# Patient Record
Sex: Female | Born: 1945 | Race: Black or African American | Hispanic: No | Smoking: Never smoker
Health system: Southern US, Community
[De-identification: ages and names within clinical notes are randomized; demographics above are authoritative.]

## PROBLEM LIST (undated history)

## (undated) DIAGNOSIS — K219 Gastro-esophageal reflux disease without esophagitis: Secondary | ICD-10-CM

## (undated) DIAGNOSIS — N189 Chronic kidney disease, unspecified: Secondary | ICD-10-CM

## (undated) DIAGNOSIS — I2510 Atherosclerotic heart disease of native coronary artery without angina pectoris: Secondary | ICD-10-CM

## (undated) DIAGNOSIS — E785 Hyperlipidemia, unspecified: Secondary | ICD-10-CM

## (undated) DIAGNOSIS — I10 Essential (primary) hypertension: Secondary | ICD-10-CM

## (undated) HISTORY — DX: Chronic kidney disease, unspecified: N18.9

## (undated) HISTORY — DX: Hyperlipidemia, unspecified: E78.5

## (undated) HISTORY — DX: Essential (primary) hypertension: I10

## (undated) HISTORY — PX: CARDIAC SURGERY: SHX584

## (undated) HISTORY — DX: Atherosclerotic heart disease of native coronary artery without angina pectoris: I25.10

---

## 2008-05-21 ENCOUNTER — Ambulatory Visit (HOSPITAL_COMMUNITY): Admission: RE | Admit: 2008-05-21 | Discharge: 2008-05-21 | Payer: Self-pay | Admitting: Nurse Practitioner

## 2008-05-21 ENCOUNTER — Ambulatory Visit: Payer: Self-pay | Admitting: Nurse Practitioner

## 2008-05-21 DIAGNOSIS — L299 Pruritus, unspecified: Secondary | ICD-10-CM | POA: Insufficient documentation

## 2008-05-21 DIAGNOSIS — L851 Acquired keratosis [keratoderma] palmaris et plantaris: Secondary | ICD-10-CM | POA: Insufficient documentation

## 2008-05-21 DIAGNOSIS — M5137 Other intervertebral disc degeneration, lumbosacral region: Secondary | ICD-10-CM | POA: Insufficient documentation

## 2008-05-21 LAB — CONVERTED CEMR LAB
Bilirubin Urine: NEGATIVE
Blood Glucose, Fingerstick: 547
Blood in Urine, dipstick: NEGATIVE
Glucose, Urine, Semiquant: >=1000
Ketones, urine, test strip: NEGATIVE
pH: 6

## 2008-05-22 ENCOUNTER — Ambulatory Visit: Payer: Self-pay | Admitting: *Deleted

## 2008-05-22 LAB — CONVERTED CEMR LAB
ALT: 14 units/L (ref 0–35)
AST: 13 units/L (ref 0–37)
Alkaline Phosphatase: 81 units/L (ref 39–117)
BUN: 15 mg/dL (ref 6–23)
CO2: 23 meq/L (ref 19–32)
Calcium: 9.6 mg/dL (ref 8.4–10.5)
Chloride: 87 meq/L — ABNORMAL LOW (ref 96–112)
Eosinophils Absolute: 0.3 10*3/uL (ref 0.0–0.7)
Glucose, Bld: 646 mg/dL (ref 70–99)
Lymphocytes Relative: 36 % (ref 12–46)
MCV: 76.9 fL — ABNORMAL LOW (ref 78.0–100.0)
Potassium: 4.6 meq/L (ref 3.5–5.3)
RBC: 5.33 M/uL — ABNORMAL HIGH (ref 3.87–5.11)
Sodium: 132 meq/L — ABNORMAL LOW (ref 135–145)
WBC: 5.5 10*3/uL (ref 4.0–10.5)

## 2008-06-08 ENCOUNTER — Ambulatory Visit: Payer: Self-pay | Admitting: Nurse Practitioner

## 2008-06-08 DIAGNOSIS — K5900 Constipation, unspecified: Secondary | ICD-10-CM | POA: Insufficient documentation

## 2008-06-08 LAB — CONVERTED CEMR LAB
Bilirubin Urine: NEGATIVE
Blood in Urine, dipstick: NEGATIVE
Ketones, urine, test strip: NEGATIVE
Nitrite: NEGATIVE
Protein, U semiquant: NEGATIVE
Specific Gravity, Urine: 1.02
Urobilinogen, UA: 0.2
WBC Urine, dipstick: NEGATIVE
pH: 5

## 2008-06-12 ENCOUNTER — Ambulatory Visit: Payer: Self-pay | Admitting: Nurse Practitioner

## 2008-06-15 ENCOUNTER — Encounter (INDEPENDENT_AMBULATORY_CARE_PROVIDER_SITE_OTHER): Payer: Self-pay | Admitting: Nurse Practitioner

## 2008-06-17 ENCOUNTER — Encounter (INDEPENDENT_AMBULATORY_CARE_PROVIDER_SITE_OTHER): Payer: Self-pay | Admitting: Nurse Practitioner

## 2008-07-09 ENCOUNTER — Ambulatory Visit: Payer: Self-pay | Admitting: Nurse Practitioner

## 2008-07-09 LAB — CONVERTED CEMR LAB: Blood Glucose, Fingerstick: 113

## 2008-07-10 ENCOUNTER — Encounter (INDEPENDENT_AMBULATORY_CARE_PROVIDER_SITE_OTHER): Payer: Self-pay | Admitting: Nurse Practitioner

## 2008-07-10 LAB — CONVERTED CEMR LAB
Cholesterol: 335 mg/dL — ABNORMAL HIGH (ref 0–200)
Triglycerides: 186 mg/dL — ABNORMAL HIGH (ref <150)

## 2008-07-24 ENCOUNTER — Encounter (INDEPENDENT_AMBULATORY_CARE_PROVIDER_SITE_OTHER): Payer: Self-pay | Admitting: Nurse Practitioner

## 2008-08-20 ENCOUNTER — Telehealth (INDEPENDENT_AMBULATORY_CARE_PROVIDER_SITE_OTHER): Payer: Self-pay | Admitting: Nurse Practitioner

## 2008-09-14 ENCOUNTER — Ambulatory Visit: Payer: Self-pay | Admitting: Nurse Practitioner

## 2008-09-14 DIAGNOSIS — I10 Essential (primary) hypertension: Secondary | ICD-10-CM | POA: Insufficient documentation

## 2008-09-14 DIAGNOSIS — E7800 Pure hypercholesterolemia, unspecified: Secondary | ICD-10-CM

## 2008-09-14 LAB — CONVERTED CEMR LAB
ALT: 15 units/L (ref 0–35)
AST: 16 units/L (ref 0–37)
Albumin: 4.5 g/dL (ref 3.5–5.2)
Alkaline Phosphatase: 64 units/L (ref 39–117)
Bilirubin, Direct: <0.1 mg/dL (ref 0.0–0.3)
Blood Glucose, Fingerstick: 91
Cholesterol, target level: 200 mg/dL
Cholesterol: 244 mg/dL — ABNORMAL HIGH (ref 0–200)
HDL goal, serum: 40 mg/dL
HDL: 63 mg/dL (ref >39)
Hgb A1c MFr Bld: 6.7 %
LDL Cholesterol: 152 mg/dL — ABNORMAL HIGH (ref 0–99)
LDL Goal: 100 mg/dL
Total Bilirubin: 0.6 mg/dL (ref 0.3–1.2)
Total CHOL/HDL Ratio: 3.9
Total Protein: 8.4 g/dL — ABNORMAL HIGH (ref 6.0–8.3)
Triglycerides: 143 mg/dL (ref <150)
VLDL: 29 mg/dL (ref 0–40)

## 2008-09-15 ENCOUNTER — Encounter (INDEPENDENT_AMBULATORY_CARE_PROVIDER_SITE_OTHER): Payer: Self-pay | Admitting: Nurse Practitioner

## 2008-09-22 ENCOUNTER — Encounter (INDEPENDENT_AMBULATORY_CARE_PROVIDER_SITE_OTHER): Payer: Self-pay | Admitting: Nurse Practitioner

## 2008-10-05 ENCOUNTER — Ambulatory Visit: Payer: Self-pay | Admitting: Nurse Practitioner

## 2008-11-26 ENCOUNTER — Ambulatory Visit: Payer: Self-pay | Admitting: Physician Assistant

## 2008-11-26 DIAGNOSIS — R011 Cardiac murmur, unspecified: Secondary | ICD-10-CM | POA: Insufficient documentation

## 2008-11-26 DIAGNOSIS — R3911 Hesitancy of micturition: Secondary | ICD-10-CM | POA: Insufficient documentation

## 2008-11-26 LAB — CONVERTED CEMR LAB
Glucose, Urine, Semiquant: NEGATIVE
Ketones, urine, test strip: NEGATIVE
Nitrite: NEGATIVE
Specific Gravity, Urine: <1.005
Urobilinogen, UA: 0.2
pH: 7

## 2008-12-30 ENCOUNTER — Ambulatory Visit: Payer: Self-pay | Admitting: Nurse Practitioner

## 2008-12-30 ENCOUNTER — Telehealth (INDEPENDENT_AMBULATORY_CARE_PROVIDER_SITE_OTHER): Payer: Self-pay | Admitting: Nurse Practitioner

## 2008-12-30 DIAGNOSIS — K219 Gastro-esophageal reflux disease without esophagitis: Secondary | ICD-10-CM | POA: Insufficient documentation

## 2008-12-30 LAB — CONVERTED CEMR LAB: Blood Glucose, AC Bkfst: 107 mg/dL

## 2009-01-07 ENCOUNTER — Ambulatory Visit (HOSPITAL_COMMUNITY): Admission: RE | Admit: 2009-01-07 | Discharge: 2009-01-07 | Payer: Self-pay | Admitting: Internal Medicine

## 2009-01-07 ENCOUNTER — Encounter (INDEPENDENT_AMBULATORY_CARE_PROVIDER_SITE_OTHER): Payer: Self-pay | Admitting: Internal Medicine

## 2009-01-15 ENCOUNTER — Encounter (INDEPENDENT_AMBULATORY_CARE_PROVIDER_SITE_OTHER): Payer: Self-pay | Admitting: *Deleted

## 2009-01-22 ENCOUNTER — Ambulatory Visit: Payer: Self-pay | Admitting: Nurse Practitioner

## 2009-01-22 DIAGNOSIS — E1169 Type 2 diabetes mellitus with other specified complication: Secondary | ICD-10-CM | POA: Insufficient documentation

## 2009-01-22 LAB — CONVERTED CEMR LAB
Blood Glucose, AC Bkfst: 74 mg/dL
Hgb A1c MFr Bld: 6.3 %

## 2009-01-25 DIAGNOSIS — A048 Other specified bacterial intestinal infections: Secondary | ICD-10-CM | POA: Insufficient documentation

## 2009-01-25 LAB — CONVERTED CEMR LAB
ALT: 16 units/L (ref 0–35)
Calcium: 9.8 mg/dL (ref 8.4–10.5)
Chloride: 101 meq/L (ref 96–112)
Creatinine, Ser: 0.87 mg/dL (ref 0.40–1.20)
Glucose, Bld: 77 mg/dL (ref 70–99)
VLDL: 22 mg/dL (ref 0–40)

## 2009-01-29 ENCOUNTER — Ambulatory Visit: Payer: Self-pay | Admitting: Nurse Practitioner

## 2009-04-23 ENCOUNTER — Ambulatory Visit: Payer: Self-pay | Admitting: Nurse Practitioner

## 2009-04-23 LAB — CONVERTED CEMR LAB: Hgb A1c MFr Bld: 5.8 %

## 2009-07-22 ENCOUNTER — Ambulatory Visit: Payer: Self-pay | Admitting: Nurse Practitioner

## 2009-07-22 LAB — CONVERTED CEMR LAB
Basophils Absolute: 0 10*3/uL (ref 0.0–0.1)
Basophils Relative: 1 % (ref 0–1)
Cholesterol: 208 mg/dL — ABNORMAL HIGH (ref 0–200)
Eosinophils Absolute: 0.7 10*3/uL (ref 0.0–0.7)
HCT: 36.6 % (ref 36.0–46.0)
Lymphocytes Relative: 52 % — ABNORMAL HIGH (ref 12–46)
MCV: 81 fL (ref 78.0–100.0)
Monocytes Absolute: 0.3 10*3/uL (ref 0.1–1.0)
Neutro Abs: 1.8 10*3/uL (ref 1.7–7.7)
RBC: 4.52 M/uL (ref 3.87–5.11)
RDW: 13.6 % (ref 11.5–15.5)
Triglycerides: 168 mg/dL — ABNORMAL HIGH (ref <150)
VLDL: 34 mg/dL (ref 0–40)

## 2009-07-23 ENCOUNTER — Encounter (INDEPENDENT_AMBULATORY_CARE_PROVIDER_SITE_OTHER): Payer: Self-pay | Admitting: Nurse Practitioner

## 2009-08-06 ENCOUNTER — Ambulatory Visit (HOSPITAL_COMMUNITY): Admission: RE | Admit: 2009-08-06 | Discharge: 2009-08-06 | Payer: Self-pay | Admitting: Internal Medicine

## 2009-10-22 ENCOUNTER — Ambulatory Visit: Payer: Self-pay | Admitting: Nurse Practitioner

## 2009-10-22 LAB — CONVERTED CEMR LAB: Hgb A1c MFr Bld: 7 %

## 2010-01-24 ENCOUNTER — Ambulatory Visit: Payer: Self-pay | Admitting: Nurse Practitioner

## 2010-01-24 LAB — CONVERTED CEMR LAB
Albumin: 4.6 g/dL (ref 3.5–5.2)
Basophils Relative: 0 % (ref 0–1)
Calcium: 9.2 mg/dL (ref 8.4–10.5)
Creatinine, Ser: 1.1 mg/dL (ref 0.40–1.20)
Eosinophils Relative: 18 % — ABNORMAL HIGH (ref 0–5)
Glucose, Bld: 91 mg/dL (ref 70–99)
HCT: 36.5 % (ref 36.0–46.0)
Hemoglobin: 11.7 g/dL — ABNORMAL LOW (ref 12.0–15.0)
Hgb A1c MFr Bld: 7 % — ABNORMAL HIGH (ref <5.7)
LDL Cholesterol: 126 mg/dL — ABNORMAL HIGH (ref 0–99)
Lymphocytes Relative: 55 % — ABNORMAL HIGH (ref 12–46)
Lymphs Abs: 2.9 10*3/uL (ref 0.7–4.0)
MCHC: 32.1 g/dL (ref 30.0–36.0)
MCV: 78.8 fL (ref 78.0–100.0)
Monocytes Relative: 2 % — ABNORMAL LOW (ref 3–12)
Platelets: 195 10*3/uL (ref 150–400)
RDW: 13.9 % (ref 11.5–15.5)
Total CHOL/HDL Ratio: 4.7
Total Protein: 7.6 g/dL (ref 6.0–8.3)
Triglycerides: 143 mg/dL (ref <150)
WBC: 5.3 10*3/uL (ref 4.0–10.5)

## 2010-01-25 ENCOUNTER — Encounter (INDEPENDENT_AMBULATORY_CARE_PROVIDER_SITE_OTHER): Payer: Self-pay | Admitting: Nurse Practitioner

## 2010-04-26 ENCOUNTER — Encounter (INDEPENDENT_AMBULATORY_CARE_PROVIDER_SITE_OTHER): Payer: Self-pay | Admitting: Nurse Practitioner

## 2010-04-26 ENCOUNTER — Ambulatory Visit: Payer: Self-pay | Admitting: Internal Medicine

## 2010-04-26 LAB — CONVERTED CEMR LAB
Bilirubin Urine: NEGATIVE
Blood in Urine, dipstick: NEGATIVE
Ketones, urine, test strip: NEGATIVE
Nitrite: NEGATIVE
Urobilinogen, UA: 0.2
pH: 6

## 2010-06-14 NOTE — Letter (Signed)
Summary: Lipid Letter  [facility]  [address] [place], [place] [address]   Phone: [phone]  Fax: [phone]    07/23/2009  Amanda [name] [address] [place], [place]  [address]  Dear Amanda [name]:  We have carefully reviewed your last lipid profile from 07/22/2009 and the results are noted below with a summary of recommendations for lipid management.    Cholesterol:       208     Goal: less than 200   HDL "good" Cholesterol:   50     Goal: greater than 40   LDL "bad" Cholesterol:   124     Goal: less than 70   Triglycerides:       168     Goal: less than 150     Your cholesterol labs are slightly elevated.  Continue to take your cholesterol medications.  Avoid fried fatty foods.  Drink low fat milk and choose other low fat options. Your Hgba1c =  _______.  This is slightly higher than when previously checked.  Keep taking your blood sugar medications as ordered.    Current Medications: 1)    Lac-hydrin 12 % Lotn (Ammonium lactate) .... Apply to affected skin topically two times a day 2)    Glucometer Elite Classic  Kit (Blood glucose monitoring suppl) .... Dispense 1 glucometer to check blood sugar twice daily 3)    Metformin Hcl 500 Mg Tabs (Metformin hcl) .... One tablet by mouth two times a day 4)    Glucotrol Xl 5 Mg Xr24h-tab (Glipizide) .... One tablet by mouth daily for blood sugar 5)    Gnp Test  Strp (Glucose blood) .... Check blood sugar twice daily 6)    Ibuprofen 600 Mg Tabs (Ibuprofen) .Marland Kitchen.. 1 tablet by mouth two times a day as needed for back pain 7)    Bayer Aspirin Ec Low Dose 81 Mg Tbec (Aspirin) .Marland Kitchen.. 1 tablet by mouth daily for circulation 8)    Lipitor 40 Mg Tabs (Atorvastatin calcium) .Marland Kitchen.. 1 tablet by mouth nightly for cholesterol 9)    Proventil Hfa 108 (90 Base) Mcg/act Aers (Albuterol sulfate) .... Two puffs every 6 hours as needed for shortness of breath 10)    Nexium 40 Mg Cpdr (Esomeprazole magnesium) .... One tablet by mouth daily  before breakfast 11)    Benazepril Hcl 10 Mg Tabs (Benazepril hcl) .... One tablet by mouth daily for blood pressure **pharmacy - d/c lisinopril**  If you have any questions, please call. We appreciate being able to work with you.   Sincerely,    [facility] Aurora Mask FNP

## 2010-06-14 NOTE — Letter (Signed)
Summary: TEST ORDER FORM//MAMMOGRAM//APPT DATE & TIME  TEST ORDER FORM//MAMMOGRAM//APPT DATE & TIME   Imported By: Roland Earl 09/21/2009 14:27:39  _____________________________________________________________________  External Attachment:    Type:   Image     Comment:   External Document

## 2010-06-14 NOTE — Assessment & Plan Note (Signed)
Summary: Diabetes   Vital Signs:  Patient profile:   65 year old female Menstrual status:  postmenopausal Weight:      119 pounds Temp:     97.3 degrees F oral BP sitting:   140 / 88  (left arm) Cuff size:   regular  Vitals Entered By: Mordecai Maes CMA (January 24, 2010 8:40 AM) CC: pt here 3 mo f/u(fasting) medications reviewed///////, Hypertension Management, Lipid Management, Abdominal Pain Is Patient Diabetic? Yes CBG Result 115 CBG Device ID [id number]  Does patient need assistance? Functional Status Self care Ambulation Normal     Menstrual Status postmenopausal Last PAP Result refused  Diabetic Foot Exam Foot Inspection Is there a foot ulcer now?              No Can the patient see the bottom of their feet?          Yes Are the shoes appropriate in style and fit?          Yes Is there swelling or an abnormal foot shape?          No Are the toenails long?                No Are the toenails thick?                No Are the toenails ingrown?              No Is there heavy callous build-up?              No Is there pain in the calf muscle (Intermittent claudication) when walking?    NoIs there a claw toe deformity?              No Is there elevated skin temperature?            No Is there limited ankle dorsiflexion?            No Is there foot or ankle muscle weakness?            No  Diabetic Foot Care Education Patient educated on appropriate care of diabetic feet.  Pulse Check          Right Foot          Left Foot Dorsalis Pedis:        normal            normal    CC:  pt here 3 mo f/u(fasting) medications reviewed///////, Hypertension Management, Lipid Management, and Abdominal Pain.  History of Present Illness:   Pt into the office today for f/u on diabetes.  Son present today with pt who serves as interpreter Pt presents today with all her medications  Diabetes Management History:      The patient is a 65 years old female who comes in for evaluation of  Type 2 Diabetes Mellitus.  She has not been enrolled in the "Diabetic Education Program".  She states understanding of dietary principles and is following her diet appropriately.  No sensory loss is reported.  Self foot exams are being performed.  She is checking home blood sugars.  She says that she is not exercising regularly.        Hypoglycemic symptoms are not occurring.  No hyperglycemic symptoms are reported.        There are no symptoms to suggest diabetic complications.  No changes have been made to her treatment plan since last visit.    Dyspepsia History:  She has no alarm features of dyspepsia including no history of melena, hematochezia, dysphagia, persistent vomiting, or involuntary weight loss > 5%.  There is a prior history of GERD.  The patient does not have a prior history of documented ulcer disease.  The dominant symptom is heartburn or acid reflux.  She has no history of a positive H. Pylori serology.  No previous upper endoscopy has been done.    Hypertension History:      She denies headache, chest pain, and palpitations.  She notes no problems with any antihypertensive medication side effects.  Pt was started on Benicar 5mg  during the last visit.  She has already taken already today. Son reports that BP has been slightly elevated since starting the benicar.        Positive major cardiovascular risk factors include female age 34 years old or older, diabetes, hyperlipidemia, and hypertension.  Negative major cardiovascular risk factors include non-tobacco-user status.        Further assessment for target organ damage reveals no history of ASHD, cardiac end-organ damage (CHF/LVH), stroke/TIA, peripheral vascular disease, renal insufficiency, or hypertensive retinopathy.    Lipid Management History:      Positive NCEP/ATP III risk factors include female age 36 years old or older, diabetes, and hypertension.  Negative NCEP/ATP III risk factors include no history of early menopause  without estrogen hormone replacement, non-tobacco-user status, no ASHD (atherosclerotic heart disease), no prior stroke/TIA, no peripheral vascular disease, and no history of aortic aneurysm.        The patient states that she knows about the "Therapeutic Lifestyle Change" diet.  Her compliance with the TLC diet is fair.  The patient does not know about adjunctive measures for cholesterol lowering.  Adjunctive measures started by the patient include folic acid.  She expresses no side effects from her lipid-lowering medication.  The patient denies any symptoms to suggest myopathy or liver disease.     Habits & Providers  Alcohol-Tobacco-Diet     Alcohol drinks/day: 0     Tobacco Status: never  Exercise-Depression-Behavior     Does Patient Exercise: no     Depression Counseling: not indicated; screening negative for depression     Drug Use: no     Seat Belt Use: 100     Sun Exposure: occasionally  Current Medications (verified): 1)  Lac-Hydrin 12 % Lotn (Ammonium Lactate) .... Apply To Affected Skin Topically Two Times A Day 2)  Glucometer Elite Classic  Kit (Blood Glucose Monitoring Suppl) .... Dispense 1 Glucometer To Check Blood Sugar Twice Daily 3)  Metformin Hcl 500 Mg Tabs (Metformin Hcl) .... One Tablet By Mouth Three Times A Day For Diabetes 4)  Glucotrol Xl 5 Mg Xr24h-Tab (Glipizide) .... One Tablet By Mouth Daily For Blood Sugar 5)  Gnp Test  Strp (Glucose Blood) .... Check Blood Sugar Twice Daily 6)  Ibuprofen 600 Mg Tabs (Ibuprofen) .Marland Kitchen.. 1 Tablet By Mouth Two Times A Day As Needed For Back Pain 7)  Bayer Aspirin Ec Low Dose 81 Mg Tbec (Aspirin) .Marland Kitchen.. 1 Tablet By Mouth Daily For Circulation 8)  Lipitor 40 Mg Tabs (Atorvastatin Calcium) .Marland Kitchen.. 1 Tablet By Mouth Nightly For Cholesterol 9)  Proventil Hfa 108 (90 Base) Mcg/act Aers (Albuterol Sulfate) .... Two Puffs Every 6 Hours As Needed For Shortness of Breath 10)  Nexium 40 Mg Cpdr (Esomeprazole Magnesium) .... One Tablet By Mouth  Daily Before Breakfast 11)  Benicar 20 Mg Tabs (Olmesartan Medoxomil) .... One Tablet By Mouth Daily  For Blood Pressure  Allergies: No Known Drug Allergies  Review of Systems CV:  Denies chest pain or discomfort. Resp:  Denies cough; ACE stopped during last visit and cough has improved. GI:  Complains of constipation; denies abdominal pain, nausea, and vomiting; pt took metamucil previously with good results. admits that she sporatically eats high fiber foods.  Physical Exam  General:  alert.   Head:  normocephalic.   Lungs:  normal breath sounds.   Heart:  normal rate, regular rhythm, and grade  3/6 HSM.   Abdomen:  non-tender.   Msk:  up to the exam table Neurologic:  alert & oriented X3.   Psych:  Oriented X3.    Diabetes Management Exam:       Nails:          Left foot: normal          Right foot: normal   Impression & Recommendations:  Problem # 1:  DIABETES MELLITUS (ICD-250.00) will check Hbga1c today (send out) Her updated medication list for this problem includes:    Metformin Hcl 500 Mg Tabs (Metformin hcl) ..... One tablet by mouth three times a day for diabetes    Glucotrol Xl 5 Mg Xr24h-tab (Glipizide) ..... One tablet by mouth daily for blood sugar    Bayer Aspirin Ec Low Dose 81 Mg Tbec (Aspirin) .Marland Kitchen... 1 tablet by mouth daily for circulation    Benicar 20 Mg Tabs (Olmesartan medoxomil) ..... One tablet by mouth daily for blood pressure  Orders: Capillary Blood Glucose/CBG (82948) T- Hemoglobin A1C [id number])  Problem # 2:  HYPERTENSION, BENIGN ESSENTIAL (ICD-401.1) BP is still slightly elevated  will increase the dose to 20mg  by mouth daily Her updated medication list for this problem includes:    Benicar 20 Mg Tabs (Olmesartan medoxomil) ..... One tablet by mouth daily for blood pressure  Orders: T-Comprehensive Metabolic Panel (A999333) T-CBC w/Diff [id number])  Problem # 3:  HYPERCHOLESTEROLEMIA (ICD-272.0) will check labs today Her  updated medication list for this problem includes:    Lipitor 40 Mg Tabs (Atorvastatin calcium) .Marland Kitchen... 1 tablet by mouth nightly for cholesterol  Orders: T-Lipid Profile [id number])  Problem # 4:  COUGH (ICD-786.2) has improved with d/c of ACE  Problem # 5:  CONSTIPATION (ICD-564.00) advised high fiber diet water and exercise  Complete Medication List: 1)  Lac-hydrin 12 % Lotn (Ammonium lactate) .... Apply to affected skin topically two times a day 2)  Glucometer Elite Classic Kit (Blood glucose monitoring suppl) .... Dispense 1 glucometer to check blood sugar twice daily 3)  Metformin Hcl 500 Mg Tabs (Metformin hcl) .... One tablet by mouth three times a day for diabetes 4)  Glucotrol Xl 5 Mg Xr24h-tab (Glipizide) .... One tablet by mouth daily for blood sugar 5)  Gnp Test Strp (Glucose blood) .... Check blood sugar twice daily 6)  Ibuprofen 600 Mg Tabs (Ibuprofen) .Marland Kitchen.. 1 tablet by mouth two times a day as needed for back pain 7)  Bayer Aspirin Ec Low Dose 81 Mg Tbec (Aspirin) .Marland Kitchen.. 1 tablet by mouth daily for circulation 8)  Lipitor 40 Mg Tabs (Atorvastatin calcium) .Marland Kitchen.. 1 tablet by mouth nightly for cholesterol 9)  Proventil Hfa 108 (90 Base) Mcg/act Aers (Albuterol sulfate) .... Two puffs every 6 hours as needed for shortness of breath 10)  Nexium 40 Mg Cpdr (Esomeprazole magnesium) .... One tablet by mouth daily before breakfast 11)  Benicar 20 Mg Tabs (Olmesartan medoxomil) .... One tablet by mouth daily for blood pressure  Diabetes Management Assessment/Plan:      The following lipid goals have been established for the patient: Total cholesterol goal of 200; LDL cholesterol goal of 100; HDL cholesterol goal of 40; Triglyceride goal of 150.  Her blood pressure goal is < 130/80.    Dyspepsia Assessment/Plan:  Step Therapy: GERD Treatment Protocols:    Step-1: started  Hypertension Assessment/Plan:      The patient's hypertensive risk group is category C: Target organ damage  and/or diabetes.  Her calculated 10 year risk of coronary heart disease is 24 %.  Today's blood pressure is 140/88.  Her blood pressure goal is < 130/80.  Lipid Assessment/Plan:      Based on NCEP/ATP III, the patient's risk factor category is "history of diabetes".  The patient's lipid goals are as follows: Total cholesterol goal is 200; LDL cholesterol goal is 100; HDL cholesterol goal is 40; Triglyceride goal is 150.  Her LDL cholesterol goal has not been met.  Secondary causes for hyperlipidemia have been ruled out.  She has been counseled on adjunctive measures for lowering her cholesterol.    Diabetic Foot Exam Foot Inspection Is there a foot ulcer now?              No Can the patient see the bottom of their feet?          Yes Are the shoes appropriate in style and fit?          Yes Is there swelling or an abnormal foot shape?          No Are the toenails long?                No Are the toenails thick?                No Are the toenails ingrown?              No Is there heavy callous build-up?              No Is there pain in the calf muscle (Intermittent claudication) when walking?    NoIs there a claw toe deformity?              No Is there elevated skin temperature?            No Is there limited ankle dorsiflexion?            No Is there foot or ankle muscle weakness?            No  Diabetic Foot Care Education Patient educated on appropriate care of diabetic feet.  Pulse Check          Right Foot          Left Foot Dorsalis Pedis:        normal            normal     Patient Instructions: 1)  [place] a nurse visit in 6 weeks for a flu vaccine. 2)  Schedule a follow up visit in 3 months with n.martin,fnp for diabetes.   3)  Your labs will be checked today and you will be notified of the results 4)  constipation - you may need to start a stool softner daily.  However try to add more high fiber foods to your diet such as raisins, prunes, oatmeal, apples,  bran. Prescriptions: NEXIUM 40 MG CPDR (ESOMEPRAZOLE MAGNESIUM) One tablet by mouth daily before breakfast  #30 x 5   Entered  and Authorized by:   Aurora Mask FNP   Signed by:   Aurora Mask FNP on 01/24/2010   Method used:   Faxed to ...       [facility] (retail)       [place], [place]  [address]       Ph: [phone] [place]       Fax: [phone]   RxID:   [id number] GNP TEST  STRP (GLUCOSE BLOOD) check blood sugar twice daily  #100 x 5   Entered and Authorized by:   Aurora Mask FNP   Signed by:   Aurora Mask FNP on 01/24/2010   Method used:   Faxed to ...       [facility] (retail)       [place], [place]  [address]       Ph: [phone] [place]       Fax: [phone]   RxID:   [id number] BENICAR 20 MG TABS (OLMESARTAN MEDOXOMIL) One tablet by mouth daily for blood pressure  #30 x 5   Entered and Authorized by:   Aurora Mask FNP   Signed by:   Aurora Mask FNP on 01/24/2010   Method used:   Faxed to ...       [facility] (retail)       [place], [place]  [address]       Ph: [phone] [place]       Fax: [phone]   RxID:   [id number]

## 2010-06-14 NOTE — Assessment & Plan Note (Signed)
Summary: Diabetes   Vital Signs:  Patient profile:   65 year old female Height:      56 inches Weight:      117 pounds BMI:     26.33 Temp:     98.2 degrees F oral Pulse rate:   73 / minute Pulse rhythm:   regular Resp:     18 per minute BP sitting:   135 / 84  (left arm) Cuff size:   regular  Vitals Entered By: [place] Shannon (October 22, 2009 8:19 AM)  Nutrition Counseling: Patient's BMI is greater than 25 and therefore counseled on weight management options. CC: three month f/u..., Hypertension Management, Lipid Management, Abdominal Pain Is Patient Diabetic? Yes Pain Assessment Patient in pain? no      CBG Result 115  Does patient need assistance? Functional Status Self care Ambulation Normal     Last PAP Result refused   CC:  three month f/u..., Hypertension Management, Lipid Management, and Abdominal Pain.  History of Present Illness:  Pt into the office for 3 month f/u - diabetes  All medications into the office today with pt and reviewed with son.  Son present with pt to interpret    Diabetes Management History:      The patient is a 66 years old female who comes in for evaluation of Type 2 Diabetes Mellitus.  She has not been enrolled in the "Diabetic Education Program".  She states lack of understanding of dietary principles and is not following her diet appropriately.  No sensory loss is reported.  Self foot exams are not being performed.  She is checking home blood sugars.  She says that she is not exercising regularly.        Hypoglycemic symptoms are not occurring.  No hyperglycemic symptoms are reported.  Other comments include: son is checking her blood sugar daily at home and realizes that she has some elevated blood sugars at home from time to time.  Marland Kitchen        No changes have been made to her treatment plan since last visit.    Dyspepsia History:      There is a prior history of GERD.  The patient does not have a prior history of documented ulcer  disease.  She has no history of a positive H. Pylori serology.  No previous upper endoscopy has been done.    Hypertension History:      She denies headache, chest pain, and palpitations.        Positive major cardiovascular risk factors include female age 21 years old or older, diabetes, hyperlipidemia, and hypertension.  Negative major cardiovascular risk factors include non-tobacco-user status.        Further assessment for target organ damage reveals no history of ASHD, cardiac end-organ damage (CHF/LVH), stroke/TIA, peripheral vascular disease, renal insufficiency, or hypertensive retinopathy.    Lipid Management History:      Positive NCEP/ATP III risk factors include female age 53 years old or older, diabetes, and hypertension.  Negative NCEP/ATP III risk factors include non-tobacco-user status, no ASHD (atherosclerotic heart disease), no prior stroke/TIA, no peripheral vascular disease, and no history of aortic aneurysm.        The patient states that she knows about the "Therapeutic Lifestyle Change" diet.  Her compliance with the TLC diet is fair.  She expresses no side effects from her lipid-lowering medication.  Comments include: pt is taking lipitor as ordered.  The patient denies any symptoms to suggest myopathy or  liver disease.       Habits & Providers  Alcohol-Tobacco-Diet     Alcohol drinks/day: 0     Tobacco Status: never  Exercise-Depression-Behavior     Does Patient Exercise: no     Have you felt down or hopeless? no     Have you felt little pleasure in things? no     Depression Counseling: not indicated; screening negative for depression     Drug Use: no     Seat Belt Use: 100     Sun Exposure: occasionally  Current Medications (verified): 1)  Lac-Hydrin 12 % Lotn (Ammonium Lactate) .... Apply To Affected Skin Topically Two Times A Day 2)  Glucometer Elite Classic  Kit (Blood Glucose Monitoring Suppl) .... Dispense 1 Glucometer To Check Blood Sugar Twice Daily 3)   Metformin Hcl 500 Mg Tabs (Metformin Hcl) .... One Tablet By Mouth Three Times A Day For Diabetes 4)  Glucotrol Xl 5 Mg Xr24h-Tab (Glipizide) .... One Tablet By Mouth Daily For Blood Sugar 5)  Gnp Test  Strp (Glucose Blood) .... Check Blood Sugar Twice Daily 6)  Ibuprofen 600 Mg Tabs (Ibuprofen) .Marland Kitchen.. 1 Tablet By Mouth Two Times A Day As Needed For Back Pain 7)  Bayer Aspirin Ec Low Dose 81 Mg Tbec (Aspirin) .Marland Kitchen.. 1 Tablet By Mouth Daily For Circulation 8)  Lipitor 40 Mg Tabs (Atorvastatin Calcium) .Marland Kitchen.. 1 Tablet By Mouth Nightly For Cholesterol 9)  Proventil Hfa 108 (90 Base) Mcg/act Aers (Albuterol Sulfate) .... Two Puffs Every 6 Hours As Needed For Shortness of Breath 10)  Nexium 40 Mg Cpdr (Esomeprazole Magnesium) .... One Tablet By Mouth Daily Before Breakfast 11)  Benicar 5 Mg Tabs (Olmesartan Medoxomil) .... One Tablet By Mouth Daily For Kidneys/blood Pressure **d/c Ace Inhibitors - Cough**  Allergies (verified): No Known Drug Allergies  Review of Systems CV:  Denies chest pain or discomfort. Resp:  Complains of cough; Pt still has a cough despite changing her from lisinopril to benazepril. Non-productive pt has tried meds for allergies and GERD without resolution of cough. GI:  Denies abdominal pain, nausea, and vomiting. MS:  Complains of low back pain.  Physical Exam  General:  alert.   Head:  normocephalic.   Ears:  ear piercing(s) noted.   Lungs:  normal breath sounds.   Heart:  normal rate and regular rhythm.   Abdomen:  normal bowel sounds.   Neurologic:  alert & oriented X3.    Diabetes Management Exam:       Nails:          Left foot: thickened          Right foot: thickened   Impression & Recommendations:  Problem # 1:  DIABETES MELLITUS (ICD-250.00) Hgba1c = 7.0 Son has noticed some increase in blood sugars at home so will increase metformin to three times a day  Pt also continues to have cough - will change from ACE as pt has tried and failed on two. Will  transition to benicar (rx sent to [facility]) The following medications were removed from the medication list:    Benazepril Hcl 10 Mg Tabs (Benazepril hcl) ..... One tablet by mouth daily for blood pressure **pharmacy - d/c lisinopril** Her updated medication list for this problem includes:    Metformin Hcl 500 Mg Tabs (Metformin hcl) ..... One tablet by mouth three times a day for diabetes    Glucotrol Xl 5 Mg Xr24h-tab (Glipizide) ..... One tablet by mouth daily for blood sugar  Bayer Aspirin Ec Low Dose 81 Mg Tbec (Aspirin) .Marland Kitchen... 1 tablet by mouth daily for circulation    Benicar 5 Mg Tabs (Olmesartan medoxomil) ..... One tablet by mouth daily for kidneys/blood pressure **d/c ace inhibitors - cough**  Orders: Capillary Blood Glucose/CBG (82948) Hemoglobin A1C (83036)  Problem # 2:  HYPERTENSION, BENIGN ESSENTIAL (ICD-401.1) BP is still slightly elevated but ? cough due ACE will change to benicar The following medications were removed from the medication list:    Benazepril Hcl 10 Mg Tabs (Benazepril hcl) ..... One tablet by mouth daily for blood pressure **pharmacy - d/c lisinopril** Her updated medication list for this problem includes:    Benicar 5 Mg Tabs (Olmesartan medoxomil) ..... One tablet by mouth daily for kidneys/blood pressure **d/c ace inhibitors - cough**  Problem # 3:  HYPERCHOLESTEROLEMIA (ICD-272.0) will check on next visit Her updated medication list for this problem includes:    Lipitor 40 Mg Tabs (Atorvastatin calcium) .Marland Kitchen... 1 tablet by mouth nightly for cholesterol  Problem # 4:  GERD (ICD-530.81) stable at this time Her updated medication list for this problem includes:    Nexium 40 Mg Cpdr (Esomeprazole magnesium) ..... One tablet by mouth daily before breakfast  Complete Medication List: 1)  Lac-hydrin 12 % Lotn (Ammonium lactate) .... Apply to affected skin topically two times a day 2)  Glucometer Elite Classic Kit (Blood glucose monitoring suppl)  .... Dispense 1 glucometer to check blood sugar twice daily 3)  Metformin Hcl 500 Mg Tabs (Metformin hcl) .... One tablet by mouth three times a day for diabetes 4)  Glucotrol Xl 5 Mg Xr24h-tab (Glipizide) .... One tablet by mouth daily for blood sugar 5)  Gnp Test Strp (Glucose blood) .... Check blood sugar twice daily 6)  Ibuprofen 600 Mg Tabs (Ibuprofen) .Marland Kitchen.. 1 tablet by mouth two times a day as needed for back pain 7)  Bayer Aspirin Ec Low Dose 81 Mg Tbec (Aspirin) .Marland Kitchen.. 1 tablet by mouth daily for circulation 8)  Lipitor 40 Mg Tabs (Atorvastatin calcium) .Marland Kitchen.. 1 tablet by mouth nightly for cholesterol 9)  Proventil Hfa 108 (90 Base) Mcg/act Aers (Albuterol sulfate) .... Two puffs every 6 hours as needed for shortness of breath 10)  Nexium 40 Mg Cpdr (Esomeprazole magnesium) .... One tablet by mouth daily before breakfast 11)  Benicar 5 Mg Tabs (Olmesartan medoxomil) .... One tablet by mouth daily for kidneys/blood pressure **d/c ace inhibitors - cough**  Diabetes Management Assessment/Plan:      The following lipid goals have been established for the patient: Total cholesterol goal of 200; LDL cholesterol goal of 100; HDL cholesterol goal of 40; Triglyceride goal of 150.  Her blood pressure goal is < 130/80.    Dyspepsia Assessment/Plan:  Step Therapy: GERD Treatment Protocols:    Step-1: started  Hypertension Assessment/Plan:      The patient's hypertensive risk group is category C: Target organ damage and/or diabetes.  Her calculated 10 year risk of coronary heart disease is 17 %.  Today's blood pressure is 135/84.  Her blood pressure goal is < 130/80.  Lipid Assessment/Plan:      Based on NCEP/ATP III, the patient's risk factor category is "history of diabetes".  The patient's lipid goals are as follows: Total cholesterol goal is 200; LDL cholesterol goal is 100; HDL cholesterol goal is 40; Triglyceride goal is 150.  Her LDL cholesterol goal has not been met.     Patient  Instructions: 1)  cough - may be due to  the class of blood pressure medications. 2)  Stop the benazepril once new medication is available. 3)  Start benicar 5mg  by mouth daily (new prescription sent to the pharmacy by this provider) 4)  Diabetes - increase metformin to 500mg  by mouth three times a day 5)  Check blood sugar at least once per day with the increased dose  6)  Follow up in 3 months for diabetes or sooner if necessary 7)  Come fasting after midnight for labs. 8)  will need cbc, cmp, lipids. Prescriptions: LAC-HYDRIN 12 % LOTN (AMMONIUM LACTATE) apply to affected skin topically two times a day  #28ml x 0   Entered and Authorized by:   Aurora Mask FNP   Signed by:   Aurora Mask FNP on 10/22/2009   Method used:   Faxed to ...       [facility] (retail)       [place], [place]  [address]       Ph: [phone] [place]       Fax: [phone]   RxID:   [id number] LIPITOR 40 MG TABS (ATORVASTATIN CALCIUM) 1 tablet by mouth nightly for cholesterol  #30 x 5   Entered and Authorized by:   Aurora Mask FNP   Signed by:   Aurora Mask FNP on 10/22/2009   Method used:   Faxed to ...       [facility] (retail)       [place], [place]  [address]       Ph: [phone] [place]       Fax: [phone]   RxID:   [id number] METFORMIN HCL 500 MG TABS (METFORMIN HCL) One tablet by mouth three times a day for diabetes  #90 x 5   Entered and Authorized by:   Aurora Mask FNP   Signed by:   Aurora Mask FNP on 10/22/2009   Method used:   Faxed to ...       [facility] (retail)       [place], [place]  [address]       Ph: [phone]       Fax: [phone]   RxID:   [id number] BENICAR 5 MG TABS (OLMESARTAN MEDOXOMIL) One tablet by mouth daily for kidneys/blood pressure **D/C ACE inhibitors -  cough**  #30 x 5   Entered and Authorized by:   Aurora Mask FNP   Signed by:   Aurora Mask FNP on 10/22/2009   Method used:   Faxed to ...       [facility] (retail)       [place], [place]  [address]       Ph: [phone]       Fax: [phone]   RxID:   [id number]   Diabetic Foot Exam Foot Inspection Is there a history of a foot ulcer?              Yes Is there a foot ulcer now?              No Can the patient see the bottom of their feet?          Yes Are the shoes appropriate in style and fit?  Yes Is there swelling or an abnormal foot shape?          No Are the toenails long?                No Are the toenails thick?                Yes Are the toenails ingrown?              No Is there heavy callous build-up?              No Is there pain in the calf muscle (Intermittent claudication) when walking?    NoIs there a claw toe deformity?              No Is there elevated skin temperature?            No Is there limited ankle dorsiflexion?            No Is there foot or ankle muscle weakness?            No  Diabetic Foot Care Education Patient educated on appropriate care of diabetic feet.    Laboratory Results   Blood Tests   Date/Time Received: October 22, 2009 8:32 AM   HGBA1C: 7.0%   (Normal Range: Non-Diabetic - 3-6%   Control Diabetic - 6-8%) CBG Random:: 115mg /dL     Prevention & Chronic Care Immunizations   Influenza vaccine: Fluvax 3+  (04/23/2009)    Tetanus booster: 11/06/2007: historical    Pneumococcal vaccine: Pneumovax  (06/08/2008)    H. zoster vaccine: Not documented  Colorectal Screening   Hemoccult: Not documented    Colonoscopy: Not documented  Other Screening   Pap smear: refused  (10/22/2009)    Mammogram: ASSESSMENT: Negative - BI-RADS 1^MM DIGITAL SCREENING  (08/06/2009)    DXA bone density scan: Not documented   Smoking status: never   (10/22/2009)  Diabetes Mellitus   HgbA1C: 7.0  (10/22/2009)    Eye exam: normal  (09/22/2008)    Foot exam: yes  (07/22/2009)   High risk foot: Yes  (01/22/2009)   Foot care education: Done  (10/22/2009)   Foot exam due: 07/25/2010    Urine microalbumin/creatinine ratio: Not documented  Lipids   Total Cholesterol: 208  (07/22/2009)   LDL: 124  (07/22/2009)   LDL Direct: Not documented   HDL: 50  (07/22/2009)   Triglycerides: 168  (07/22/2009)    SGOT (AST): 18  (01/22/2009)   SGPT (ALT): 16  (01/22/2009)   Alkaline phosphatase: 54  (01/22/2009)   Total bilirubin: 0.7  (01/22/2009)  Hypertension   Last Blood Pressure: 135 / 84  (10/22/2009)   Serum creatinine: 0.87  (01/22/2009)   Serum potassium 4.6  (01/22/2009)  Self-Management Support :    Diabetes self-management support: Not documented    Hypertension self-management support: Not documented    Lipid self-management support: Not documented

## 2010-06-14 NOTE — Letter (Signed)
Summary: Lipid Letter  [facility]  [address] [place], [place] [address]   Phone: [phone]  Fax: [phone]    01/25/2010  Amanda [name] [address] [place],   [address]  Dear Ms. Zellmer:  We have carefully reviewed your last lipid profile from 01/24/2010 and the results are noted below with a summary of recommendations for lipid management.    Cholesterol:       197     Goal: less than 200   HDL "good" Cholesterol:   42     Goal: less than 40   LDL "bad" Cholesterol:   126     Goal: less than 70   Triglycerides:       143     Goal: less than 150  Labs done during recent office visit shows that your BAD cholesterol is still slightly elevated.  No change in medications at this time, just be monitor the amount of fried fatty foods that you eat.  Your Hgba1c = 7 which means your diabetes is doing ok.  Continue all your current medications.     Current Medications: 1)    Lac-hydrin 12 % Lotn (Ammonium lactate) .... Apply to affected skin topically two times a day 2)    Glucometer Elite Classic  Kit (Blood glucose monitoring suppl) .... Dispense 1 glucometer to check blood sugar twice daily 3)    Metformin Hcl 500 Mg Tabs (Metformin hcl) .... One tablet by mouth three times a day for diabetes 4)    Glucotrol Xl 5 Mg Xr24h-tab (Glipizide) .... One tablet by mouth daily for blood sugar 5)    Gnp Test  Strp (Glucose blood) .... Check blood sugar twice daily 6)    Ibuprofen 600 Mg Tabs (Ibuprofen) .Marland Kitchen.. 1 tablet by mouth two times a day as needed for back pain 7)    Bayer Aspirin Ec Low Dose 81 Mg Tbec (Aspirin) .Marland Kitchen.. 1 tablet by mouth daily for circulation 8)    Lipitor 40 Mg Tabs (Atorvastatin calcium) .Marland Kitchen.. 1 tablet by mouth nightly for cholesterol 9)    Proventil Hfa 108 (90 Base) Mcg/act Aers (Albuterol sulfate) .... Two puffs every 6 hours as needed for shortness of breath 10)    Nexium 40 Mg Cpdr (Esomeprazole magnesium) .... One  tablet by mouth daily before breakfast 11)    Benicar 20 Mg Tabs (Olmesartan medoxomil) .... One tablet by mouth daily for blood pressure  If you have any questions, please call. We appreciate being able to work with you.   Sincerely,    [facility] Aurora Mask FNP

## 2010-06-14 NOTE — Assessment & Plan Note (Signed)
Summary: Diabetes/HTN   Vital Signs:  Patient profile:   65 year old female Weight:      114 pounds Temp:     98.0 degrees F Pulse rhythm:   regular Resp:     20 per minute BP sitting:   138 / 83  (left arm) Cuff size:   regular  Vitals Entered By: Shellia Carwin CMA (July 22, 2009 8:33 AM) CC: 3 month f/u on her dm and lab work, pt is fasting, also wants test results from last year., Hypertension Management, Abdominal Pain Is Patient Diabetic? Yes Pain Assessment Patient in pain? yes     Location: waist Intensity: 6 CBG Result 74  Does patient need assistance? Ambulation Normal   CC:  3 month f/u on her dm and lab work, pt is fasting, also wants test results from last year., Hypertension Management, and Abdominal Pain.  History of Present Illness:  Pt into the office for diabetes follow up. Pt has all her medications present today in office except her cholesterol medications because she takes it at night.  Diabetes Management History:      The patient is a 65 years old female who comes in for evaluation of Type 2 Diabetes Mellitus.  She has not been enrolled in the "Diabetic Education Program".  She states understanding of dietary principles and is following her diet appropriately.  No sensory loss is reported.  Self foot exams are not being performed.  She is checking home blood sugars.  She says that she is not exercising regularly.        Hypoglycemic symptoms are not occurring.  No hyperglycemic symptoms are reported.        No changes have been made to her treatment plan since last visit.    Dyspepsia History:      There is a prior history of GERD.  The patient does not have a prior history of documented ulcer disease.  The dominant symptom is not heartburn or acid reflux.  An H-2 blocker medication is not currently being taken.  She has no history of a positive H. Pylori serology.  No previous upper endoscopy has been done.    Hypertension History:      She denies  headache, chest pain, and palpitations.  She notes the following problems with antihypertensive medication side effects: cough - present intermittently for the past 3-6 months.  Advised son/pt that is cough persists during this visit then would change BP meds.        Positive major cardiovascular risk factors include female age 19 years old or older, diabetes, hyperlipidemia, and hypertension.  Negative major cardiovascular risk factors include non-tobacco-user status.        Further assessment for target organ damage reveals no history of ASHD, cardiac end-organ damage (CHF/LVH), stroke/TIA, peripheral vascular disease, renal insufficiency, or hypertensive retinopathy.       Habits & Providers  Alcohol-Tobacco-Diet     Alcohol drinks/day: 0     Tobacco Status: never  Exercise-Depression-Behavior     Does Patient Exercise: no     Have you felt down or hopeless? no     Have you felt little pleasure in things? no     Depression Counseling: not indicated; screening negative for depression     Drug Use: no     Seat Belt Use: 100     Sun Exposure: occasionally  Current Medications (verified): 1)  Lac-Hydrin 12 % Lotn (Ammonium Lactate) .... Apply To Affected Skin Topically Two Times  A Day 2)  Glucometer Elite Classic  Kit (Blood Glucose Monitoring Suppl) .... Dispense 1 Glucometer To Check Blood Sugar Twice Daily 3)  Metformin Hcl 500 Mg Tabs (Metformin Hcl) .... One Tablet By Mouth Two Times A Day 4)  Glucotrol Xl 5 Mg Xr24h-Tab (Glipizide) .... One Tablet By Mouth Daily For Blood Sugar 5)  Gnp Test  Strp (Glucose Blood) .... Check Blood Sugar Twice Daily 6)  Ibuprofen 600 Mg Tabs (Ibuprofen) .Marland Kitchen.. 1 Tablet By Mouth Two Times A Day As Needed For Back Pain 7)  Bayer Aspirin Ec Low Dose 81 Mg Tbec (Aspirin) .Marland Kitchen.. 1 Tablet By Mouth Daily For Circulation 8)  Lipitor 40 Mg Tabs (Atorvastatin Calcium) .Marland Kitchen.. 1 Tablet By Mouth Nightly For Cholesterol 9)  Lisinopril 10 Mg Tabs (Lisinopril) .Marland Kitchen.. 1  Tablet By Mouth For Blood Pressure 10)  Proventil Hfa 108 (90 Base) Mcg/act Aers (Albuterol Sulfate) .... Two Puffs Every 6 Hours As Needed For Shortness of Breath 11)  Nexium 40 Mg Cpdr (Esomeprazole Magnesium) .... One Tablet By Mouth Daily Before Breakfast  Allergies (verified): No Known Drug Allergies  Review of Systems CV:  Denies chest pain or discomfort. Resp:  Complains of cough. GI:  Complains of constipation; constipation still persists - is not taking the miralax daily.  Son has given her some over the counter laxatives to induce BM.  Once weekly BM.  Physical Exam  General:  alert.   Head:  normocephalic.   Lungs:  normal breath sounds.   Heart:  normal rate and regular rhythm.   Msk:  up to the exam table Neurologic:  alert & oriented X3.    Diabetes Management Exam:    Foot Exam (with socks and/or shoes not present):       Sensory-Monofilament:          Left foot: normal          Right foot: normal       Nails:          Left foot: thickened          Right foot: thickened   Impression & Recommendations:  Problem # 1:  CONSTIPATION (ICD-564.00) advised pt to take miralax daily Orders: T-TSH [id number])  Problem # 2:  HYPERTENSION, BENIGN ESSENTIAL (ICD-401.1)  The following medications were removed from the medication list:    Lisinopril 10 Mg Tabs (Lisinopril) .Marland Kitchen... 1 tablet by mouth for blood pressure Her updated medication list for this problem includes:    Benazepril Hcl 10 Mg Tabs (Benazepril hcl) ..... One tablet by mouth daily for blood pressure **pharmacy - d/c lisinopril**  Problem # 3:  DIABETES MELLITUS (ICD-250.00)  The following medications were removed from the medication list:    Lisinopril 10 Mg Tabs (Lisinopril) .Marland Kitchen... 1 tablet by mouth for blood pressure Her updated medication list for this problem includes:    Metformin Hcl 500 Mg Tabs (Metformin hcl) ..... One tablet by mouth two times a day    Glucotrol Xl 5 Mg Xr24h-tab (Glipizide)  ..... One tablet by mouth daily for blood sugar    Bayer Aspirin Ec Low Dose 81 Mg Tbec (Aspirin) .Marland Kitchen... 1 tablet by mouth daily for circulation    Benazepril Hcl 10 Mg Tabs (Benazepril hcl) ..... One tablet by mouth daily for blood pressure **pharmacy - d/c lisinopril**  Orders: Capillary Blood Glucose/CBG [id number])  Problem # 4:  COUGH (ICD-786.2) will change BP meds to see if cough improves  Problem # 5:  UNSPECIFIED BREAST SCREENING (  ICD-V76.10) will schedule mammogram Orders: Mammogram (Screening) (Mammo)  Complete Medication List: 1)  Lac-hydrin 12 % Lotn (Ammonium lactate) .... Apply to affected skin topically two times a day 2)  Glucometer Elite Classic Kit (Blood glucose monitoring suppl) .... Dispense 1 glucometer to check blood sugar twice daily 3)  Metformin Hcl 500 Mg Tabs (Metformin hcl) .... One tablet by mouth two times a day 4)  Glucotrol Xl 5 Mg Xr24h-tab (Glipizide) .... One tablet by mouth daily for blood sugar 5)  Gnp Test Strp (Glucose blood) .... Check blood sugar twice daily 6)  Ibuprofen 600 Mg Tabs (Ibuprofen) .Marland Kitchen.. 1 tablet by mouth two times a day as needed for back pain 7)  Bayer Aspirin Ec Low Dose 81 Mg Tbec (Aspirin) .Marland Kitchen.. 1 tablet by mouth daily for circulation 8)  Lipitor 40 Mg Tabs (Atorvastatin calcium) .Marland Kitchen.. 1 tablet by mouth nightly for cholesterol 9)  Proventil Hfa 108 (90 Base) Mcg/act Aers (Albuterol sulfate) .... Two puffs every 6 hours as needed for shortness of breath 10)  Nexium 40 Mg Cpdr (Esomeprazole magnesium) .... One tablet by mouth daily before breakfast 11)  Benazepril Hcl 10 Mg Tabs (Benazepril hcl) .... One tablet by mouth daily for blood pressure **pharmacy - d/c lisinopril**  Other Orders: T- Hemoglobin A1C [id number]) T-Lipid Profile [id number]) T-CBC w/Diff [id number]) T-Urine Microalbumin w/creat. ratio [id number]) UA Dipstick w/o Micro (manual) [id number])  Diabetes Management Assessment/Plan:      The following lipid  goals have been established for the patient: Total cholesterol goal of 200; LDL cholesterol goal of 100; HDL cholesterol goal of 40; Triglyceride goal of 150.  Her blood pressure goal is < 130/80.    Dyspepsia Assessment/Plan:  Step Therapy: GERD Treatment Protocols:    Step-1: started  Hypertension Assessment/Plan:      The patient's hypertensive risk group is category C: Target organ damage and/or diabetes.  Her calculated 10 year risk of coronary heart disease is 24 %.  Today's blood pressure is 138/83.  Her blood pressure goal is < 130/80.  Patient Instructions: 1)  You will be informed of any abnormal lab results. 2)  Your blood pressure medications have been changed to see if this will help with the cough 3)  Follow up in 3 months - June 2011 for diabetes. Prescriptions: BENAZEPRIL HCL 10 MG TABS (BENAZEPRIL HCL) One tablet by mouth daily for blood pressure **Pharmacy - D/C lisinopril**  #30 x 5   Entered and Authorized by:   Aurora Mask FNP   Signed by:   Aurora Mask FNP on 07/22/2009   Method used:   Faxed to ...       [facility] (retail)       [place], [place]  [address]       Ph: [phone] [place]       Fax: [phone]   RxID:   [id number]   Last LDL:                                                 113 (01/22/2009 10:42:00 PM)        Diabetic Foot Exam  Set Next Diabetic Foot Exam here: 07/25/2010   10-g (5.07) Semmes-Weinstein Monofilament Test Performed by: Shellia Carwin          Right Foot  Left Foot Visual Inspection               Test Control      normal         normal Site 1         normal         normal Site 2         normal         normal Site 3         normal         normal Site 4         normal         normal Site 5         normal         normal Site 6         normal         normal Site 7         normal         normal Site 8         normal         normal Site 9          normal         abnormal Site 10         normal         normal  Impression      normal         normal

## 2010-06-16 NOTE — Assessment & Plan Note (Signed)
Summary: Diabetes/HTN   Vital Signs:  Patient profile:   65 year old female Menstrual status:  postmenopausal Weight:      120.5 pounds BMI:     27.11 Temp:     97.0 degrees F oral Pulse rate:   72 / minute Pulse rhythm:   regular Resp:     16 per minute BP sitting:   110 / 76  (left arm) Cuff size:   regular  Vitals Entered By: Isla Pence (April 26, 2010 9:04 AM)  Nutrition Counseling: Patient's BMI is greater than 25 and therefore counseled on weight management options. CC: follow-up visit DM, Hypertension Management, Lipid Management, Abdominal Pain Is Patient Diabetic? Yes CBG Result 91 CBG Device ID B  Does patient need assistance? Functional Status Self care Ambulation Normal   CC:  follow-up visit DM, Hypertension Management, Lipid Management, and Abdominal Pain.  History of Present Illness:  Pt into the office for f/u on htn and diabetes  Pt lives with son.  He is contemplating her going back to visit [place] for about 6 months and she will leave during February.  Pt presents today with all her medications  No acute problems today. Presents today with her son who is interpreting for her.  Diabetes Management History:      The patient is a 65 years old female who comes in for evaluation of Type 2 Diabetes Mellitus.  She has not been enrolled in the "Diabetic Education Program".  She states understanding of dietary principles and is following her diet appropriately.  Self foot exams are not being performed.  She is checking home blood sugars.  She says that she is not exercising regularly.        Hypoglycemic symptoms are not occurring.  No hyperglycemic symptoms are reported.        No changes have been made to her treatment plan since last visit.    Dyspepsia History:      She has no alarm features of dyspepsia including no history of melena, hematochezia, dysphagia, persistent vomiting, or involuntary weight loss > 5%.  There is a prior history of GERD.   The patient does not have a prior history of documented ulcer disease.  The dominant symptom is not heartburn or acid reflux.  An H-2 blocker medication is not currently being taken.  No previous upper endoscopy has been done.    Hypertension History:      She denies headache, chest pain, and palpitations.  pt is taking benicar as ordered.  Further comments include: no cough present with this medication.        Positive major cardiovascular risk factors include female age 57 years old or older, diabetes, hyperlipidemia, and hypertension.  Negative major cardiovascular risk factors include non-tobacco-user status.        Further assessment for target organ damage reveals no history of ASHD, cardiac end-organ damage (CHF/LVH), stroke/TIA, peripheral vascular disease, renal insufficiency, or hypertensive retinopathy.    Lipid Management History:      Positive NCEP/ATP III risk factors include female age 65 years old or older, diabetes, and hypertension.  Negative NCEP/ATP III risk factors include no history of early menopause without estrogen hormone replacement, non-tobacco-user status, no ASHD (atherosclerotic heart disease), no prior stroke/TIA, no peripheral vascular disease, and no history of aortic aneurysm.        The patient states that she knows about the "Therapeutic Lifestyle Change" diet.  Her compliance with the TLC diet is fair.  The patient  does not know about adjunctive measures for cholesterol lowering.  She expresses no side effects from her lipid-lowering medication.  Comments include: pt is taking lipitor as ordered.  She does not eat a lot of fried foods.  The patient denies any symptoms to suggest myopathy or liver disease.       Current Medications (verified): 1)  Lac-Hydrin 12 % Lotn (Ammonium Lactate) .... Apply To Affected Skin Topically Two Times A Day 2)  Glucometer Elite Classic  Kit (Blood Glucose Monitoring Suppl) .... Dispense 1 Glucometer To Check Blood Sugar Twice  Daily 3)  Metformin Hcl 500 Mg Tabs (Metformin Hcl) .... One Tablet By Mouth Three Times A Day For Diabetes 4)  Glucotrol Xl 5 Mg Xr24h-Tab (Glipizide) .... One Tablet By Mouth Daily For Blood Sugar 5)  Gnp Test  Strp (Glucose Blood) .... Check Blood Sugar Twice Daily 6)  Ibuprofen 600 Mg Tabs (Ibuprofen) .Marland Kitchen.. 1 Tablet By Mouth Two Times A Day As Needed For Back Pain 7)  Bayer Aspirin Ec Low Dose 81 Mg Tbec (Aspirin) .Marland Kitchen.. 1 Tablet By Mouth Daily For Circulation 8)  Lipitor 40 Mg Tabs (Atorvastatin Calcium) .Marland Kitchen.. 1 Tablet By Mouth Nightly For Cholesterol 9)  Proventil Hfa 108 (90 Base) Mcg/act Aers (Albuterol Sulfate) .... Two Puffs Every 6 Hours As Needed For Shortness of Breath 10)  Nexium 40 Mg Cpdr (Esomeprazole Magnesium) .... One Tablet By Mouth Daily Before Breakfast 11)  Benicar 20 Mg Tabs (Olmesartan Medoxomil) .... One Tablet By Mouth Daily For Blood Pressure  Allergies: No Known Drug Allergies  Review of Systems General:  Denies fever. CV:  Denies chest pain or discomfort. Resp:  Denies cough. GI:  Denies abdominal pain, nausea, and vomiting.  Physical Exam  General:  alert.   Head:  normocephalic.   Ears:  ear piercing(s) noted.   Lungs:  normal breath sounds.   Heart:  normal rate and regular rhythm.    Diabetes Management Exam:    Foot Exam (with socks and/or shoes not present):       Sensory-Monofilament:          Left foot: normal          Right foot: normal    Impression & Recommendations:  Problem # 1:  DIABETES MELLITUS (ICD-250.00) will check hgba1c today continue current meds Her updated medication list for this problem includes:    Metformin Hcl 500 Mg Tabs (Metformin hcl) ..... One tablet by mouth three times a day for diabetes    Glucotrol Xl 5 Mg Xr24h-tab (Glipizide) ..... One tablet by mouth daily for blood sugar    Bayer Aspirin Ec Low Dose 81 Mg Tbec (Aspirin) .Marland Kitchen... 1 tablet by mouth daily for circulation    Benicar 20 Mg Tabs (Olmesartan  medoxomil) ..... One tablet by mouth daily for blood pressure  Orders: Capillary Blood Glucose/CBG (82948) T- Hemoglobin A1C [id number]) T-Urine Microalbumin w/creat. ratio [phone]) UA Dipstick w/o Micro (manual) (81002)  Problem # 2:  HYPERTENSION, BENIGN ESSENTIAL (ICD-401.1) BP stable DASH diet reviewed Her updated medication list for this problem includes:    Benicar 20 Mg Tabs (Olmesartan medoxomil) ..... One tablet by mouth daily for blood pressure  Problem # 3:  HYPERCHOLESTEROLEMIA (ICD-272.0) continue current meds Her updated medication list for this problem includes:    Lipitor 40 Mg Tabs (Atorvastatin calcium) .Marland Kitchen... 1 tablet by mouth nightly for cholesterol  Problem # 4:  NEED PROPHYLACTIC VACCINATION&INOCULATION FLU (ICD-V04.81) given today in office  Problem # 5:  DEGENERATIVE DISC DISEASE, LUMBAR SPINE (ICD-722.52) will refill ibuprofen  Complete Medication List: 1)  Lac-hydrin 12 % Lotn (Ammonium lactate) .... Apply to affected skin topically two times a day 2)  Glucometer Elite Classic Kit (Blood glucose monitoring suppl) .... Dispense 1 glucometer to check blood sugar twice daily 3)  Metformin Hcl 500 Mg Tabs (Metformin hcl) .... One tablet by mouth three times a day for diabetes 4)  Glucotrol Xl 5 Mg Xr24h-tab (Glipizide) .... One tablet by mouth daily for blood sugar 5)  Gnp Test Strp (Glucose blood) .... Check blood sugar twice daily 6)  Ibuprofen 600 Mg Tabs (Ibuprofen) .Marland Kitchen.. 1 tablet by mouth two times a day as needed for back pain 7)  Bayer Aspirin Ec Low Dose 81 Mg Tbec (Aspirin) .Marland Kitchen.. 1 tablet by mouth daily for circulation 8)  Lipitor 40 Mg Tabs (Atorvastatin calcium) .Marland Kitchen.. 1 tablet by mouth nightly for cholesterol 9)  Proventil Hfa 108 (90 Base) Mcg/act Aers (Albuterol sulfate) .... Two puffs every 6 hours as needed for shortness of breath 10)  Nexium 40 Mg Cpdr (Esomeprazole magnesium) .... One tablet by mouth daily before breakfast 11)   Benicar 20 Mg Tabs (Olmesartan medoxomil) .... One tablet by mouth daily for blood pressure  Other Orders: Flu Vaccine 4yrs + [id number]) Admin 1st Vaccine [id number])  Diabetes Management Assessment/Plan:      The following lipid goals have been established for the patient: Total cholesterol goal of 200; LDL cholesterol goal of 100; HDL cholesterol goal of 40; Triglyceride goal of 150.  Her blood pressure goal is < 130/80.    Dyspepsia Assessment/Plan:  Step Therapy: GERD Treatment Protocols:    Step-1: started  Hypertension Assessment/Plan:      The patient's hypertensive risk group is category C: Target organ damage and/or diabetes.  Her calculated 10 year risk of coronary heart disease is 15 %.  Today's blood pressure is 110/76.  Her blood pressure goal is < 130/80.  Lipid Assessment/Plan:      Based on NCEP/ATP III, the patient's risk factor category is "history of diabetes".  The patient's lipid goals are as follows: Total cholesterol goal is 200; LDL cholesterol goal is 100; HDL cholesterol goal is 40; Triglyceride goal is 150.  Her LDL cholesterol goal has not been met.  Secondary causes for hyperlipidemia have been ruled out.  She has been counseled on adjunctive measures for lowering her cholesterol.    Patient Instructions: 1)  You have been given the flu vaccine today. 2)  Blood pressure - doing much better.  Keep taking benicar 40mg  by mouth daily 3)  Diabetes - your hgba1c will be checked today to make sure that you still have good control. You wil be notified of the results. 4)  Follow up mid-February for diabetes and high blood pressure before you leave for the trip. we can discuss medication options for travel at that time Prescriptions: IBUPROFEN 600 MG TABS (IBUPROFEN) 1 tablet by mouth two times a day as needed for back pain  #50 x 1   Entered and Authorized by:   Aurora Mask FNP   Signed by:   Aurora Mask FNP on 04/26/2010   Method used:   Faxed to ...        [facility] (retail)       [place], [place]  [address]       Ph: [phone]  Fax: [phone]   RxIDQH:[id number]   Diabetic Foot Exam Foot Inspection Is there a history of a foot ulcer?              Yes Is there a foot ulcer now?              No Can the patient see the bottom of their feet?          Yes Are the shoes appropriate in style and fit?          Yes Is there swelling or an abnormal foot shape?          No Are the toenails long?                No Are the toenails thick?                Yes Are the toenails ingrown?              No Is there heavy callous build-up?              No Is there pain in the calf muscle (Intermittent claudication) when walking?    NoIs there a claw toe deformity?              No Is there elevated skin temperature?            No Is there limited ankle dorsiflexion?            No Is there foot or ankle muscle weakness?            No  Diabetic Foot Care Education Patient educated on appropriate care of diabetic feet.     10-g (5.07) Semmes-Weinstein Monofilament Test Performed by: Isla Pence          Right Foot          Left Foot Visual Inspection                 Orders Added: 1)  Capillary Blood Glucose/CBG [82948] 2)  Flu Vaccine 25yrs + [id number] 3)  Admin 1st Vaccine [90471] 4)  Est. Patient Level IV [id number] 5)  T- Hemoglobin A1C [83036-23375] 6)  T-Urine Microalbumin w/creat. ratio [[id number]] 7)  UA Dipstick w/o Micro (manual) [81002]   Immunizations Administered:  Influenza Vaccine # 1:    Vaccine Type: Fluvax 3+    Site: left deltoid    Mfr: GlaxoSmithKline    Dose: 0.5 ml    Route: IM    Given by: Isla Pence    Exp. Date: 11/12/2010    Lot #: [id number]    VIS given: 12/07/09 version given April 26, 2010.  Flu Vaccine Consent Questions:    Do you have a history of severe allergic reactions to this vaccine? no    Any prior history  of allergic reactions to egg and/or gelatin? no    Do you have a sensitivity to the preservative Thimersol? no    Do you have a past history of Guillan-Barre Syndrome? no    Do you currently have an acute febrile illness? no    Have you ever had a severe reaction to latex? no    Vaccine information given and explained to patient? yes    Are you currently pregnant? no    ndc  [id number]  Immunizations Administered:  Influenza Vaccine # 1:    Vaccine Type: Fluvax 3+    Site: left deltoid    Mfr: GlaxoSmithKline  Dose: 0.5 ml    Route: IM    Given by: Isla Pence    Exp. Date: 11/12/2010    Lot #: [id number]    VIS given: 12/07/09 version given April 26, 2010.    Last LDL:                                                 126 (01/24/2010 11:16:00 PM)          Diabetic Foot Exam Diabetic Foot Care Education :Patient educated on appropriate care of diabetic feet.     10-g (5.07) Semmes-Weinstein Monofilament Test Performed by: Isla Pence          Right Foot          Left Foot Visual Inspection               Test Control      normal         normal Site 1         normal         normal Site 2         normal         normal Site 3         normal         normal Site 4         normal         normal Site 5         normal         normal Site 6         normal         normal Site 7         normal         normal Site 8         normal         normal Site 9         normal         normal Site 10         normal         normal  Impression      normal         normal   Laboratory Results   Urine Tests  Date/Time Received: April 26, 2010 10:17 AM   Routine Urinalysis   Color: lt. yellow Glucose: negative   (Normal Range: Negative) Bilirubin: negative   (Normal Range: Negative) Ketone: negative   (Normal Range: Negative) Spec. Gravity: <1.005   (Normal Range: 1.003-1.035) Blood: negative   (Normal Range: Negative) pH: 6.0   (Normal Range: 5.0-8.0) Protein:  negative   (Normal Range: Negative) Urobilinogen: 0.2   (Normal Range: 0-1) Nitrite: negative   (Normal Range: Negative) Leukocyte Esterace: negative   (Normal Range: Negative)     Blood Tests     CBG Random:: 91mg /dL

## 2011-09-25 ENCOUNTER — Encounter: Payer: Self-pay | Admitting: Cardiovascular Disease

## 2011-09-25 ENCOUNTER — Ambulatory Visit (INDEPENDENT_AMBULATORY_CARE_PROVIDER_SITE_OTHER): Payer: Self-pay | Admitting: Cardiovascular Disease

## 2011-09-25 DIAGNOSIS — R079 Chest pain, unspecified: Secondary | ICD-10-CM | POA: Insufficient documentation

## 2011-09-25 DIAGNOSIS — R011 Cardiac murmur, unspecified: Secondary | ICD-10-CM

## 2011-09-25 NOTE — Patient Instructions (Signed)
Your physician has requested that you have a lexiscan myoview.  Please follow instruction sheet, as given.  Your physician has requested that you have an echocardiogram. Echocardiography is a painless test that uses sound waves to create images of your heart. It provides your doctor with information about the size and shape of your heart and how well your heart's chambers and valves are working. This procedure takes approximately one hour. There are no restrictions for this procedure.  Your physician recommends that you schedule a follow-up appointment in: 3 MONTHS

## 2011-09-25 NOTE — Assessment & Plan Note (Signed)
The patient presents with episodes of chest pain. She seems to have 2 different kinds of chest pain. She clearly has symptoms with exertion. These are described as a chest pressure. This pressure radiates through to her back. It is occurs when she is walking or climbing up steps. He gets better when she rests.  She is also having significant pain related she. These sound more like gastroesophageal reflux. She also belches frequently and has a sour taste in her mouth.  She's been on Nexium for quite some time and also occasionally takes Mylanta. These episodes of reflux-like pain do not seem to be improved with Nexium and Mylanta.  She's had an EKG which revealed nonspecific ST and T wave changes.  They will be important for [place] to rule out coronary artery disease.  I would like to get a [employer] study for further evaluation.

## 2011-09-25 NOTE — Assessment & Plan Note (Addendum)
She has a systolic heart murmur. There is also question of a diastolic heart murmur. We will get an echocardiogram.  I'll see her back in the office in 3 months.

## 2011-09-25 NOTE — Progress Notes (Signed)
    Amanda [name] Date of Birth  02/21/1946       [facility] Office [address], [address], [place], [place]  [address]   [place], [place]  [address] [phone]     [phone]   Fax  [phone]    Fax [phone]  Problem List: 1. Chest pain- mildly abnormal EKG 2. Diabetes mellitus type 2 3. Hypertension 4. Hyperlipidemia  History of Present Illness:  Amanda [name] is a 8 she will female from [place]. She was seen with her husband today. He acted as Astronomer.  Pt with hx of of chest pain - typically with exercise - walking or climbing a ladder.  The episodes are associated with dyspnea.  She also had a "burning sensation" during these episodes - not just chest burning but total body burning.  There is radiation of the chest pain to her mid scapular region.  She complains of belching and burping up sour tasting acid.  This bitter taste has not improved even after taking the acid blocking medications.   Current Outpatient Prescriptions on File Prior to Visit  Medication Sig Dispense Refill  . esomeprazole (NEXIUM) 40 MG capsule Take 40 mg by mouth daily before breakfast.      . glipiZIDE (GLUCOTROL) 5 MG tablet Take 5 mg by mouth daily.      . metFORMIN (GLUCOPHAGE) 500 MG tablet Take 500 mg by mouth 2 (two) times daily with a meal.      . olmesartan (BENICAR) 40 MG tablet Take 40 mg by mouth daily.      . pravastatin (PRAVACHOL) 80 MG tablet Take 80 mg by mouth daily.        No Known Allergies  Past Medical History  Diagnosis Date  . Hypertension   . Hyperlipidemia   . Diabetes mellitus   . Abdominal pain   . Chest pain     Atypical    History reviewed. No pertinent past surgical history.  History  Smoking status  . Never Smoker   Smokeless tobacco  . Not on file    History  Alcohol Use     History reviewed. No pertinent family history.  Reviw of Systems:  Reviewed in the HPI.  All other  systems are negative.  Physical Exam: Blood pressure 110/60, pulse 70, height 4\' 8"  (1.422 m), weight 110 lb 6.4 oz (50.077 kg). General: Well developed, well nourished, in no acute distress.  Head: Normocephalic, atraumatic, sclera non-icteric, mucus membranes are moist,   Neck: Supple. Carotids are 2 + without bruits. No JVD  Lungs: Clear bilaterally to auscultation.  Heart: regular rate.  normal  S1 S2. No murmurs, gallops or rubs.  Abdomen: Soft, non-tender, non-distended with normal bowel sounds. No hepatomegaly. No rebound/guarding. No masses.  Msk:  Strength and tone are normal  Extremities: No clubbing or cyanosis. No edema.  Distal pedal pulses are 2+ and equal bilaterally.  Neuro: Alert and oriented X 3. Moves all extremities spontaneously.  Psych:  Responds to questions appropriately with a normal affect.  ECG: Her EKG from several weeks ago reveals normal sinus rhythm at 71 beats a minute. She has nonspecific ST and T wave abnormalities.  Assessment / Plan:

## 2011-09-26 ENCOUNTER — Institutional Professional Consult (permissible substitution): Payer: Self-pay | Admitting: Cardiovascular Disease

## 2011-09-29 ENCOUNTER — Institutional Professional Consult (permissible substitution): Payer: Self-pay | Admitting: Cardiovascular Disease

## 2011-10-04 ENCOUNTER — Ambulatory Visit (HOSPITAL_BASED_OUTPATIENT_CLINIC_OR_DEPARTMENT_OTHER): Payer: Medicaid Other | Admitting: Radiology

## 2011-10-04 ENCOUNTER — Inpatient Hospital Stay (HOSPITAL_COMMUNITY): Payer: Medicaid Other

## 2011-10-04 ENCOUNTER — Encounter (HOSPITAL_COMMUNITY)
Admission: EM | Disposition: A | Discharge status: Home / Self Care | Payer: Self-pay | Source: Home / Self Care | Attending: Cardiothoracic Surgery

## 2011-10-04 ENCOUNTER — Ambulatory Visit (HOSPITAL_BASED_OUTPATIENT_CLINIC_OR_DEPARTMENT_OTHER): Payer: Medicaid Other

## 2011-10-04 ENCOUNTER — Inpatient Hospital Stay (HOSPITAL_COMMUNITY)
Admission: EM | Admit: 2011-10-04 | Discharge: 2011-10-14 | DRG: 234 | Disposition: A | Discharge status: Home / Self Care | Payer: Medicaid Other | Attending: Cardiothoracic Surgery | Admitting: Cardiothoracic Surgery

## 2011-10-04 ENCOUNTER — Other Ambulatory Visit: Payer: Self-pay

## 2011-10-04 ENCOUNTER — Ambulatory Visit (HOSPITAL_COMMUNITY): Admit: 2011-10-04 | Payer: Self-pay | Admitting: Cardiovascular Disease

## 2011-10-04 ENCOUNTER — Encounter (HOSPITAL_COMMUNITY): Payer: Self-pay | Admitting: Emergency Medicine

## 2011-10-04 DIAGNOSIS — R9431 Abnormal electrocardiogram [ECG] [EKG]: Secondary | ICD-10-CM

## 2011-10-04 DIAGNOSIS — E7800 Pure hypercholesterolemia, unspecified: Secondary | ICD-10-CM | POA: Diagnosis present

## 2011-10-04 DIAGNOSIS — E8779 Other fluid overload: Secondary | ICD-10-CM | POA: Diagnosis not present

## 2011-10-04 DIAGNOSIS — L299 Pruritus, unspecified: Secondary | ICD-10-CM | POA: Diagnosis not present

## 2011-10-04 DIAGNOSIS — R011 Cardiac murmur, unspecified: Secondary | ICD-10-CM

## 2011-10-04 DIAGNOSIS — I2510 Atherosclerotic heart disease of native coronary artery without angina pectoris: Secondary | ICD-10-CM

## 2011-10-04 DIAGNOSIS — R079 Chest pain, unspecified: Secondary | ICD-10-CM | POA: Insufficient documentation

## 2011-10-04 DIAGNOSIS — Z7982 Long term (current) use of aspirin: Secondary | ICD-10-CM

## 2011-10-04 DIAGNOSIS — E1169 Type 2 diabetes mellitus with other specified complication: Secondary | ICD-10-CM | POA: Diagnosis present

## 2011-10-04 DIAGNOSIS — I10 Essential (primary) hypertension: Secondary | ICD-10-CM | POA: Diagnosis present

## 2011-10-04 DIAGNOSIS — L97509 Non-pressure chronic ulcer of other part of unspecified foot with unspecified severity: Secondary | ICD-10-CM | POA: Diagnosis present

## 2011-10-04 DIAGNOSIS — R3911 Hesitancy of micturition: Secondary | ICD-10-CM | POA: Diagnosis not present

## 2011-10-04 DIAGNOSIS — I739 Peripheral vascular disease, unspecified: Secondary | ICD-10-CM

## 2011-10-04 DIAGNOSIS — R072 Precordial pain: Secondary | ICD-10-CM

## 2011-10-04 DIAGNOSIS — K219 Gastro-esophageal reflux disease without esophagitis: Secondary | ICD-10-CM | POA: Diagnosis present

## 2011-10-04 DIAGNOSIS — Z951 Presence of aortocoronary bypass graft: Secondary | ICD-10-CM

## 2011-10-04 DIAGNOSIS — E119 Type 2 diabetes mellitus without complications: Secondary | ICD-10-CM

## 2011-10-04 DIAGNOSIS — Z01812 Encounter for preprocedural laboratory examination: Secondary | ICD-10-CM

## 2011-10-04 DIAGNOSIS — IMO0002 Reserved for concepts with insufficient information to code with codable children: Secondary | ICD-10-CM | POA: Diagnosis not present

## 2011-10-04 DIAGNOSIS — I200 Unstable angina: Secondary | ICD-10-CM | POA: Diagnosis present

## 2011-10-04 DIAGNOSIS — D62 Acute posthemorrhagic anemia: Secondary | ICD-10-CM | POA: Diagnosis not present

## 2011-10-04 DIAGNOSIS — M47817 Spondylosis without myelopathy or radiculopathy, lumbosacral region: Secondary | ICD-10-CM | POA: Diagnosis present

## 2011-10-04 DIAGNOSIS — Y9210 Unspecified residential institution as the place of occurrence of the external cause: Secondary | ICD-10-CM | POA: Diagnosis present

## 2011-10-04 DIAGNOSIS — A048 Other specified bacterial intestinal infections: Secondary | ICD-10-CM | POA: Diagnosis present

## 2011-10-04 DIAGNOSIS — K5900 Constipation, unspecified: Secondary | ICD-10-CM | POA: Diagnosis not present

## 2011-10-04 DIAGNOSIS — Y832 Surgical operation with anastomosis, bypass or graft as the cause of abnormal reaction of the patient, or of later complication, without mention of misadventure at the time of the procedure: Secondary | ICD-10-CM | POA: Diagnosis not present

## 2011-10-04 HISTORY — PX: LEFT HEART CATHETERIZATION WITH CORONARY ANGIOGRAM: SHX5451

## 2011-10-04 HISTORY — DX: Gastro-esophageal reflux disease without esophagitis: K21.9

## 2011-10-04 HISTORY — PX: CORONARY ARTERY BYPASS GRAFT: SHX141

## 2011-10-04 LAB — POCT I-STAT TROPONIN I: Troponin i, poc: 0 ng/mL (ref 0.00–0.08)

## 2011-10-04 LAB — BASIC METABOLIC PANEL WITH GFR
BUN: 32 mg/dL — ABNORMAL HIGH (ref 6–23)
CO2: 24 meq/L (ref 19–32)
Calcium: 10.2 mg/dL (ref 8.4–10.5)
Chloride: 100 meq/L (ref 96–112)
Creatinine, Ser: 1.16 mg/dL — ABNORMAL HIGH (ref 0.50–1.10)
GFR calc Af Amer: 56 mL/min — ABNORMAL LOW (ref >90)
GFR calc non Af Amer: 48 mL/min — ABNORMAL LOW (ref >90)
Glucose, Bld: 88 mg/dL (ref 70–99)
Potassium: 3.9 meq/L (ref 3.5–5.1)
Sodium: 139 meq/L (ref 135–145)

## 2011-10-04 LAB — CBC
HCT: 33 % — ABNORMAL LOW (ref 36.0–46.0)
Hemoglobin: 11 g/dL — ABNORMAL LOW (ref 12.0–15.0)
RBC: 4.24 MIL/uL (ref 3.87–5.11)
WBC: 5.8 10*3/uL (ref 4.0–10.5)

## 2011-10-04 LAB — CARDIAC PANEL(CRET KIN+CKTOT+MB+TROPI)
CK, MB: 2.1 ng/mL (ref 0.3–4.0)
Total CK: 90 U/L (ref 7–177)

## 2011-10-04 LAB — PREPARE RBC (CROSSMATCH)

## 2011-10-04 LAB — POCT ACTIVATED CLOTTING TIME: Activated Clotting Time: 133 seconds

## 2011-10-04 LAB — GLUCOSE, CAPILLARY: Glucose-Capillary: 143 mg/dL — ABNORMAL HIGH (ref 70–99)

## 2011-10-04 SURGERY — LEFT HEART CATHETERIZATION WITH CORONARY ANGIOGRAM
Anesthesia: LOCAL

## 2011-10-04 MED ORDER — HEPARIN (PORCINE) IN NACL 2-0.9 UNIT/ML-% IJ SOLN
INTRAMUSCULAR | Status: AC
  Filled 2011-10-04: qty 2000

## 2011-10-04 MED ORDER — AMINOPHYLLINE 25 MG/ML IV SOLN
75.0000 mg | Freq: Once | INTRAVENOUS | Status: AC
  Administered 2011-10-04: 75 mg via INTRAVENOUS

## 2011-10-04 MED ORDER — NITROGLYCERIN 0.2 MG/ML ON CALL CATH LAB
INTRAVENOUS | Status: AC
  Filled 2011-10-04: qty 1

## 2011-10-04 MED ORDER — ENOXAPARIN SODIUM 40 MG/0.4ML ~~LOC~~ SOLN
40.0000 mg | Freq: Every day | SUBCUTANEOUS | Status: DC
  Filled 2011-10-04: qty 0.4

## 2011-10-04 MED ORDER — NITROGLYCERIN IN D5W 200-5 MCG/ML-% IV SOLN
2.0000 ug/min | INTRAVENOUS | Status: AC
  Administered 2011-10-05: 5 ug/kg/min via INTRAVENOUS
  Filled 2011-10-04: qty 250

## 2011-10-04 MED ORDER — PANTOPRAZOLE SODIUM 40 MG PO TBEC
80.0000 mg | DELAYED_RELEASE_TABLET | Freq: Every day | ORAL | Status: DC
  Administered 2011-10-04 – 2011-10-05 (×2): 80 mg via ORAL
  Filled 2011-10-04 (×2): qty 2

## 2011-10-04 MED ORDER — DEXTROSE 5 % IV SOLN
30.0000 ug/min | INTRAVENOUS | Status: AC
  Administered 2011-10-05: 20 ug/min via INTRAVENOUS
  Filled 2011-10-04: qty 2

## 2011-10-04 MED ORDER — POTASSIUM CHLORIDE 2 MEQ/ML IV SOLN
80.0000 meq | INTRAVENOUS | Status: DC
  Filled 2011-10-04: qty 40

## 2011-10-04 MED ORDER — VANCOMYCIN HCL 1000 MG IV SOLR
1000.0000 mg | INTRAVENOUS | Status: AC
  Administered 2011-10-05: 1000 mg via INTRAVENOUS
  Filled 2011-10-04: qty 1000

## 2011-10-04 MED ORDER — INSULIN ASPART 100 UNIT/ML ~~LOC~~ SOLN: 0.0000 [IU] | Freq: Three times a day (TID) | SUBCUTANEOUS | Status: DC

## 2011-10-04 MED ORDER — SIMVASTATIN 40 MG PO TABS
40.0000 mg | ORAL_TABLET | Freq: Every day | ORAL | Status: DC
  Administered 2011-10-06 – 2011-10-09 (×4): 40 mg via ORAL
  Filled 2011-10-04 (×6): qty 1

## 2011-10-04 MED ORDER — MIDAZOLAM HCL 2 MG/2ML IJ SOLN
INTRAMUSCULAR | Status: AC
  Filled 2011-10-04: qty 2

## 2011-10-04 MED ORDER — MAGNESIUM SULFATE 50 % IJ SOLN
40.0000 meq | INTRAMUSCULAR | Status: DC
  Filled 2011-10-04: qty 10

## 2011-10-04 MED ORDER — DEXTROSE 5 % IV SOLN
750.0000 mg | INTRAVENOUS | Status: DC
  Filled 2011-10-04: qty 750

## 2011-10-04 MED ORDER — EPINEPHRINE HCL 1 MG/ML IJ SOLN
0.5000 ug/min | INTRAVENOUS | Status: DC
  Filled 2011-10-04: qty 4

## 2011-10-04 MED ORDER — SODIUM CHLORIDE 0.9 % IJ SOLN
3.0000 mL | Freq: Two times a day (BID) | INTRAMUSCULAR | Status: DC
  Administered 2011-10-04 – 2011-10-05 (×2): 3 mL via INTRAVENOUS

## 2011-10-04 MED ORDER — SODIUM CHLORIDE 0.9 % IV SOLN
0.1000 ug/kg/h | INTRAVENOUS | Status: AC
  Administered 2011-10-05: .3 ug/kg/h via INTRAVENOUS
  Filled 2011-10-04: qty 4

## 2011-10-04 MED ORDER — LABETALOL HCL 5 MG/ML IV SOLN
INTRAVENOUS | Status: AC
  Filled 2011-10-04: qty 4

## 2011-10-04 MED ORDER — LABETALOL HCL 5 MG/ML IV SOLN
20.0000 mg | Freq: Once | INTRAVENOUS | Status: AC
  Administered 2011-10-04: 20 mg via INTRAVENOUS

## 2011-10-04 MED ORDER — LIDOCAINE HCL (PF) 1 % IJ SOLN
INTRAMUSCULAR | Status: AC
  Filled 2011-10-04: qty 30

## 2011-10-04 MED ORDER — SODIUM BICARBONATE 8.4 % IV SOLN
INTRAVENOUS | Status: AC
  Administered 2011-10-05: 18:00:00
  Filled 2011-10-04: qty 2.5

## 2011-10-04 MED ORDER — ACETAMINOPHEN 325 MG PO TABS: 650.0000 mg | ORAL_TABLET | ORAL | Status: DC | PRN

## 2011-10-04 MED ORDER — DOPAMINE-DEXTROSE 3.2-5 MG/ML-% IV SOLN
2.0000 ug/kg/min | INTRAVENOUS | Status: DC
  Filled 2011-10-04: qty 250

## 2011-10-04 MED ORDER — GLIPIZIDE 5 MG PO TABS
5.0000 mg | ORAL_TABLET | Freq: Every day | ORAL | Status: DC
  Administered 2011-10-04: 5 mg via ORAL
  Filled 2011-10-04 (×2): qty 1

## 2011-10-04 MED ORDER — TEMAZEPAM 15 MG PO CAPS: 15.0000 mg | ORAL_CAPSULE | Freq: Once | ORAL | Status: AC | PRN

## 2011-10-04 MED ORDER — NITROGLYCERIN 0.4 MG SL SUBL: 0.4000 mg | SUBLINGUAL_TABLET | SUBLINGUAL | Status: DC | PRN

## 2011-10-04 MED ORDER — HEPARIN (PORCINE) IN NACL 100-0.45 UNIT/ML-% IJ SOLN
700.0000 [IU]/h | INTRAMUSCULAR | Status: DC
  Administered 2011-10-05: 700 [IU]/h via INTRAVENOUS
  Filled 2011-10-04: qty 250

## 2011-10-04 MED ORDER — CHLORHEXIDINE GLUCONATE 4 % EX LIQD
60.0000 mL | Freq: Once | CUTANEOUS | Status: AC
  Administered 2011-10-05: 4 via TOPICAL
  Filled 2011-10-04: qty 60

## 2011-10-04 MED ORDER — ONDANSETRON HCL 4 MG/2ML IJ SOLN: 4.0000 mg | Freq: Four times a day (QID) | INTRAMUSCULAR | Status: DC | PRN

## 2011-10-04 MED ORDER — REGADENOSON 0.4 MG/5ML IV SOLN
0.4000 mg | Freq: Once | INTRAVENOUS | Status: AC
  Administered 2011-10-04: 0.4 mg via INTRAVENOUS

## 2011-10-04 MED ORDER — ZOLPIDEM TARTRATE 5 MG PO TABS: 5.0000 mg | ORAL_TABLET | Freq: Every evening | ORAL | Status: DC | PRN

## 2011-10-04 MED ORDER — SODIUM CHLORIDE 0.9 % IV SOLN: 250.0000 mL | INTRAVENOUS | Status: DC | PRN

## 2011-10-04 MED ORDER — BISACODYL 5 MG PO TBEC: 5.0000 mg | DELAYED_RELEASE_TABLET | Freq: Once | ORAL | Status: DC

## 2011-10-04 MED ORDER — DEXTROSE 5 % IV SOLN
1.5000 g | INTRAVENOUS | Status: AC
  Administered 2011-10-05: .75 g via INTRAVENOUS
  Administered 2011-10-05: 1.5 g via INTRAVENOUS
  Filled 2011-10-04: qty 1.5

## 2011-10-04 MED ORDER — TRANEXAMIC ACID 100 MG/ML IV SOLN
1.5000 mg/kg/h | INTRAVENOUS | Status: AC
  Administered 2011-10-05: 1.5 mg/kg/h via INTRAVENOUS
  Filled 2011-10-04: qty 25

## 2011-10-04 MED ORDER — TRANEXAMIC ACID (OHS) BOLUS VIA INFUSION
15.0000 mg/kg | INTRAVENOUS | Status: AC
  Administered 2011-10-05: 739.5 mg via INTRAVENOUS
  Filled 2011-10-04: qty 740

## 2011-10-04 MED ORDER — ASPIRIN EC 325 MG PO TBEC
325.0000 mg | DELAYED_RELEASE_TABLET | Freq: Every day | ORAL | Status: DC
  Administered 2011-10-04 – 2011-10-05 (×2): 325 mg via ORAL
  Filled 2011-10-04 (×2): qty 1

## 2011-10-04 MED ORDER — SODIUM CHLORIDE 0.9 % IJ SOLN: 3.0000 mL | INTRAMUSCULAR | Status: DC | PRN

## 2011-10-04 MED ORDER — IRBESARTAN 300 MG PO TABS
300.0000 mg | ORAL_TABLET | Freq: Every day | ORAL | Status: DC
  Administered 2011-10-04 – 2011-10-05 (×2): 300 mg via ORAL
  Filled 2011-10-04 (×2): qty 1

## 2011-10-04 MED ORDER — SODIUM CHLORIDE 0.9 % IV SOLN
INTRAVENOUS | Status: AC
  Administered 2011-10-05: 2.8 [IU]/h via INTRAVENOUS
  Filled 2011-10-04: qty 1

## 2011-10-04 MED ORDER — TECHNETIUM TC 99M TETROFOSMIN IV KIT
33.0000 | PACK | Freq: Once | INTRAVENOUS | Status: AC | PRN
  Administered 2011-10-04: 33 via INTRAVENOUS

## 2011-10-04 MED ORDER — SODIUM CHLORIDE 0.9 % IV SOLN: INTRAVENOUS | Status: DC

## 2011-10-04 MED ORDER — METOPROLOL TARTRATE 12.5 MG HALF TABLET
12.5000 mg | ORAL_TABLET | Freq: Two times a day (BID) | ORAL | Status: DC
  Administered 2011-10-04 – 2011-10-05 (×2): 12.5 mg via ORAL
  Filled 2011-10-04 (×3): qty 1

## 2011-10-04 MED ORDER — TECHNETIUM TC 99M TETROFOSMIN IV KIT
11.0000 | PACK | Freq: Once | INTRAVENOUS | Status: AC | PRN
  Administered 2011-10-04: 11 via INTRAVENOUS

## 2011-10-04 MED ORDER — ALPRAZOLAM 0.25 MG PO TABS: 0.2500 mg | ORAL_TABLET | Freq: Two times a day (BID) | ORAL | Status: DC | PRN

## 2011-10-04 MED ORDER — ASPIRIN 300 MG RE SUPP
300.0000 mg | RECTAL | Status: DC
  Filled 2011-10-04: qty 1

## 2011-10-04 MED ORDER — ASPIRIN 81 MG PO CHEW: 324.0000 mg | CHEWABLE_TABLET | ORAL | Status: DC

## 2011-10-04 MED ORDER — ASPIRIN 81 MG PO TABS: 81.0000 mg | ORAL_TABLET | Freq: Every day | ORAL | Status: DC

## 2011-10-04 MED ORDER — ASPIRIN 81 MG PO CHEW: 81.0000 mg | CHEWABLE_TABLET | Freq: Every day | ORAL | Status: DC

## 2011-10-04 MED ORDER — TRANEXAMIC ACID (OHS) PUMP PRIME SOLUTION
2.0000 mg/kg | INTRAVENOUS | Status: DC
  Filled 2011-10-04: qty 0.99

## 2011-10-04 MED ORDER — FENTANYL CITRATE 0.05 MG/ML IJ SOLN
INTRAMUSCULAR | Status: AC
  Filled 2011-10-04: qty 2

## 2011-10-04 NOTE — ED Notes (Signed)
Patient resting quietly, calm with unlabored respirations.  

## 2011-10-04 NOTE — ED Notes (Signed)
Patient resting quietly, calm with unlabored respirations.   Auguello, PA-C from [facility] is at bedside with patient.

## 2011-10-04 NOTE — ED Notes (Signed)
Per charge nurse, cath lab now ready for pt - pt to be transported to cath lab upon arrival from xray

## 2011-10-04 NOTE — H&P (Signed)
History and Physical   Patient ID: Amanda [name] MRN: [id number], DOB/AGE: 1946/05/14   Admit date: 10/04/2011 Date of Consult: 10/04/2011   Primary Physician: Aurora Mask, NP, NP Primary Cardiologist: Mertie Moores, MD  Pt. Profile: Amanda [name] is 66yo from [place], [place] who knows no English with no prior cardiac history and PMHx significant for type 2 DM, HTN and HL who presents to [facility] ED from the office after an abnormal Lexiscan Myoview.   Problem List: Past Medical History  Diagnosis Date  . Hypertension   . Hyperlipidemia   . Diabetes mellitus   . Abdominal pain   . Chest pain     Atypical    No past surgical history on file.   Allergies: No Known Allergies  HPI:   Her chest pain has been ongoing for the past year. This usually occurs when she walks (< 1 mile). It has progressively worsened, and occasionally happens at night at rest. It is described has substernal with radiation to her back with occasional radiation to her arm. She reports associated shortness of breath and hot flushing. She does endorse some associated palpitations and diaphoresis. No orthopnea, PND, LE edema. No fevers, n/v/d, cough or chills. No active bleeding. No issues with her kidneys. She takes her medications as prescribed. She does complain of belching and water brash after eating. She endorses occasional chest "burning." This is different from the chest pain described above.   She was scheduled for a [employer] today. She experienced chest pain and hypotension with this. Upon reviewing the Myoview images, an inferolateral scar with some peri-infarct ischemia was visualized. Given her worsening symptoms concerning for unstable angina and evidence of ischemia, she was emergently transferred to [facility] ED for cardiac catheterization.   The patient arrived in the ED. EKG reveals no evidence of ischemia. VSS. Labs were ordered and are pending.   Home Medications: Prior to Admission  medications   Medication Sig Start Date End Date Taking? Authorizing Provider  aspirin 81 MG tablet Take 81 mg by mouth daily.   Yes Historical Provider, MD  esomeprazole (NEXIUM) 40 MG capsule Take 40 mg by mouth daily before breakfast.   Yes Historical Provider, MD  glipiZIDE (GLUCOTROL) 5 MG tablet Take 5 mg by mouth daily.   Yes Historical Provider, MD  ibuprofen (ADVIL,MOTRIN) 600 MG tablet Take 600 mg by mouth every 6 (six) hours as needed. For pain   Yes Historical Provider, MD  metFORMIN (GLUCOPHAGE) 500 MG tablet Take 500 mg by mouth 2 (two) times daily with a meal.   Yes Historical Provider, MD  olmesartan (BENICAR) 40 MG tablet Take 40 mg by mouth daily.   Yes Historical Provider, MD  pravastatin (PRAVACHOL) 80 MG tablet Take 80 mg by mouth daily.   Yes Historical Provider, MD    Inpatient Medications:      (Not in a hospital admission)  No family history on file.   History   Social History  . Marital Status: Widowed    Spouse Name: N/A    Number of Children: 6  . Years of Education: N/A   Occupational History  . Not on file.   Social History Main Topics  . Smoking status: Never Smoker   . Smokeless tobacco: Not on file  . Alcohol Use: No  . Drug Use: No  . Sexually Active: Not Currently   Other Topics Concern  . Not on file   Social History Narrative   Lives in [place], [place] with  son.      Review of Systems: General: negative for chills, fever, night sweats or weight changes.  Cardiovascular: positive for for chest pain, dyspnea on exertion, palpitations, shortness of breath, negative for edema, orthopnea paroxysmal nocturnal dyspnea Dermatological: negative for rash Respiratory: negative for cough or wheezing Urologic: negative for hematuria Abdominal: positive for belching and burning chest discomfort after eating, negative for nausea, vomiting, diarrhea, bright red blood per rectum, melena, or hematemesis Neurologic:  negative for visual changes,  syncope, or dizziness All other systems reviewed and are otherwise negative except as noted above.  Physical Exam: Blood pressure 130/75, pulse 64, temperature 98 F (36.7 C), temperature source Oral, resp. rate 18, SpO2 96.00%.    General: Elderly, well developed, well nourished, in no acute distress. Head: Normocephalic, atraumatic, sclera non-icteric, no xanthomas, nares are without discharge.  Neck: Negative for carotid bruits. JVD not elevated. Lungs: Clear bilaterally to auscultation without wheezes, rales, or rhonchi. Breathing is unlabored. Heart: RRR with S1 S2. III/VI systolic murmur at apex radiating to axilla. No rubs, or gallops appreciated. Abdomen: Soft, non-tender, non-distended with normoactive bowel sounds. No hepatomegaly. No rebound/guarding. No obvious abdominal masses. Msk:  Strength and tone appears normal for age. Extremities: No clubbing, cyanosis or edema.  Distal pedal pulses are 2+ and equal bilaterally. Neuro: Alert and oriented X 3. Moves all extremities spontaneously. Psych:  Responds to questions appropriately with a normal affect.  Labs: Recent Labs  Basename 10/04/11 1432   WBC 5.8   HGB 11.0*   HCT 33.0*   MCV 77.8*   PLT 167   Radiology/Studies: No results found.  EKG: NSR, no ST-T wave changes, possible LAE  ASSESSMENT AND PLAN:   Amanda [name] is 66yo from [place] who knows no English with no prior cardiac history and PMHx significant for type 2 DM, HTN and HL who presents to [facility] ED from the office after an abnormal Lexiscan Myoview.   1. Unstable angina- worsening chest pain over the past year associated with minimal exertion, now at rest. Patient has cardiac risk factors noted above. Abnormal Lexiscan Myoview today in the office. She experienced chest pain and hypotension. This resolved with Aminophylline and NS bolus, respectively. Myoview imaging was reviewed by Dr. Ron Parker revealing inferolateral scar with peri-infarct ischemia.  Procedural EKG reveals lateral ST depressions V5, V6. Labs pending.    - CBC, BMET, troponin-I ordered  - Pre-cath orders in, will proceed if labs return normal  - Will admit to telemetry  - Cycle cardiac enzymes  - NPO now, heart healthy diet after cath  - Continue ASA, ARB and statin   - Add low-dose BB  - Further recommendations by cathing MD  - Will obtain CXR  2. Type 2 DM  - Hold metformin in anticipation for cath  - Continue Glipizide, add SSI orderset  3. Hypertension  - Continue ARB  - Monitor on low-dose BB  4. Hyperlipidemia  - Continue statin   5. GERD  - Continue PPI    Signed, R. Valeria Batman, PA-C 10/04/2011, 3:12 PM   Patient seen and examined and history reviewed. Agree with above findings and plan. 66 yo [place] female sent from the office today for abnormal stress test and ongoing chest pain. Son acts as Astronomer. She does not speak [place]. Reports still low level of chest pain. Myoview today associated with hypotension and lateral ST changes with lexiscan. There a moderately large inferolateral defect with fixed and reversible components. She Dr. Elmarie Shiley recent  note for complete H&P details. Baseline lab work pending. If labs [facility] plan on proceeding with cardiac cath +/- PCI today. Procedure explained in detail to son and he relayed to his mother. She does have an apical murmur. May need an Echo depending on cath data.  Collier Salina [facility] 10/04/2011 3:26 PM

## 2011-10-04 NOTE — ED Notes (Signed)
Patient resting quietly, calm with unlabored respirations.  Cardiology at bedside with patient.

## 2011-10-04 NOTE — ED Notes (Signed)
Patient states that she has been having SOB, chest pain and dizziness x 1 year.  Patient advises that the same symptoms arose during her stress test at [facility] today.  Patient claims this is an intermittent issue that is frequent for patient.

## 2011-10-04 NOTE — Progress Notes (Signed)
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   Medical History: Past Medical History  Diagnosis Date  . Hypertension   . Hyperlipidemia   . Diabetes mellitus   . Abdominal pain   . Chest pain     Atypical    Medications:  Prescriptions prior to admission  Medication Sig Dispense Refill  . aspirin 81 MG tablet Take 81 mg by mouth daily.      Marland Kitchen esomeprazole (NEXIUM) 40 MG capsule Take 40 mg by mouth daily before breakfast.      . glipiZIDE (GLUCOTROL) 5 MG tablet Take 5 mg by mouth daily.      Marland Kitchen ibuprofen (ADVIL,MOTRIN) 600 MG tablet Take 600 mg by mouth every 6 (six) hours as needed. For pain      . metFORMIN (GLUCOPHAGE) 500 MG tablet Take 500 mg by mouth 2 (two) times daily with a meal.      . olmesartan (BENICAR) 40 MG tablet Take 40 mg by mouth daily.      . pravastatin (PRAVACHOL) 80 MG tablet Take 80 mg by mouth daily.        Assessment: 66 y.o. female presents from office after abnormal myoview. Pt is [place] and doesn't know any [place] - son is translating. S/p cath today which showed critical distal left main disease with multivessel CAD. Needs urgent CABG per Dr. Burt Knack but son says pt will not consent. CVTS to consult. To begin heparin 8 hours post sheath removal. Sheath removed ~1700  Goal of Therapy:  Heparin level 0.3-0.7 units/ml Monitor  platelets by anticoagulation protocol: Yes   Plan:  1. Heparin gtt at 700 units/hr to start at 0100 5/23 (8 hr post sheath removal). No bolus 2. Heparin level at 0700 (6 hours post start) 3. Daily heparin level and CBC beginning 5/24 4. F/u plans for possible CABG 5. Will d/c lovenox  Sherlon Handing, PharmD, BCPS Clinical pharmacist, pager [phone] 10/04/2011,7:06 PM

## 2011-10-04 NOTE — ED Notes (Signed)
Patient does not speak any [place].  Patient's son at bedside and does speak [place] well.  Per patient, with son translating, patient was having chest pain, SOB and dizziness x 1 year.  Patient claims that she does not feel differently than she has for the last year.  Her pain is midsternal that radiates directly to her back.  Pain is described as 8/10 when she has the pain.  Because pressures dropped substantially during testing, patient was given aminophylline 75 mg at [facility].  Per echo, EF 67%.

## 2011-10-04 NOTE — ED Notes (Signed)
Patient next scheduled for cardiac cath per MD and cardiac RN.

## 2011-10-04 NOTE — CV Procedure (Signed)
   Cardiac Catheterization Procedure Note  Name: Amanda [name] MRN: [id number] DOB: July 30, 1945  Procedure: Left Heart Cath, Selective Coronary Angiography, LV angiography, abdominal aortic angiography  Indication: Unstable angina, high-risk Myoview   Procedural details: The right groin was prepped, draped, and anesthetized with 1% lidocaine. Using modified Seldinger technique, a 5 French sheath was introduced into the right femoral artery. Standard Judkins catheters were used for coronary angiography and left ventriculography. Abdominal aortography was performed. Catheter exchanges were performed over a guidewire. There were no immediate procedural complications. The patient was transferred to the post catheterization recovery area for further monitoring.  Procedural Findings: Hemodynamics:  AO 124/66 LV 127/4   Coronary angiography: Coronary dominance: right  Left mainstem: Critical stenosis of the distal left main of 90% is present. This involves the trifurcation of the distal LM.  Left anterior descending (LAD): Severe proximal stenosis of 80%, including ostial disease. Vessel reaches the LV apex.  Left circumflex (LCx): Dominant vessel, large caliber. Intermediate branch with moderate diffuse disease. AV circ supplies a large OM 1 with nonobstructive disease and a left PDA with diffuse disease.  Right coronary artery (RCA): Small, nondominant vessel. Total occlusion of the proximal vessel.   Left ventriculography: Left ventricular systolic function is normal, LVEF is estimated at 55-65%, there is catheter-induced mitral regurgitation.  Abdominal aortogram: patent renals. Patent aortoiliacs, marked aortic tortuosity.  Final Conclusions:   1. 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 2. Preserved LV function  Recommendations: Pt clearly requires urgent/emergent CABG. However, her son indicates that she will not agree to this treatment. I have called [facility] to  discuss with her and I will discuss further with the patient and son. High-risk PCI could be performed, but with distal LM trifurcation disease, this is clearly an inferior treatment option.  Sherren Mocha 10/04/2011, 4:47 PM

## 2011-10-04 NOTE — ED Notes (Signed)
Son at bedside to translate for patient.

## 2011-10-04 NOTE — ED Notes (Signed)
Patient currently awaiting cardiology to see her.  Patient is undressed, gowned, lined, and ready to go to Cath upon arrival of cardiology at their request.

## 2011-10-04 NOTE — ED Notes (Signed)
Patient taken to restroom with EMT.  Patient tolerated well.

## 2011-10-04 NOTE — ED Notes (Signed)
Patient transported to X-ray 

## 2011-10-04 NOTE — ED Notes (Signed)
Care transferred and report given to Dorian Pod, [place].

## 2011-10-04 NOTE — ED Provider Notes (Signed)
Pt seen in screening Sent to ED from cardiology for likely admission She is awake/alert, no distress   Date: 10/04/2011  Rate: 70  Rhythm: normal sinus rhythm  QRS Axis: normal  Intervals: normal  ST/T Wave abnormalities: nonspecific ST changes  Conduction Disutrbances:none     Sharyon Cable, MD 10/04/11 1336

## 2011-10-04 NOTE — ED Notes (Signed)
Anderson Malta, cardiac RN at bedside to speak with patient.

## 2011-10-04 NOTE — Progress Notes (Signed)
Amanda [name] [id number] [phone]  Cardiology Nuclear Med Study  Amanda [name] is a 65 y.o. female     MRN : [id number]     DOB: 1945-11-11  Procedure Date: 10/04/2011  Nuclear Med Background Indication for Stress Test:  Evaluation for Ischemia and Abnormal EKG History:  10/04/2011 ECHO Cardiac Risk Factors: Hypertension, Lipids and NIDDM  Symptoms:  Chest Pain and Chest Pressure.    Nuclear Pre-Procedure Caffeine/Decaff Intake:  None NPO After: 6:00pm   Lungs:  clear O2 Sat: 98% on room air. IV 0.9% NS with Angio Cath:  20g  IV Site: R Antecubital x 1, tolerated well IV Started by:  Irven Baltimore, RN  Chest Size (in):  36 Cup Size: C  Height: 4\' 8"  (1.422 m)  Weight:  107 lb (48.535 kg)  BMI:  Body mass index is 23.99 kg/(m^2). Tech Comments:  No diabetic medication today. Patient speaks [place] language, son interpreted. 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 During the test her BP dropped, and was given a 250 cc NS bolus. She remained with chest pain so she was given Aminophylline 75 mg IV and was relieved of all symptoms. Pictures checked showing changes indicating ischemia. DOD J.Katz consulted and stated that she was to be seen and sent out.  The patient then started having chest pain radiating to her back and mouth. This information was given to [facility] and he then advised to have EMS come and get her and take her to [facility] ED and he would call the team at [facility]. EMS came and transported her to the ED. Last BP prior to leaving the office was 138/80 Pulse Ox 100% on RA.     Nuclear Med Study 1 or 2 day study: 1 day  Stress Test Type:  Carlton Adam  Reading MD: Mertie Moores, MD  Order Authorizing Provider:  Mertie Moores, MD  Resting Radionuclide: Technetium 67m Tetrofosmin  Resting Radionuclide Dose: 11.0 mCi   Stress Radionuclide:  Technetium 1m Tetrofosmin  Stress Radionuclide Dose: 32.5 mCi           Stress Protocol Rest  HR: 63 Stress HR: 103  Rest BP: 127/65 Stress BP: 127/64  Exercise Time (min): n/a METS: n/a   Predicted Max HR: 154 bpm % Max HR: 66.88 bpm Rate Pressure Product: 13081   Dose of Adenosine (mg):  n/a Dose of Lexiscan: 0.4 mg  Dose of Atropine (mg): n/a Dose of Dobutamine: n/a mcg/kg/min (at max HR)  Stress Test Technologist: Perrin Maltese, EMT-P  Nuclear Technologist:  Charlton Amor, CNMT     Rest Procedure:  Myocardial perfusion imaging was performed at rest 45 minutes following the intravenous administration of Technetium 53m Tetrofosmin. Rest ECG: NSR with non-specific ST-T wave changes  Stress Procedure:  The patient received IV Lexiscan 0.4 mg over 15-seconds.  Technetium 1m Tetrofosmin injected at 30-seconds.  There were no significant changes, + sob, Dizziness, chest pain, and felt bad all over with Lexiscan.  Quantitative spect images were obtained after a 45 minute delay. Stress ECG: No significant change from baseline ECG  QPS Raw Data Images:  Normal; no motion artifact; normal heart/lung ratio. Stress Images:  There is a large severe defect in the apex and inferior lateral myocardium. The uptake in the other regions is normal. Rest Images:  There is a large severe defect in the apex and inferior lateral myocardium. The uptake in the other regions is normal Subtraction (SDS):  The  inferior and apical defect has a fix and a reversible component.   Transient Ischemic Dilatation (Normal <1.22):  1.09 Lung/Heart Ratio (Normal <0.45):  0.18  Quantitative Gated Spect Images QGS EDV:  51 ml QGS ESV:  17 ml  Impression Exercise Capacity:  Lexiscan with no exercise. BP Response:  Hypotensive blood pressure response. Clinical Symptoms:  The patient felt poorly during the whole stress test. ECG Impression:  There was slight TWI in the anterior leads with the adenosine infusion. Comparison with Prior Nuclear Study: No images to compare  Overall Impression:  Abnormal stress  nuclear study.  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    LV Ejection Fraction: 67%.  LV Wall Motion:  There is hypokinesis of the inferior lateral wall.  She was admitted to the hospital following the study due to severe dyspnea and feeling poorly.    Thayer Headings, Brooke Bonito., MD, [facility] 10/04/2011, 4:04 PM Office - [phone] Pager [phone]

## 2011-10-04 NOTE — Interval H&P Note (Signed)
History and Physical Interval Note:  10/04/2011 4:45 PM  Amanda [name]  has presented today for surgery, with the diagnosis of chest pain  The various methods of treatment have been discussed with the patient and family. After consideration of risks, benefits and other options for treatment, the patient has consented to  Procedure(s) (LRB): LEFT HEART CATHETERIZATION WITH CORONARY ANGIOGRAM (N/A) as a surgical intervention .  The patients' history has been reviewed, patient examined, no change in status, stable for surgery.  I have reviewed the patients' chart and labs.  Questions were answered to the patient's satisfaction.     Sherren Mocha  10/04/2011 4:46 PM

## 2011-10-04 NOTE — Consult Note (Signed)
[address]            [place] [address]          [phone]       Laconda Gatlin Nelson Medical Record [id number] Date of Birth: 1945-11-12  Referring: No ref. provider found Primary Care: Aurora Mask, NP, NP  Chief Complaint:    Chief Complaint  Patient presents with  . Chest Pain    History of Present Illness:     Progressive chest pain relieved by rest. Early positive Myoview stress test with hypotension and EKG changes 66 year old diabetic female nonsmoker from [place] sent for urgent cardiac catheterization by Dr. Burt Knack demonstrating 90% distal left main stenosis extending into the ostium of the LAD, ramus, and circumflex. LV function 55% EF. 2-D echo shows mild mitral rotation  Current Activity/ Functional Status: Lives with her son fairly sedentary activity level   Past Medical History  Diagnosis Date  . Hypertension   . Hyperlipidemia   . Diabetes mellitus   . Abdominal pain   . Chest pain     Atypical    No past surgical history on file.  History  Smoking status  . Never Smoker   Smokeless tobacco  . Not on file    History  Alcohol Use No    History   Social History  . Marital Status: Widowed    Spouse Name: N/A    Number of Children: 6  . Years of Education: N/A   Occupational History  . Not on file.   Social History Main Topics  . Smoking status: Never Smoker   . Smokeless tobacco: Not on file  . Alcohol Use: No  . Drug Use: No  . Sexually Active: Not Currently   Other Topics Concern  . Not on file   Social History Narrative   Lives in [place], [place] with son.     No Known Allergies  Current Facility-Administered Medications  Medication Dose Route Frequency Provider Last Rate Last Dose  . 0.9 %  sodium chloride infusion  250 mL Intravenous PRN Roger A Arguello, PA-C      . 0.9 %  sodium chloride infusion   Intravenous Continuous Sherren Mocha, MD 75 mL/hr at 10/04/11 1710     . acetaminophen (TYLENOL) tablet 650 mg  650 mg Oral Q4H PRN Roger A Arguello, PA-C      . acetaminophen (TYLENOL) tablet 650 mg  650 mg Oral Q4H PRN Sherren Mocha, MD      . aspirin chewable tablet 324 mg  324 mg Oral NOW Roger A Arguello, PA-C       Or  . aspirin suppository 300 mg  300 mg Rectal NOW Roger A Arguello, PA-C      . aspirin chewable tablet 81 mg  81 mg Oral Daily Sherren Mocha, MD      . aspirin EC tablet 325 mg  325 mg Oral Daily Sherren Mocha, MD      . bisacodyl (DULCOLAX) EC tablet 5 mg  5 mg Oral Once Donielle M Zimmerman, PA      . cefUROXime (ZINACEF) 1.5 g in dextrose 5 % 50 mL IVPB  1.5 g Intravenous To OR Sherren Mocha, MD      . cefUROXime (ZINACEF) 750 mg in dextrose 5 % 50 mL IVPB  750 mg Intravenous To OR Sherren Mocha, MD      .  chlorhexidine (HIBICLENS) 4 % liquid 4 application  60 mL Topical Once Donielle M Zimmerman, PA      . dexmedetomidine (PRECEDEX) 400 mcg in sodium chloride 0.9 % 100 mL infusion  0.1-0.7 mcg/kg/hr Intravenous To OR Sherren Mocha, MD      . DOPamine (INTROPIN) 800 mg in dextrose 5 % 250 mL infusion  2-20 mcg/kg/min Intravenous To OR Sherren Mocha, MD      . EPINEPHrine (ADRENALIN) 4,000 mcg in dextrose 5 % 250 mL infusion  0.5-20 mcg/min Intravenous To OR Sherren Mocha, MD      . fentaNYL (SUBLIMAZE) 0.05 MG/ML injection           . glipiZIDE (GLUCOTROL) tablet 5 mg  5 mg Oral Daily Roger A Arguello, PA-C      . heparin 2-0.9 UNIT/ML-% infusion           . heparin ADULT infusion 100 units/mL (25000 units/250 mL)  700 Units/hr Intravenous Continuous Juanda Chance Amend, PHARMD      . insulin aspart (novoLOG) injection 0-9 Units  0-9 Units Subcutaneous TID WC Roger A Arguello, PA-C      . insulin regular (NOVOLIN R,HUMULIN R) 1 Units/mL in sodium chloride 0.9 % 100 mL infusion   Intravenous To OR Sherren Mocha, MD      . irbesartan (AVAPRO) tablet 300 mg  300 mg Oral Daily Roger A Arguello, PA-C      . labetalol  (NORMODYNE,TRANDATE) injection 20 mg  20 mg Intravenous Once Sherren Mocha, MD   20 mg at 10/04/11 1706  . lidocaine (XYLOCAINE) 1 % injection           . magnesium sulfate (IV Push/IM) injection 40 mEq  40 mEq Other To OR Sherren Mocha, MD      . metoprolol tartrate (LOPRESSOR) tablet 12.5 mg  12.5 mg Oral BID Roger A Arguello, PA-C      . midazolam (VERSED) 2 MG/2ML injection           . nitroGLYCERIN (NITROSTAT) SL tablet 0.4 mg  0.4 mg Sublingual Q5 Min x 3 PRN Roger A Arguello, PA-C      . nitroGLYCERIN (NTG ON-CALL) 0.2 mg/mL injection           . nitroGLYCERIN 0.2 mg/mL in dextrose 5 % infusion  2-200 mcg/min Intravenous To OR Sherren Mocha, MD      . nitroglycerin-nicardipine-HEPARIN-sodium bicarbonate irrigation for artery spasm   Irrigation To OR Sherren Mocha, MD      . ondansetron [facility]) injection 4 mg  4 mg Intravenous Q6H PRN Roger A Arguello, PA-C      . ondansetron (ZOFRAN) injection 4 mg  4 mg Intravenous Q6H PRN Sherren Mocha, MD      . pantoprazole (PROTONIX) EC tablet 80 mg  80 mg Oral Q1200 Roger A Arguello, PA-C      . phenylephrine (NEO-SYNEPHRINE) 20,000 mcg in dextrose 5 % 250 mL infusion  30-200 mcg/min Intravenous To OR Sherren Mocha, MD      . potassium chloride injection 80 mEq  80 mEq Other To OR Sherren Mocha, MD      . simvastatin (ZOCOR) tablet 40 mg  40 mg Oral q1800 Roger A Arguello, PA-C      . sodium chloride 0.9 % injection 3 mL  3 mL Intravenous Q12H Roger A Arguello, PA-C      . sodium chloride 0.9 % injection 3 mL  3 mL Intravenous PRN Roger A Arguello, PA-C      .  temazepam (RESTORIL) capsule 15 mg  15 mg Oral Once PRN Nani Skillern, PA      . tranexamic acid (CYKLOKAPRON) bolus via infusion - over 30 minutes 739.5 mg  15 mg/kg Intravenous To OR Sherren Mocha, MD      . tranexamic acid (CYKLOKAPRON) pump prime solution 99 mg  2 mg/kg Intracatheter To OR Sherren Mocha, MD      . tranexamic acid (CYKLOLAPRON) 2,500 mg in sodium chloride  0.9 % 250 mL infusion  1.5 mg/kg/hr Intravenous To OR Sherren Mocha, MD      . vancomycin (VANCOCIN) 1,000 mg in sodium chloride 0.9 % 250 mL IVPB  1,000 mg Intravenous To OR Sherren Mocha, MD      . DISCONTD: ALPRAZolam Duanne Moron) tablet 0.25 mg  0.25 mg Oral BID PRN Meriel Pica, PA-C      . DISCONTD: aspirin tablet 81 mg  81 mg Oral Daily Roger A Arguello, PA-C      . DISCONTD: enoxaparin (LOVENOX) injection 40 mg  40 mg Subcutaneous QHS Roger A Arguello, PA-C      . DISCONTD: zolpidem (AMBIEN) tablet 5 mg  5 mg Oral QHS PRN Meriel Pica, PA-C       Facility-Administered Medications Ordered in Other Encounters  Medication Dose Route Frequency Provider Last Rate Last Dose  . aminophylline injection 75 mg  75 mg Intravenous Once Larey Dresser, MD   75 mg at 10/04/11 0954  . regadenoson (LEXISCAN) injection SOLN 0.4 mg  0.4 mg Intravenous Once Thayer Headings, MD   0.4 mg at 10/04/11 0930  . technetium tetrofosmin (TC-MYOVIEW) injection 11 milli Curie  11 milli Curie Intravenous Once PRN Thayer Headings, MD   [place] at 10/04/11 [phone]  . technetium tetrofosmin (TC-MYOVIEW) injection 33 milli Curie  33 milli Curie Intravenous Once PRN Thayer Headings, MD   [place] at 10/04/11 0930    Prescriptions prior to admission  Medication Sig Dispense Refill  . aspirin 81 MG tablet Take 81 mg by mouth daily.      Marland Kitchen esomeprazole (NEXIUM) 40 MG capsule Take 40 mg by mouth daily before breakfast.      . glipiZIDE (GLUCOTROL) 5 MG tablet Take 5 mg by mouth daily.      Marland Kitchen ibuprofen (ADVIL,MOTRIN) 600 MG tablet Take 600 mg by mouth every 6 (six) hours as needed. For pain      . metFORMIN (GLUCOPHAGE) 500 MG tablet Take 500 mg by mouth 2 (two) times daily with a meal.      . olmesartan (BENICAR) 40 MG tablet Take 40 mg by mouth daily.      . pravastatin (PRAVACHOL) 80 MG tablet Take 80 mg by mouth daily.        No family history on file.   Review of Systems:     Cardiac Review of  Systems: Y or N  Chest PainY [  Y  ]  Resting SOB [   ] Exertional SOB  [Y  ]  Orthopnea [  ]   Pedal Edema [   ]    Palpitations [  ] Syncope  [  ]   Presyncope [ Y  ]  General Review of Systems: [Y] = yes [  ]=no Constitional: recent weight change [  ]; anorexia [  ]; fatigue [  ]; nausea [  ]; night sweats [  ]; fever [  ]; or chills [  ];  Dental: poor dentition[  ]; Last Dentist visit: 1 yr  Eye : blurred vision [  ]; diplopia [   ]; vision changes [  ];  Amaurosis fugax[n  ]; Resp: cough [  ];  wheezing[  ];  hemoptysis[  ]; shortness of breath[  ]; paroxysmal nocturnal dyspnea[  ]; dyspnea on exertion[  ]; or orthopnea[  ];  GI:  gallstones[ n ], vomiting[  ];  dysphagia[  ]; melena[  ];  hematochezia [  ]; heartburn[  ];   Hx of  Colonoscopy[  ]; GU: kidney stones [  ]; hematuria[  ];   dysuria [  ];  nocturia[  ];  history of     obstruction [  ];             Skin: rash, swelling[  ];, hair loss[  ];  peripheral edema[  ];  or itching[  ]; Musculosketetal: myalgias[  ];  joint swelling[  ];  joint erythema[  ];  joint pain[  ];  back pain[  ];  Heme/Lymph: bruising[  ];  bleeding[n  ];  anemia[  ];  Neuro: TIA[  ];  headaches[  ];  stroke[n  ];  vertigo[  ];  seizures[  ];   paresthesias[  ];  difficulty walking[  ];  Psych:depression[  ]; anxiety[  ];  Endocrine: diabetes[y  ];  thyroid dysfunction[  ];  Immunizations: Flu [  ]; Pneumococcal[  ];  Other:  Physical Exam: BP 140/74  Pulse 74  Temp(Src) 98.4 F (36.9 C) (Oral)  Resp 16  Ht 5\' 1"  (1.549 m)  Wt 108 lb 11 oz (49.3 kg)  BMI 20.54 kg/m2  SpO2 100%  Exam Elderly African American female in cardiac cath Foley area no distress HEENT normocephalic equal pupils Neck no JVD mass or bruit Thorax no deformities breath sounds clear Cardiac regular rhythm 2/6 systolic murmur at the  apex Abdomen scaphoid nontender without pulsatile mass Extremities atrophic skin changes no edema tenderness Vascular nonpalpable pedal pulses no evidence of venous insufficiency Neurologic response to her son in [place] dialect no focal motor deficit    Diagnostic Studies & Laboratory data:     Recent Radiology Findings:   Dg Chest 2 View  10/04/2011  *RADIOLOGY REPORT*  Clinical Data: Chest pain.  CHEST - 2 VIEW  Comparison: None.  Findings: Trachea is midline.  Heart is at the upper limits of normal in size.  Thoracic aorta is calcified.  Lungs are clear though mildly hyperinflated.  No pleural fluid. There may be an old left second rib fracture.  IMPRESSION: No acute findings.  Original Report Authenticated By: Luretha Rued, M.D.      Recent Lab Findings: Lab Results  Component Value Date   WBC 5.8 10/04/2011   HGB 11.0* 10/04/2011   HCT 33.0* 10/04/2011   PLT 167 10/04/2011   GLUCOSE 88 10/04/2011   CHOL 197 01/24/2010   TRIG 143 01/24/2010   HDL 42 01/24/2010   LDLCALC 126* 01/24/2010   ALT 15 01/24/2010   AST 15 01/24/2010   NA 139 10/04/2011   K 3.9 10/04/2011   CL 100 10/04/2011   CREATININE 1.16* 10/04/2011   BUN 32* 10/04/2011   CO2 24 10/04/2011   TSH 1.114 07/22/2009   HGBA1C 7.2* 04/26/2010      Assessment / Plan:     66 year old [place] female with unstable angina.. A cardiac catheterization was performed after the patient became hypotensive during a Myoview  stress test today. She has severe distal left main stenosis. Her LV function is fairly well preserved.  I discussed the situation in great detail with the patient's son who translated the information to his mother the patient. They understand that she has severe critical left main stenosis unstable angina is at high risk for myocardial infarction and death. They understand that the best treatment is surgical coronary pressurization with bypass grafts the LAD ramus and OM. I discussed the details of surgery  including the location of the incisions, these general anesthesia and cardio point bypass, and expected recovery as well as the potential risks of bleeding, stroke, infection, MI, and death. The patient and son will discuss this and we will review his situation in the morning to prepare for surgery as it appears they will agree to proceed.       @me1 @ 10/04/2011 9:24 PM

## 2011-10-05 ENCOUNTER — Other Ambulatory Visit: Payer: Self-pay

## 2011-10-05 ENCOUNTER — Inpatient Hospital Stay (HOSPITAL_COMMUNITY): Payer: Medicaid Other

## 2011-10-05 ENCOUNTER — Encounter (HOSPITAL_COMMUNITY)
Admission: EM | Disposition: A | Discharge status: Home / Self Care | Payer: Self-pay | Source: Home / Self Care | Attending: Cardiothoracic Surgery

## 2011-10-05 ENCOUNTER — Inpatient Hospital Stay (HOSPITAL_COMMUNITY): Payer: Medicaid Other | Admitting: Anesthesiology

## 2011-10-05 ENCOUNTER — Encounter (HOSPITAL_COMMUNITY): Payer: Self-pay | Admitting: Anesthesiology

## 2011-10-05 DIAGNOSIS — Z01810 Encounter for preprocedural cardiovascular examination: Secondary | ICD-10-CM

## 2011-10-05 DIAGNOSIS — I200 Unstable angina: Secondary | ICD-10-CM

## 2011-10-05 DIAGNOSIS — I2510 Atherosclerotic heart disease of native coronary artery without angina pectoris: Secondary | ICD-10-CM

## 2011-10-05 HISTORY — PX: CORONARY ARTERY BYPASS GRAFT: SHX141

## 2011-10-05 LAB — URINALYSIS, ROUTINE W REFLEX MICROSCOPIC
Bilirubin Urine: NEGATIVE
Glucose, UA: 500 mg/dL — AB
Hgb urine dipstick: NEGATIVE
Ketones, ur: NEGATIVE mg/dL
Nitrite: NEGATIVE
Protein, ur: NEGATIVE mg/dL
Specific Gravity, Urine: 1.022 (ref 1.005–1.030)
Urobilinogen, UA: 0.2 mg/dL (ref 0.0–1.0)
pH: 7 (ref 5.0–8.0)

## 2011-10-05 LAB — POCT I-STAT 3, ART BLOOD GAS (G3+)
Acid-base deficit: 1 mmol/L (ref 0.0–2.0)
Bicarbonate: 22.6 mEq/L (ref 20.0–24.0)
O2 Saturation: 96 %
Patient temperature: 98.6
Patient temperature: 35
TCO2: 25 mmol/L (ref 0–100)
TCO2: 28 mmol/L (ref 0–100)
pCO2 arterial: 34.7 mmHg — ABNORMAL LOW (ref 35.0–45.0)
pCO2 arterial: 47.9 mmHg — ABNORMAL HIGH (ref 35.0–45.0)
pCO2 arterial: 32.3 mmHg — ABNORMAL LOW (ref 35.0–45.0)
pH, Arterial: 7.444 — ABNORMAL HIGH (ref 7.350–7.400)
pH, Arterial: 7.355 (ref 7.350–7.400)
pH, Arterial: 7.414 — ABNORMAL HIGH (ref 7.350–7.400)
pH, Arterial: 7.482 — ABNORMAL HIGH (ref 7.350–7.400)
pO2, Arterial: 403 mmHg — ABNORMAL HIGH (ref 80.0–100.0)

## 2011-10-05 LAB — POCT I-STAT 4, (NA,K, GLUC, HGB,HCT)
Glucose, Bld: 152 mg/dL — ABNORMAL HIGH (ref 70–99)
Glucose, Bld: 104 mg/dL — ABNORMAL HIGH (ref 70–99)
Glucose, Bld: 119 mg/dL — ABNORMAL HIGH (ref 70–99)
Glucose, Bld: 157 mg/dL — ABNORMAL HIGH (ref 70–99)
HCT: 25 % — ABNORMAL LOW (ref 36.0–46.0)
HCT: 20 % — ABNORMAL LOW (ref 36.0–46.0)
HCT: 22 % — ABNORMAL LOW (ref 36.0–46.0)
HCT: 23 % — ABNORMAL LOW (ref 36.0–46.0)
HCT: 25 % — ABNORMAL LOW (ref 36.0–46.0)
HCT: 21 % — ABNORMAL LOW (ref 36.0–46.0)
Hemoglobin: 8.5 g/dL — ABNORMAL LOW (ref 12.0–15.0)
Hemoglobin: 7.8 g/dL — ABNORMAL LOW (ref 12.0–15.0)
Potassium: 3.8 mEq/L (ref 3.5–5.1)
Potassium: 3.6 mEq/L (ref 3.5–5.1)
Potassium: 4 mEq/L (ref 3.5–5.1)
Potassium: 5.1 mEq/L (ref 3.5–5.1)
Sodium: 143 mEq/L (ref 135–145)
Sodium: 143 mEq/L (ref 135–145)
Sodium: 141 mEq/L (ref 135–145)

## 2011-10-05 LAB — HEMOGLOBIN A1C
Hgb A1c MFr Bld: 6.2 % — ABNORMAL HIGH (ref <5.7)
Mean Plasma Glucose: 131 mg/dL — ABNORMAL HIGH (ref <117)

## 2011-10-05 LAB — PROTIME-INR: Prothrombin Time: 17.8 seconds — ABNORMAL HIGH (ref 11.6–15.2)

## 2011-10-05 LAB — HEMOGLOBIN AND HEMATOCRIT, BLOOD
HCT: 25.4 % — ABNORMAL LOW (ref 36.0–46.0)
Hemoglobin: 8.9 g/dL — ABNORMAL LOW (ref 12.0–15.0)

## 2011-10-05 LAB — GLUCOSE, CAPILLARY
Glucose-Capillary: 74 mg/dL (ref 70–99)
Glucose-Capillary: 126 mg/dL — ABNORMAL HIGH (ref 70–99)
Glucose-Capillary: 115 mg/dL — ABNORMAL HIGH (ref 70–99)

## 2011-10-05 LAB — PLATELET COUNT: Platelets: 63 10*3/uL — ABNORMAL LOW (ref 150–400)

## 2011-10-05 LAB — CBC
Platelets: 110 10*3/uL — ABNORMAL LOW (ref 150–400)
RBC: 3.34 MIL/uL — ABNORMAL LOW (ref 3.87–5.11)
WBC: 7.6 10*3/uL (ref 4.0–10.5)

## 2011-10-05 LAB — URINE MICROSCOPIC-ADD ON

## 2011-10-05 LAB — POCT I-STAT, CHEM 8
Calcium, Ion: 1.17 mmol/L (ref 1.12–1.32)
Chloride: 106 mEq/L (ref 96–112)
HCT: 27 % — ABNORMAL LOW (ref 36.0–46.0)
Hemoglobin: 9.2 g/dL — ABNORMAL LOW (ref 12.0–15.0)

## 2011-10-05 LAB — APTT: aPTT: 37 seconds (ref 24–37)

## 2011-10-05 SURGERY — CORONARY ARTERY BYPASS GRAFTING (CABG)
Anesthesia: General | Site: Chest | Wound class: Clean

## 2011-10-05 MED ORDER — ONDANSETRON HCL 4 MG/2ML IJ SOLN
4.0000 mg | Freq: Four times a day (QID) | INTRAMUSCULAR | Status: DC | PRN
  Administered 2011-10-06 (×2): 4 mg via INTRAVENOUS
  Filled 2011-10-05 (×2): qty 2

## 2011-10-05 MED ORDER — ACETAMINOPHEN 160 MG/5ML PO SOLN: 975.0000 mg | Freq: Four times a day (QID) | ORAL | Status: DC

## 2011-10-05 MED ORDER — HEMOSTATIC AGENTS (NO CHARGE) OPTIME
TOPICAL | Status: DC | PRN
  Administered 2011-10-05: 1 via TOPICAL

## 2011-10-05 MED ORDER — MORPHINE SULFATE 2 MG/ML IJ SOLN
1.0000 mg | INTRAMUSCULAR | Status: AC | PRN
  Administered 2011-10-05 – 2011-10-06 (×2): 1 mg via INTRAVENOUS
  Administered 2011-10-06: 2 mg via INTRAVENOUS
  Filled 2011-10-05 (×2): qty 1

## 2011-10-05 MED ORDER — MIDAZOLAM HCL 5 MG/5ML IJ SOLN
INTRAMUSCULAR | Status: DC | PRN
  Administered 2011-10-05: 6 mg via INTRAVENOUS
  Administered 2011-10-05: 2 mg via INTRAVENOUS
  Administered 2011-10-05: 1.5 mg via INTRAVENOUS
  Administered 2011-10-05: 0.5 mg via INTRAVENOUS

## 2011-10-05 MED ORDER — POTASSIUM CHLORIDE 10 MEQ/50ML IV SOLN
10.0000 meq | INTRAVENOUS | Status: AC
  Administered 2011-10-05 (×3): 10 meq via INTRAVENOUS

## 2011-10-05 MED ORDER — DEXTROSE 5 % IV SOLN
1.5000 g | Freq: Two times a day (BID) | INTRAVENOUS | Status: AC
  Administered 2011-10-06 – 2011-10-07 (×4): 1.5 g via INTRAVENOUS
  Filled 2011-10-05 (×4): qty 1.5

## 2011-10-05 MED ORDER — LACTATED RINGERS IV SOLN
INTRAVENOUS | Status: DC | PRN
  Administered 2011-10-05 (×2): via INTRAVENOUS

## 2011-10-05 MED ORDER — MAGNESIUM SULFATE 40 MG/ML IJ SOLN
4.0000 g | Freq: Once | INTRAMUSCULAR | Status: AC
  Administered 2011-10-05: 4 g via INTRAVENOUS
  Filled 2011-10-05: qty 100

## 2011-10-05 MED ORDER — SODIUM CHLORIDE 0.9 % IV SOLN: 250.0000 mL | INTRAVENOUS | Status: DC

## 2011-10-05 MED ORDER — ASPIRIN EC 325 MG PO TBEC
325.0000 mg | DELAYED_RELEASE_TABLET | Freq: Every day | ORAL | Status: DC
  Administered 2011-10-06 – 2011-10-14 (×8): 325 mg via ORAL
  Filled 2011-10-05 (×9): qty 1

## 2011-10-05 MED ORDER — ACETAMINOPHEN 650 MG RE SUPP: 650.0000 mg | RECTAL | Status: AC

## 2011-10-05 MED ORDER — BISACODYL 5 MG PO TBEC
10.0000 mg | DELAYED_RELEASE_TABLET | Freq: Every day | ORAL | Status: DC
  Administered 2011-10-06 – 2011-10-11 (×4): 10 mg via ORAL
  Filled 2011-10-05 (×4): qty 2

## 2011-10-05 MED ORDER — PROTAMINE SULFATE 10 MG/ML IV SOLN
INTRAVENOUS | Status: DC | PRN
  Administered 2011-10-05: 280 mg via INTRAVENOUS

## 2011-10-05 MED ORDER — ACETAMINOPHEN 160 MG/5ML PO SOLN
650.0000 mg | ORAL | Status: AC
  Administered 2011-10-05: 650 mg

## 2011-10-05 MED ORDER — INSULIN REGULAR BOLUS VIA INFUSION
0.0000 [IU] | Freq: Three times a day (TID) | INTRAVENOUS | Status: DC
  Filled 2011-10-05: qty 10

## 2011-10-05 MED ORDER — LACTATED RINGERS IV SOLN: 500.0000 mL | Freq: Once | INTRAVENOUS | Status: AC | PRN

## 2011-10-05 MED ORDER — METOPROLOL TARTRATE 25 MG/10 ML ORAL SUSPENSION
12.5000 mg | Freq: Two times a day (BID) | ORAL | Status: DC
  Filled 2011-10-05 (×11): qty 5

## 2011-10-05 MED ORDER — POTASSIUM CHLORIDE 10 MEQ/50ML IV SOLN
10.0000 meq | Freq: Once | INTRAVENOUS | Status: AC
  Administered 2011-10-06: 10 meq via INTRAVENOUS

## 2011-10-05 MED ORDER — FAMOTIDINE IN NACL 20-0.9 MG/50ML-% IV SOLN
20.0000 mg | Freq: Two times a day (BID) | INTRAVENOUS | Status: AC
  Administered 2011-10-05 – 2011-10-06 (×2): 20 mg via INTRAVENOUS
  Filled 2011-10-05: qty 50

## 2011-10-05 MED ORDER — SODIUM BICARBONATE 8.4 % IV SOLN
INTRAVENOUS | Status: DC
  Filled 2011-10-05: qty 2.5

## 2011-10-05 MED ORDER — SODIUM CHLORIDE 0.9 % IJ SOLN
3.0000 mL | Freq: Two times a day (BID) | INTRAMUSCULAR | Status: DC
  Administered 2011-10-06 – 2011-10-10 (×8): 3 mL via INTRAVENOUS
  Administered 2011-10-10: 20 mL via INTRAVENOUS
  Administered 2011-10-11: 10 mL via INTRAVENOUS

## 2011-10-05 MED ORDER — PHENYLEPHRINE HCL 10 MG/ML IJ SOLN
0.0000 ug/min | INTRAVENOUS | Status: DC
  Filled 2011-10-05: qty 2

## 2011-10-05 MED ORDER — ROCURONIUM BROMIDE 100 MG/10ML IV SOLN
INTRAVENOUS | Status: DC | PRN
  Administered 2011-10-05: 20 mg via INTRAVENOUS
  Administered 2011-10-05: 60 mg via INTRAVENOUS
  Administered 2011-10-05: 40 mg via INTRAVENOUS

## 2011-10-05 MED ORDER — SODIUM CHLORIDE 0.9 % IV SOLN
INTRAVENOUS | Status: DC
  Administered 2011-10-05: 2.1 [IU]/h via INTRAVENOUS
  Filled 2011-10-05: qty 1

## 2011-10-05 MED ORDER — SODIUM CHLORIDE 0.45 % IV SOLN
INTRAVENOUS | Status: DC
  Administered 2011-10-05: 21:00:00 via INTRAVENOUS
  Administered 2011-10-06: 20 mL/h via INTRAVENOUS

## 2011-10-05 MED ORDER — SODIUM CHLORIDE 0.9 % IJ SOLN: 3.0000 mL | INTRAMUSCULAR | Status: DC | PRN

## 2011-10-05 MED ORDER — PANTOPRAZOLE SODIUM 40 MG PO TBEC
40.0000 mg | DELAYED_RELEASE_TABLET | Freq: Every day | ORAL | Status: DC
  Administered 2011-10-07 – 2011-10-14 (×7): 40 mg via ORAL
  Filled 2011-10-05 (×7): qty 1

## 2011-10-05 MED ORDER — DOCUSATE SODIUM 100 MG PO CAPS
200.0000 mg | ORAL_CAPSULE | Freq: Every day | ORAL | Status: DC
  Administered 2011-10-06 – 2011-10-14 (×7): 200 mg via ORAL
  Filled 2011-10-05 (×7): qty 2

## 2011-10-05 MED ORDER — LACTATED RINGERS IV SOLN
INTRAVENOUS | Status: DC
  Administered 2011-10-05: 14:00:00 via INTRAVENOUS

## 2011-10-05 MED ORDER — SODIUM CHLORIDE 0.9 % IV SOLN
INTRAVENOUS | Status: DC
  Administered 2011-10-05: 20 mL via INTRAVENOUS

## 2011-10-05 MED ORDER — ALBUTEROL SULFATE HFA 108 (90 BASE) MCG/ACT IN AERS
2.0000 | INHALATION_SPRAY | RESPIRATORY_TRACT | Status: DC | PRN
Start: 2011-10-05 — End: 2011-10-11
  Filled 2011-10-05: qty 6.7

## 2011-10-05 MED ORDER — SODIUM CHLORIDE 0.9 % IV SOLN
20.0000 ug | Freq: Once | INTRAVENOUS | Status: DC
  Filled 2011-10-05: qty 5

## 2011-10-05 MED ORDER — DOPAMINE-DEXTROSE 3.2-5 MG/ML-% IV SOLN
INTRAVENOUS | Status: DC | PRN
  Administered 2011-10-05: 3 ug/kg/min via INTRAVENOUS

## 2011-10-05 MED ORDER — METOPROLOL TARTRATE 12.5 MG HALF TABLET
12.5000 mg | ORAL_TABLET | Freq: Two times a day (BID) | ORAL | Status: DC
  Administered 2011-10-06 – 2011-10-09 (×6): 12.5 mg via ORAL
  Filled 2011-10-05 (×11): qty 1

## 2011-10-05 MED ORDER — DOPAMINE-DEXTROSE 3.2-5 MG/ML-% IV SOLN: 2.5000 ug/kg/min | INTRAVENOUS | Status: DC

## 2011-10-05 MED ORDER — FENTANYL CITRATE 0.05 MG/ML IJ SOLN
INTRAMUSCULAR | Status: DC | PRN
  Administered 2011-10-05: 475 ug via INTRAVENOUS
  Administered 2011-10-05: 250 ug via INTRAVENOUS
  Administered 2011-10-05: 25 ug via INTRAVENOUS
  Administered 2011-10-05: 250 ug via INTRAVENOUS

## 2011-10-05 MED ORDER — OXYCODONE HCL 5 MG PO TABS
5.0000 mg | ORAL_TABLET | ORAL | Status: DC | PRN
Start: 2011-10-05 — End: 2011-10-06
  Administered 2011-10-06 (×2): 5 mg via ORAL
  Filled 2011-10-05 (×2): qty 1

## 2011-10-05 MED ORDER — SODIUM CHLORIDE 0.9 % IJ SOLN
OROMUCOSAL | Status: DC | PRN
  Administered 2011-10-05: 18:00:00 via TOPICAL

## 2011-10-05 MED ORDER — SODIUM CHLORIDE 0.9 % IV SOLN
0.1000 ug/kg/h | INTRAVENOUS | Status: DC
  Filled 2011-10-05: qty 2

## 2011-10-05 MED ORDER — ALBUMIN HUMAN 5 % IV SOLN
250.0000 mL | INTRAVENOUS | Status: AC | PRN
  Administered 2011-10-05: 250 mL via INTRAVENOUS

## 2011-10-05 MED ORDER — METOPROLOL TARTRATE 1 MG/ML IV SOLN
2.5000 mg | INTRAVENOUS | Status: DC | PRN
  Filled 2011-10-05: qty 5

## 2011-10-05 MED ORDER — LACTATED RINGERS IV SOLN
INTRAVENOUS | Status: DC | PRN
  Administered 2011-10-05: 14:00:00 via INTRAVENOUS

## 2011-10-05 MED ORDER — MORPHINE SULFATE 2 MG/ML IJ SOLN
2.0000 mg | INTRAMUSCULAR | Status: DC | PRN
  Administered 2011-10-06 (×2): 2 mg via INTRAVENOUS
  Filled 2011-10-05 (×2): qty 1

## 2011-10-05 MED ORDER — PROPOFOL 10 MG/ML IV EMUL
INTRAVENOUS | Status: DC | PRN
  Administered 2011-10-05: 90 mg via INTRAVENOUS

## 2011-10-05 MED ORDER — ALBUTEROL SULFATE (5 MG/ML) 0.5% IN NEBU
2.5000 mg | INHALATION_SOLUTION | Freq: Once | RESPIRATORY_TRACT | Status: AC
  Administered 2011-10-05: 2.5 mg via RESPIRATORY_TRACT

## 2011-10-05 MED ORDER — SODIUM CHLORIDE 0.9 % IV SOLN
INTRAVENOUS | Status: DC | PRN
  Administered 2011-10-05: 19:00:00 via INTRAVENOUS

## 2011-10-05 MED ORDER — VANCOMYCIN HCL IN DEXTROSE 1-5 GM/200ML-% IV SOLN
1000.0000 mg | Freq: Once | INTRAVENOUS | Status: AC
  Administered 2011-10-06: 1000 mg via INTRAVENOUS
  Filled 2011-10-05: qty 200

## 2011-10-05 MED ORDER — 0.9 % SODIUM CHLORIDE (POUR BTL) OPTIME
TOPICAL | Status: DC | PRN
  Administered 2011-10-05: 5000 mL

## 2011-10-05 MED ORDER — HEPARIN SODIUM (PORCINE) 1000 UNIT/ML IJ SOLN
INTRAMUSCULAR | Status: DC | PRN
  Administered 2011-10-05: 9000 [IU] via INTRAVENOUS
  Administered 2011-10-05: 5000 [IU] via INTRAVENOUS

## 2011-10-05 MED ORDER — ACETAMINOPHEN 500 MG PO TABS
1000.0000 mg | ORAL_TABLET | Freq: Four times a day (QID) | ORAL | Status: AC
  Administered 2011-10-06 – 2011-10-10 (×15): 1000 mg via ORAL
  Filled 2011-10-05 (×19): qty 2

## 2011-10-05 MED ORDER — NITROGLYCERIN IN D5W 200-5 MCG/ML-% IV SOLN
0.0000 ug/min | INTRAVENOUS | Status: DC
  Administered 2011-10-05: 5 ug/min via INTRAVENOUS

## 2011-10-05 MED ORDER — LACTATED RINGERS IV SOLN: INTRAVENOUS | Status: DC

## 2011-10-05 MED ORDER — ALBUMIN HUMAN 5 % IV SOLN
INTRAVENOUS | Status: DC | PRN
  Administered 2011-10-05 (×2): via INTRAVENOUS

## 2011-10-05 MED ORDER — MIDAZOLAM HCL 2 MG/2ML IJ SOLN: 2.0000 mg | INTRAMUSCULAR | Status: DC | PRN

## 2011-10-05 MED ORDER — BISACODYL 10 MG RE SUPP: 10.0000 mg | Freq: Every day | RECTAL | Status: DC

## 2011-10-05 SURGICAL SUPPLY — 129 items
ADAPTER CARDIO PERF ANTE/RETRO (ADAPTER) ×3 IMPLANT
APPLIER CLIP 9.375 MED OPEN (MISCELLANEOUS)
APPLIER CLIP 9.375 SM OPEN (CLIP)
ATTRACTOMAT 16X20 MAGNETIC DRP (DRAPES) ×3 IMPLANT
BAG DECANTER FOR FLEXI CONT (MISCELLANEOUS) ×3 IMPLANT
BANDAGE ELASTIC 4 VELCRO ST LF (GAUZE/BANDAGES/DRESSINGS) ×3 IMPLANT
BANDAGE ELASTIC 6 VELCRO ST LF (GAUZE/BANDAGES/DRESSINGS) ×3 IMPLANT
BANDAGE GAUZE ELAST BULKY 4 IN (GAUZE/BANDAGES/DRESSINGS) ×3 IMPLANT
BASKET HEART  (ORDER IN 25'S) (MISCELLANEOUS) ×1
BASKET HEART (ORDER IN 25'S) (MISCELLANEOUS) ×1
BASKET HEART (ORDER IN 25S) (MISCELLANEOUS) ×1 IMPLANT
BENZOIN TINCTURE PRP APPL 2/3 (GAUZE/BANDAGES/DRESSINGS) ×3 IMPLANT
BLADE STERNUM SYSTEM 6 (BLADE) ×3 IMPLANT
BLADE SURG 12 STRL SS (BLADE) ×6 IMPLANT
BLADE SURG ROTATE 9660 (MISCELLANEOUS) ×3 IMPLANT
CANISTER SUCTION 2500CC (MISCELLANEOUS) ×3 IMPLANT
CANNULA GUNDRY RCSP 15FR (MISCELLANEOUS) ×3 IMPLANT
CANNULA VENOUS MAL SGL STG 40 (MISCELLANEOUS) IMPLANT
CANNULAE VENOUS MAL SGL STG 40 (MISCELLANEOUS)
CATH CPB KIT VANTRIGT (MISCELLANEOUS) ×3 IMPLANT
CATH ROBINSON RED A/P 18FR (CATHETERS) ×9 IMPLANT
CATH THORACIC 28FR (CATHETERS) IMPLANT
CATH THORACIC 28FR RT ANG (CATHETERS) IMPLANT
CATH THORACIC 36FR (CATHETERS) IMPLANT
CATH THORACIC 36FR RT ANG (CATHETERS) ×3 IMPLANT
CLIP APPLIE 9.375 MED OPEN (MISCELLANEOUS) IMPLANT
CLIP APPLIE 9.375 SM OPEN (CLIP) IMPLANT
CLIP FOGARTY SPRING 6M (CLIP) IMPLANT
CLIP TI MEDIUM 24 (CLIP) IMPLANT
CLIP TI WIDE RED SMALL 24 (CLIP) IMPLANT
CLOSURE STERI-STRIP 1/4X4 (GAUZE/BANDAGES/DRESSINGS) ×3 IMPLANT
CLOTH BEACON ORANGE TIMEOUT ST (SAFETY) ×3 IMPLANT
CONN Y 3/8X3/8X3/8  BEN (MISCELLANEOUS)
CONN Y 3/8X3/8X3/8 BEN (MISCELLANEOUS) IMPLANT
COVER SURGICAL LIGHT HANDLE (MISCELLANEOUS) ×6 IMPLANT
CRADLE DONUT ADULT HEAD (MISCELLANEOUS) ×3 IMPLANT
DRAIN CHANNEL 32F RND 10.7 FF (WOUND CARE) ×3 IMPLANT
DRAPE CARDIOVASCULAR INCISE (DRAPES) ×2
DRAPE SLUSH MACHINE 52X66 (DRAPES) IMPLANT
DRAPE SLUSH/WARMER DISC (DRAPES) ×3 IMPLANT
DRAPE SRG 135X102X78XABS (DRAPES) ×1 IMPLANT
DRSG COVADERM 4X14 (GAUZE/BANDAGES/DRESSINGS) ×3 IMPLANT
ELECT BLADE 4.0 EZ CLEAN MEGAD (MISCELLANEOUS) ×3
ELECT BLADE 6.5 EXT (BLADE) ×3 IMPLANT
ELECT CAUTERY BLADE 6.4 (BLADE) ×3 IMPLANT
ELECT REM PT RETURN 9FT ADLT (ELECTROSURGICAL) ×6
ELECTRODE BLDE 4.0 EZ CLN MEGD (MISCELLANEOUS) ×1 IMPLANT
ELECTRODE REM PT RTRN 9FT ADLT (ELECTROSURGICAL) ×2 IMPLANT
GLOVE BIO SURGEON STRL SZ 6 (GLOVE) ×12 IMPLANT
GLOVE BIO SURGEON STRL SZ 6.5 (GLOVE) ×4 IMPLANT
GLOVE BIO SURGEON STRL SZ7 (GLOVE) IMPLANT
GLOVE BIO SURGEON STRL SZ7.5 (GLOVE) ×6 IMPLANT
GLOVE BIO SURGEONS STRL SZ 6.5 (GLOVE) ×2
GLOVE BIOGEL PI IND STRL 6 (GLOVE) ×2 IMPLANT
GLOVE BIOGEL PI IND STRL 6.5 (GLOVE) ×2 IMPLANT
GLOVE BIOGEL PI IND STRL 7.0 (GLOVE) ×1 IMPLANT
GLOVE BIOGEL PI INDICATOR 6 (GLOVE) ×4
GLOVE BIOGEL PI INDICATOR 6.5 (GLOVE) ×4
GLOVE BIOGEL PI INDICATOR 7.0 (GLOVE) ×2
GLOVE EUDERMIC 7 POWDERFREE (GLOVE) IMPLANT
GLOVE ORTHO TXT STRL SZ7.5 (GLOVE) IMPLANT
GOWN STRL NON-REIN LRG LVL3 (GOWN DISPOSABLE) ×24 IMPLANT
HEMOSTAT POWDER SURGIFOAM 1G (HEMOSTASIS) ×9 IMPLANT
HEMOSTAT SURGICEL 2X14 (HEMOSTASIS) ×3 IMPLANT
INSERT FOGARTY 61MM (MISCELLANEOUS) IMPLANT
INSERT FOGARTY XLG (MISCELLANEOUS) IMPLANT
KIT BASIN OR (CUSTOM PROCEDURE TRAY) ×3 IMPLANT
KIT ROOM TURNOVER OR (KITS) ×3 IMPLANT
KIT SUCTION CATH 14FR (SUCTIONS) ×3 IMPLANT
KIT VASOVIEW W/TROCAR VH 2000 (KITS) ×3 IMPLANT
LEAD PACING MYOCARDI (MISCELLANEOUS) ×3 IMPLANT
MARKER GRAFT CORONARY BYPASS (MISCELLANEOUS) ×9 IMPLANT
NS IRRIG 1000ML POUR BTL (IV SOLUTION) ×15 IMPLANT
PACK OPEN HEART (CUSTOM PROCEDURE TRAY) ×3 IMPLANT
PAD ARMBOARD 7.5X6 YLW CONV (MISCELLANEOUS) ×6 IMPLANT
PENCIL BUTTON HOLSTER BLD 10FT (ELECTRODE) ×3 IMPLANT
PUNCH AORTIC ROTATE 4.0MM (MISCELLANEOUS) ×3 IMPLANT
PUNCH AORTIC ROTATE 4.5MM 8IN (MISCELLANEOUS) IMPLANT
PUNCH AORTIC ROTATE 5MM 8IN (MISCELLANEOUS) IMPLANT
SET CARDIOPLEGIA MPS 5001102 (MISCELLANEOUS) ×3 IMPLANT
SOLUTION ANTI FOG 6CC (MISCELLANEOUS) IMPLANT
SPONGE GAUZE 4X4 12PLY (GAUZE/BANDAGES/DRESSINGS) ×6 IMPLANT
SPONGE INTESTINAL PEANUT (DISPOSABLE) IMPLANT
SPONGE LAP 18X18 X RAY DECT (DISPOSABLE) ×3 IMPLANT
SPONGE LAP 4X18 X RAY DECT (DISPOSABLE) IMPLANT
SURGIFLO W/THROMBIN 8M KIT (HEMOSTASIS) ×3 IMPLANT
SUT BONE WAX W31G (SUTURE) ×3 IMPLANT
SUT MNCRL AB 4-0 PS2 18 (SUTURE) ×3 IMPLANT
SUT PROLENE 3 0 SH DA (SUTURE) IMPLANT
SUT PROLENE 3 0 SH1 36 (SUTURE) IMPLANT
SUT PROLENE 4 0 RB 1 (SUTURE) ×4
SUT PROLENE 4 0 SH DA (SUTURE) ×6 IMPLANT
SUT PROLENE 4-0 RB1 .5 CRCL 36 (SUTURE) ×2 IMPLANT
SUT PROLENE 5 0 C 1 36 (SUTURE) IMPLANT
SUT PROLENE 6 0 C 1 30 (SUTURE) ×3 IMPLANT
SUT PROLENE 6 0 CC (SUTURE) IMPLANT
SUT PROLENE 7 0 BV 1 (SUTURE) IMPLANT
SUT PROLENE 7 0 BV1 MDA (SUTURE) IMPLANT
SUT PROLENE 7 0 DA (SUTURE) IMPLANT
SUT PROLENE 7.0 RB 3 (SUTURE) ×9 IMPLANT
SUT PROLENE 8 0 BV175 6 (SUTURE) ×3 IMPLANT
SUT PROLENE BLUE 7 0 (SUTURE) ×3 IMPLANT
SUT PROLENE POLY MONO (SUTURE) IMPLANT
SUT SILK  1 MH (SUTURE)
SUT SILK 1 MH (SUTURE) IMPLANT
SUT SILK 2 0 SH CR/8 (SUTURE) ×3 IMPLANT
SUT SILK 3 0 SH CR/8 (SUTURE) IMPLANT
SUT STEEL 6MS V (SUTURE) ×6 IMPLANT
SUT STEEL STERNAL CCS#1 18IN (SUTURE) IMPLANT
SUT STEEL SZ 6 DBL 3X14 BALL (SUTURE) ×3 IMPLANT
SUT VIC AB 1 CTX 18 (SUTURE) IMPLANT
SUT VIC AB 1 CTX 36 (SUTURE) ×6
SUT VIC AB 1 CTX36XBRD ANBCTR (SUTURE) ×3 IMPLANT
SUT VIC AB 2-0 CT1 27 (SUTURE) ×2
SUT VIC AB 2-0 CT1 TAPERPNT 27 (SUTURE) ×1 IMPLANT
SUT VIC AB 2-0 CTX 27 (SUTURE) IMPLANT
SUT VIC AB 3-0 SH 27 (SUTURE)
SUT VIC AB 3-0 SH 27X BRD (SUTURE) IMPLANT
SUT VIC AB 3-0 X1 27 (SUTURE) IMPLANT
SUT VICRYL 4-0 PS2 18IN ABS (SUTURE) IMPLANT
SUTURE E-PAK OPEN HEART (SUTURE) ×3 IMPLANT
SYSTEM SAHARA CHEST DRAIN ATS (WOUND CARE) ×6 IMPLANT
TAPE CLOTH SURG 4X10 WHT LF (GAUZE/BANDAGES/DRESSINGS) ×3 IMPLANT
TOWEL OR 17X24 6PK STRL BLUE (TOWEL DISPOSABLE) ×6 IMPLANT
TOWEL OR 17X26 10 PK STRL BLUE (TOWEL DISPOSABLE) ×6 IMPLANT
TRAY FOLEY IC TEMP SENS 14FR (CATHETERS) ×3 IMPLANT
TUBING INSUFFLATION 10FT LAP (TUBING) ×3 IMPLANT
UNDERPAD 30X30 INCONTINENT (UNDERPADS AND DIAPERS) ×3 IMPLANT
WATER STERILE IRR 1000ML POUR (IV SOLUTION) ×6 IMPLANT

## 2011-10-05 NOTE — Preoperative (Signed)
Beta Blockers   Reason not to administer Beta Blockers:Not Applicable 

## 2011-10-05 NOTE — Transfer of Care (Signed)
Immediate Anesthesia Transfer of Care Note  Patient: Amanda [name]  Procedure(s) Performed: Procedure(s) (LRB): CORONARY ARTERY BYPASS GRAFTING (CABG) (N/A)  Patient Location: SICU  Anesthesia Type: General  Level of Consciousness: unresponsive  Airway & Oxygen Therapy: Patient remains intubated per anesthesia plan and Patient placed on Ventilator (see vital sign flow sheet for setting)  Post-op Assessment: Report given to PACU RN and Post -op Vital signs reviewed and stable  Post vital signs: Reviewed and stable  Complications: No apparent anesthesia complications

## 2011-10-05 NOTE — Anesthesia Preprocedure Evaluation (Addendum)
Anesthesia Evaluation   Patient awake    Reviewed: Patient's Chart, lab work & pertinent test results  Airway       Dental   Pulmonary          Cardiovascular hypertension, Pt. on medications + angina + Valvular Problems/Murmurs     Neuro/Psych    GI/Hepatic GERD-  Controlled,  Endo/Other  Diabetes mellitus-, Type 2  Renal/GU      Musculoskeletal   Abdominal   Peds  Hematology   Anesthesia Other Findings   Reproductive/Obstetrics                           Anesthesia Physical Anesthesia Plan  ASA: IV  Anesthesia Plan: General   Post-op Pain Management:    Induction: Intravenous  Airway Management Planned: Oral ETT  Additional Equipment: Arterial line, TEE and PA Cath  Intra-op Plan:   Post-operative Plan:   Informed Consent: I have reviewed the patients History and Physical, chart, labs and discussed the procedure including the risks, benefits and alternatives for the proposed anesthesia with the patient or authorized representative who has indicated his/her understanding and acceptance.     Plan Discussed with: CRNA  Anesthesia Plan Comments:         Anesthesia Quick Evaluation

## 2011-10-05 NOTE — OR Nursing (Signed)
Patient information including patient ID, birthdate, allergies, NPO status, and procedure verification verified with patient son Ardeth Sportsman in holding area by Ihor Austin.

## 2011-10-05 NOTE — Progress Notes (Signed)
*  PRELIMINARY RESULTS* Echocardiogram Echocardiogram Transesophageal has been performed.  Roxine Caddy [facility] 10/05/2011, 4:38 PM

## 2011-10-05 NOTE — OR Nursing (Signed)
Chest tincision at 1609

## 2011-10-05 NOTE — Anesthesia Procedure Notes (Signed)
Procedures Procedures: Right IJ Gordy Councilman Catheter Insertion: [id number]: The patient was identified and consent obtained.  TO was performed, and full barrier precautions were used.  The skin was anesthetized with lidocaine-4cc plain with 25g needle.  Once the vein was located with the 22 ga. needle using ultrasound guidance , the wire was inserted into the vein.  The wire location was confirmed with ultrasound.  The tissue was dilated and the 8.5 [place] cordis catheter was carefully inserted. Afterwards Gordy Councilman catheter was inserted. PA catheter at 40 cm. The patient tolerated the procedure well.   CE

## 2011-10-05 NOTE — Progress Notes (Signed)
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  Medications:  Heparin @ 700 units/hr  Assessment: 65yof resumed on heparin s/p cath for critical left main disease continues with a therapeutic heparin level. No bleeding per chart notes. This morning's CBC canceled by lab Still awaiting decision to proceed with CABG.  Goal of Therapy:  Heparin level 0.3-0.7 units/ml  Plan:  1) Continue heparin at 700 units/hr 2) Follow up heparin level, CBC in AM 3) Follow up plans for CABG  Amanda [name] 10/05/2011,9:12 AM

## 2011-10-05 NOTE — Brief Op Note (Signed)
10/04/2011 - 10/05/2011  6:08 PM  PATIENT:  Amanda [name]  66 y.o. female  PRE-OPERATIVE DIAGNOSIS:  CAD  POST-OPERATIVE DIAGNOSIS:  CAD  PROCEDURE:  Procedure(s): CORONARY ARTERY BYPASS GRAFTING (CABG) x 3 (LIMA-LAD, SVG-D, SVG-Cx), EVH right leg  SURGEON:  Surgeon(s): Ivin Poot, MD  ASSISTANT: Suzzanne Cloud, PA-C  ANESTHESIA:   general  PATIENT CONDITION:  ICU - intubated and hemodynamically stable.  PRE-OPERATIVE WEIGHT: 49.3 kg

## 2011-10-05 NOTE — Progress Notes (Signed)
    Subjective:  No pain. Unable to communicate with patient as she speaks no [place] and her son is not present.  Objective:  Vital Signs in the last 24 hours: Temp:  [97.3 F (36.3 C)-98.4 F (36.9 C)] 98.3 F (36.8 C) (05/23 0500) Pulse Rate:  [35-83] 64  (05/23 0500) Resp:  [13-23] 13  (05/23 0500) BP: (106-154)/(62-85) 117/62 mmHg (05/23 0500) SpO2:  [96 %-100 %] 100 % (05/23 0500) Weight:  [49.3 kg (108 lb 11 oz)] 49.3 kg (108 lb 11 oz) (05/22 1815)  Intake/Output from previous day: 05/22 0701 - 05/23 0700 In: 864 [P.O.:240; I.V.:624] Out: 1300 [Urine:1300]  Physical Exam: Pt is alert and oriented, NAD HEENT: normal Neck: JVP - normal, carotids 2+= without bruits Lungs: CTA bilaterally CV: RRR with grade 2/6 systolic murmur at the LSB Abd: soft, NT, Positive BS, no hepatomegaly Ext: no C/C/E, right groin clear Skin: warm/dry no rash   Lab Results:  Basename 10/04/11 1432  WBC 5.8  HGB 11.0*  PLT 167    Basename 10/04/11 1432  NA 139  K 3.9  CL 100  CO2 24  GLUCOSE 88  BUN 32*  CREATININE 1.16*    Basename 10/04/11 2021  TROPONINI <0.30   Tele: sinus rhythm no arrhythmia noted  Assessment/Plan:  1. Unstable angina with critical left main disease 2. HTN, controlled 3. Hyperlipidemia 4. Type 2 Diabetes  Difficult situation in this patient who clearly needs CABG for survival but does not want to go through surgery. Her son is coming back this am and will have further discussion with Dr Prescott Gum. I suspect they will agree to CABG. Will continue IV heparin, ASA, statin, metoprolol. She is chest pain-free.  I would offer her left main PCI if she does not agree to surgery, but not only is her coronary anatomy unfavorable, I do not think she has the insight to maintain the necessary medication compliance. CABG is clearly the superior revascularization strategy in this case.  Sherren Mocha, M.D. 10/05/2011, 7:34 AM

## 2011-10-05 NOTE — Anesthesia Postprocedure Evaluation (Signed)
  Anesthesia Post-op Note  Patient: Amanda [name]  Procedure(s) Performed: Procedure(s) (LRB): CORONARY ARTERY BYPASS GRAFTING (CABG) (N/A)  Patient Location: ICU  Anesthesia Type: General  Level of Consciousness: unresponsive  Airway and Oxygen Therapy: Patient remains intubated per anesthesia plan  Post-op Pain: none  Post-op Assessment: Post-op Vital signs reviewed, Patient's Cardiovascular Status Stable and Respiratory Function Stable  Post-op Vital Signs: Reviewed and stable  Complications: No apparent anesthesia complications

## 2011-10-05 NOTE — Progress Notes (Signed)
PFT done at bedside. Unconfirmed copy placed in shadow chart.  Philipp Deputy RRT, RCP 10/05/2011 12:27 PM

## 2011-10-06 ENCOUNTER — Inpatient Hospital Stay (HOSPITAL_COMMUNITY): Payer: Medicaid Other

## 2011-10-06 LAB — POCT I-STAT 3, ART BLOOD GAS (G3+)
Acid-Base Excess: 2 mmol/L (ref 0.0–2.0)
Bicarbonate: 26.7 mEq/L — ABNORMAL HIGH (ref 20.0–24.0)
Bicarbonate: 27.9 mEq/L — ABNORMAL HIGH (ref 20.0–24.0)
O2 Saturation: 99 %
TCO2: 29 mmol/L (ref 0–100)
pCO2 arterial: 44 mmHg (ref 35.0–45.0)
pCO2 arterial: 44.2 mmHg (ref 35.0–45.0)
pH, Arterial: 7.411 — ABNORMAL HIGH (ref 7.350–7.400)
pO2, Arterial: 123 mmHg — ABNORMAL HIGH (ref 80.0–100.0)
pO2, Arterial: 186 mmHg — ABNORMAL HIGH (ref 80.0–100.0)

## 2011-10-06 LAB — CREATININE, SERUM
Creatinine, Ser: 1.11 mg/dL — ABNORMAL HIGH (ref 0.50–1.10)
GFR calc non Af Amer: 51 mL/min — ABNORMAL LOW (ref >90)

## 2011-10-06 LAB — PREPARE PLATELET PHERESIS

## 2011-10-06 LAB — MAGNESIUM
Magnesium: 2.8 mg/dL — ABNORMAL HIGH (ref 1.5–2.5)
Magnesium: 2.1 mg/dL (ref 1.5–2.5)

## 2011-10-06 LAB — BASIC METABOLIC PANEL
Chloride: 108 mEq/L (ref 96–112)
Creatinine, Ser: 0.91 mg/dL (ref 0.50–1.10)
GFR calc Af Amer: 75 mL/min — ABNORMAL LOW (ref >90)
GFR calc non Af Amer: 64 mL/min — ABNORMAL LOW (ref >90)
Potassium: 3.8 mEq/L (ref 3.5–5.1)

## 2011-10-06 LAB — CBC
MCH: 28.4 pg (ref 26.0–34.0)
MCHC: 34.2 g/dL (ref 30.0–36.0)
MCHC: 34.6 g/dL (ref 30.0–36.0)
MCV: 80.2 fL (ref 78.0–100.0)
Platelets: 156 10*3/uL (ref 150–400)
Platelets: 130 10*3/uL — ABNORMAL LOW (ref 150–400)
RDW: 13.8 % (ref 11.5–15.5)
RDW: 14 % (ref 11.5–15.5)
WBC: 8.9 10*3/uL (ref 4.0–10.5)

## 2011-10-06 LAB — PREPARE FRESH FROZEN PLASMA: Unit division: 0

## 2011-10-06 LAB — POCT I-STAT, CHEM 8
Calcium, Ion: 1.22 mmol/L (ref 1.12–1.32)
Glucose, Bld: 287 mg/dL — ABNORMAL HIGH (ref 70–99)
HCT: 23 % — ABNORMAL LOW (ref 36.0–46.0)
Hemoglobin: 7.8 g/dL — ABNORMAL LOW (ref 12.0–15.0)

## 2011-10-06 LAB — GLUCOSE, CAPILLARY
Glucose-Capillary: 122 mg/dL — ABNORMAL HIGH (ref 70–99)
Glucose-Capillary: 104 mg/dL — ABNORMAL HIGH (ref 70–99)
Glucose-Capillary: 128 mg/dL — ABNORMAL HIGH (ref 70–99)
Glucose-Capillary: 93 mg/dL (ref 70–99)
Glucose-Capillary: 103 mg/dL — ABNORMAL HIGH (ref 70–99)
Glucose-Capillary: 117 mg/dL — ABNORMAL HIGH (ref 70–99)
Glucose-Capillary: 132 mg/dL — ABNORMAL HIGH (ref 70–99)
Glucose-Capillary: 160 mg/dL — ABNORMAL HIGH (ref 70–99)

## 2011-10-06 MED ORDER — POTASSIUM CHLORIDE 10 MEQ/50ML IV SOLN
INTRAVENOUS | Status: AC
  Filled 2011-10-06: qty 150

## 2011-10-06 MED ORDER — INSULIN GLARGINE 100 UNIT/ML ~~LOC~~ SOLN
12.0000 [IU] | Freq: Two times a day (BID) | SUBCUTANEOUS | Status: DC
  Administered 2011-10-06: 12 [IU] via SUBCUTANEOUS

## 2011-10-06 MED ORDER — TRAMADOL HCL 50 MG PO TABS
50.0000 mg | ORAL_TABLET | Freq: Four times a day (QID) | ORAL | Status: DC | PRN
  Administered 2011-10-06 – 2011-10-14 (×5): 50 mg via ORAL
  Filled 2011-10-06 (×5): qty 1

## 2011-10-06 MED ORDER — POTASSIUM CHLORIDE 10 MEQ/50ML IV SOLN
10.0000 meq | INTRAVENOUS | Status: AC | PRN
  Administered 2011-10-06 (×3): 10 meq via INTRAVENOUS

## 2011-10-06 MED ORDER — FUROSEMIDE 10 MG/ML IJ SOLN
20.0000 mg | Freq: Two times a day (BID) | INTRAMUSCULAR | Status: AC
  Administered 2011-10-06 – 2011-10-07 (×4): 20 mg via INTRAVENOUS
  Filled 2011-10-06 (×4): qty 2

## 2011-10-06 MED ORDER — PNEUMOCOCCAL VAC POLYVALENT 25 MCG/0.5ML IJ INJ: 0.5000 mL | INJECTION | INTRAMUSCULAR | Status: DC | PRN

## 2011-10-06 MED ORDER — SODIUM CHLORIDE 0.9 % IV SOLN
INTRAVENOUS | Status: DC
  Administered 2011-10-06: 4.5 [IU]/h via INTRAVENOUS
  Filled 2011-10-06: qty 1

## 2011-10-06 MED ORDER — INSULIN ASPART 100 UNIT/ML ~~LOC~~ SOLN
0.0000 [IU] | SUBCUTANEOUS | Status: DC
  Administered 2011-10-06: 12 [IU] via SUBCUTANEOUS
  Administered 2011-10-06: 2 [IU] via SUBCUTANEOUS

## 2011-10-06 MED ORDER — INSULIN GLARGINE 100 UNIT/ML ~~LOC~~ SOLN
18.0000 [IU] | Freq: Two times a day (BID) | SUBCUTANEOUS | Status: DC
  Administered 2011-10-06 – 2011-10-07 (×2): 18 [IU] via SUBCUTANEOUS

## 2011-10-06 NOTE — Progress Notes (Signed)
1 Day Post-Op Procedure(s) (LRB): CORONARY ARTERY BYPASS GRAFTING (CABG) (N/A) Subjective:                      [address]            [place] [address]          [phone]    Unstable angina with .95 L main NSR,extubated Objective: Vital signs in last 24 hours: Temp:  [95.5 F (35.3 C)-99.1 F (37.3 C)] 98.4 F (36.9 C) (05/24 0800) Pulse Rate:  [78-106] 85  (05/24 0800) Cardiac Rhythm:  [-] Normal sinus rhythm (05/24 0800) Resp:  [12-21] 20  (05/24 0800) BP: (101-149)/(61-88) 124/72 mmHg (05/24 0800) SpO2:  [98 %-100 %] 100 % (05/24 0800) Arterial Line BP: (75-167)/(51-85) 148/76 mmHg (05/24 0800) FiO2 (%):  [40 %-50 %] 40 % (05/24 0015) Weight:  [116 lb 10 oz (52.9 kg)] 116 lb 10 oz (52.9 kg) (05/24 0500)  Hemodynamic parameters for last 24 hours: PAP: (23-45)/(10-28) 45/28 mmHg CO:  [2.4 L/min-3.7 L/min] 3.7 L/min CI:  [1.6 L/min/m2-2.6 L/min/m2] 2.6 L/min/m2  Intake/Output from previous day: 05/23 0701 - 05/24 0700 In: 7044.8 [I.V.:4296.8; Blood:1396; IV [id number] Out: 6105 [Urine:5595; Chest Tube:510] Intake/Output this shift: Total I/O In: 43.5 [I.V.:43.5] Out: 220 [Urine:150; Chest Tube:70]  EXAM Breathing comfortable Lungs clear NSR Lab Results:  Basename 10/06/11 0400 10/05/11 2029 10/05/11 1930  WBC 8.9 -- 7.6  HGB 8.9* 9.2* --  HCT 26.0* 27.0* --  PLT 156 -- 110*   BMET:  Basename 10/06/11 0400 10/05/11 2029 10/04/11 1432  NA 144 146* --  K 3.8 3.4* --  CL 108 106 --  CO2 26 -- 24  GLUCOSE 101* 130* --  BUN 13 13 --  CREATININE 0.91 0.80 --  CALCIUM 8.9 -- 10.2    PT/INR:  Basename 10/05/11 1930  LABPROT 17.8*  INR 1.44   ABG    Component Value Date/Time   PHART 7.411* 10/06/2011 0159   HCO3 27.9* 10/06/2011 0159   TCO2 29 10/06/2011 0159   ACIDBASEDEF 1.0 10/05/2011 2029   O2SAT 100.0 10/06/2011 0159   CBG (last 3)   Basename 10/06/11 0739 10/06/11 0626 10/06/11 0522  GLUCAP 93 139* 128*     Assessment/Plan: S/P Procedure(s) (LRB): CORONARY ARTERY BYPASS GRAFTING (CABG) (N/A) Cont w/ postop progression   LOS: 2 days    VAN TRIGT III,Talvin Christianson 10/06/2011

## 2011-10-06 NOTE — Op Note (Signed)
Amanda [name], [name] NO.:  [id number]  MEDICAL RECORD NO.:  [id number]  LOCATION:  [id number]                     FACILITY:  [facility]  PHYSICIAN:  Amanda [name], M.D.  DATE OF BIRTH:  07-Nov-1945  DATE OF PROCEDURE:  10/05/2011 DATE OF DISCHARGE:                              OPERATIVE REPORT   OPERATIONS: 1. Coronary artery bypass grafting x3 (left internal mammary artery to     left anterior descending artery, saphenous vein graft to ramus,     saphenous vein graft to obtuse marginal 1). 2. Endoscopic harvest of the right leg, greater saphenous vein.  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  SURGEON:  Amanda Poot, MD  ASSISTANT:  Amanda Cloud, PA-C  ANESTHESIA:  General by Dr. Finis [name].  INDICATIONS:  The patient is a 66 year old diabetic female from [place] who presents with unstable angina and a highly positive stress test with hypotension and EKG changes.  An emergency cardiac catheterization demonstrated 90%-95% stenosis of the left main distally and the patient was felt to be a candidate for surgery.  I discussed the situation the patient and her son who translated the discussion regarding her left main stenosis, the risks of cardiac injury or death, and the aspects of surgical coronary revascularization to treat the severe left main stenosis.  I reviewed the procedure in detail with the son as well as the associated potential risks and expected benefits.  The patient's son understood the risk of stroke, bleeding, blood transfusion, MI, infection, and death.  After reviewing these issues, informed consent was obtained.  OPERATIVE FINDINGS: 1. The patient has small vessels due to her small body size with a BSA     of 1.4. 2. Intraoperative anemia with a starting hematocrit of 67.  The     patient received 2  units of packed cells and the pump. 3. Small size mammary artery, but with adequate flow; small size vein,     but with adequate flow.  PROCEDURE:  The patient was brought to the operating room and placed supine on the operating room table where general anesthesia was induced. A transesophageal 2D of echoprobe was placed by the Anesthesia team. This showed mild-to-moderate mitral regurgitation due to probable weak at the anterior commissure.  This was not associated with elevated PA pressures and appeared to be minimal on the ventriculogram and it was not felt that the mitral would be the best course at this time with the patient's critical left main stenosis.  The mitral regurgitation does not appear to be hemodynamically significant.  Left atrial chamber was not enlarged.  A sternal incision was made.  The saphenous vein was harvested endoscopically from the right leg.  The internal mammary artery was harvested.  The sternal retractor was placed and the pericardium opened and suspended.  Pursestrings were placed in the ascending aorta and right atrium.  Heparin was administered and the patient was cannulated and placed on cardiopulmonary bypass.  The coronary arteries were identified for grafting.  The LAD, ramus, and OM vessels were graftable. The ramus was a small vessel.  The  cardioplegia cannula was replaced for both antegrade and retrograde cold blood cardioplegia, and the patient was cooled to 32 degrees.  The aortic crossclamp was applied and 800 mL of cold blood cardioplegia was delivered with good cardioplegic arrest. Cardioplegia was delivered every 20 minutes or less.  The distal coronary anastomoses were performed.  The first distal anastomosis was the circumflex marginal.  This was a 1.5-mm vessel.  A reverse saphenous vein was sewn end-to-side with running 7-0 Prolene with good flow through the graft.  The second distal anastomosis was the ramus branch of the left  coronary.  This was a smaller 1.2-mm vessel.  A reverse saphenous vein was sewn end-to-side with running 7-0 Prolene with good flow through the graft.  The third distal anastomosis was the midportion of the LAD.  It had a proximal 90% stenosis.  The left IMA pedicle was brought through an opening in the left lateral pericardium and was brought down on the LAD and sewn end-to-side with running 8-0 Prolene.  There was good flow through the anastomosis after briefly releasing the pedicle bulldog.  The bulldog was reapplied and the pedicle was secured to the epicardium.  Cardioplegia was redosed.  While the peripheral crossclamp was still in place, two proximal vein anastomoses were performed on the ascending aorta using a 4.0-mm punch and running 7-0 Prolene.  Prior to removing the crossclamp, the air was vented from the coronary arteries with the dose of retrograde warm blood cardioplegia.  After the crossclamp was removed, the heart resumed a spontaneous rhythm.  The grafts were de-aired and each was checked and had good flow.  Hemostasis was documented at the proximal and distal sites.  The patient was rewarmed and reperfused.  Temporary pacing wires were applied.  The lungs were expanded and ventilator was resumed.  The patient was weaned off bypass on low-dose dopamine.  There was good LV function by echo.  There still was mild-to-moderate mitral regurgitation, but no worse.  Protamine was administered without adverse reaction.  The cannula was removed.  The mediastinum was irrigated.  The superior pericardial fat was closed over the aorta.  An anterior mediastinal and left pleural chest tube were placed and brought out through separate incisions.  The sternum was closed with wire.  The pectoralis fascia was closed using running Vicryl.  The subcutaneous and skin layers were closed using running Vicryl and sterile dressings were applied.  Sterile dressings were applied.  Total  cardiopulmonary bypass time was 100 minutes.     Amanda [name], M.D.     PV/MEDQ  D:  10/05/2011  T:  10/06/2011  Job:  [id number]  cc:   [facility]

## 2011-10-06 NOTE — Procedures (Addendum)
Extubation Procedure Note  Patient Details:   Name: Amanda [name] DOB: [birth year] MRN: [id number]   Airway Documentation:  Airway 7.5 mm (Active)  Secured at (cm) 21 cm 10/05/2011 11:10 PM  Measured From Lips 10/05/2011 11:10 PM  Secured Location Right 10/05/2011 11:10 PM  Secured By Pink Tape 10/05/2011 11:10 PM  Site Condition Dry 10/05/2011 11:10 PM    Evaluation  O2 sats: stable throughout Complications: No apparent complications Patient did tolerate procedure well. Bilateral Breath Sounds: Clear   Patient's vital capacity and NIF tested prior to extubation.  Vital capacity was .710 Liters and NIF was -22cmH20.  Both values were acceptable for extubation.  Patient able to vocalize: Yes  Theo Dills 10/06/2011, 1:04 AM

## 2011-10-07 ENCOUNTER — Inpatient Hospital Stay (HOSPITAL_COMMUNITY): Payer: Medicaid Other

## 2011-10-07 LAB — CBC
HCT: 23 % — ABNORMAL LOW (ref 36.0–46.0)
Hemoglobin: 7.9 g/dL — ABNORMAL LOW (ref 12.0–15.0)
MCH: 28 pg (ref 26.0–34.0)
MCHC: 34.3 g/dL (ref 30.0–36.0)
MCV: 81.6 fL (ref 78.0–100.0)
Platelets: 129 10*3/uL — ABNORMAL LOW (ref 150–400)
RBC: 2.82 MIL/uL — ABNORMAL LOW (ref 3.87–5.11)
RDW: 14.1 % (ref 11.5–15.5)
WBC: 10.2 10*3/uL (ref 4.0–10.5)

## 2011-10-07 LAB — BASIC METABOLIC PANEL
BUN: 16 mg/dL (ref 6–23)
CO2: 23 mEq/L (ref 19–32)
Calcium: 9 mg/dL (ref 8.4–10.5)
Chloride: 99 mEq/L (ref 96–112)
Creatinine, Ser: 1.29 mg/dL — ABNORMAL HIGH (ref 0.50–1.10)
GFR calc Af Amer: 49 mL/min — ABNORMAL LOW (ref >90)
GFR calc non Af Amer: 42 mL/min — ABNORMAL LOW (ref >90)
Glucose, Bld: 158 mg/dL — ABNORMAL HIGH (ref 70–99)
Potassium: 3.9 mEq/L (ref 3.5–5.1)
Sodium: 136 mEq/L (ref 135–145)

## 2011-10-07 LAB — GLUCOSE, CAPILLARY
Glucose-Capillary: 140 mg/dL — ABNORMAL HIGH (ref 70–99)
Glucose-Capillary: 231 mg/dL — ABNORMAL HIGH (ref 70–99)
Glucose-Capillary: 67 mg/dL — ABNORMAL LOW (ref 70–99)

## 2011-10-07 MED ORDER — SODIUM CHLORIDE 0.9 % IV SOLN
INTRAVENOUS | Status: DC
  Filled 2011-10-07: qty 1

## 2011-10-07 MED ORDER — FUROSEMIDE 10 MG/ML IJ SOLN
40.0000 mg | Freq: Once | INTRAMUSCULAR | Status: AC
  Administered 2011-10-07: 40 mg via INTRAVENOUS
  Filled 2011-10-07: qty 4

## 2011-10-07 MED ORDER — INSULIN ASPART 100 UNIT/ML ~~LOC~~ SOLN
0.0000 [IU] | SUBCUTANEOUS | Status: AC
  Administered 2011-10-07: 2 [IU] via SUBCUTANEOUS

## 2011-10-07 MED ORDER — INSULIN ASPART 100 UNIT/ML ~~LOC~~ SOLN
0.0000 [IU] | SUBCUTANEOUS | Status: AC
  Administered 2011-10-07: 4 [IU] via SUBCUTANEOUS

## 2011-10-07 MED ORDER — INSULIN ASPART 100 UNIT/ML ~~LOC~~ SOLN
0.0000 [IU] | SUBCUTANEOUS | Status: DC
  Administered 2011-10-07: 8 [IU] via SUBCUTANEOUS

## 2011-10-07 MED ORDER — POLYSACCHARIDE IRON COMPLEX 150 MG PO CAPS
150.0000 mg | ORAL_CAPSULE | Freq: Every day | ORAL | Status: DC
  Administered 2011-10-07 – 2011-10-14 (×7): 150 mg via ORAL
  Filled 2011-10-07 (×8): qty 1

## 2011-10-07 MED ORDER — INSULIN ASPART 100 UNIT/ML ~~LOC~~ SOLN
0.0000 [IU] | SUBCUTANEOUS | Status: DC
  Administered 2011-10-07: 2 [IU] via SUBCUTANEOUS
  Administered 2011-10-08: 4 [IU] via SUBCUTANEOUS
  Administered 2011-10-08: 2 [IU] via SUBCUTANEOUS
  Administered 2011-10-09: 4 [IU] via SUBCUTANEOUS
  Administered 2011-10-09: 8 [IU] via SUBCUTANEOUS
  Administered 2011-10-09: 4 [IU] via SUBCUTANEOUS
  Administered 2011-10-10: 8 [IU] via SUBCUTANEOUS
  Administered 2011-10-10: 2 [IU] via SUBCUTANEOUS
  Administered 2011-10-10 – 2011-10-11 (×2): 8 [IU] via SUBCUTANEOUS
  Administered 2011-10-11: 4 [IU] via SUBCUTANEOUS

## 2011-10-07 NOTE — Evaluation (Signed)
Physical Therapy Evaluation Patient Details Name: Amanda [name] MRN: [id number] DOB: 01-24-1946 Today's Date: 10/07/2011 Time:  -     PT Assessment / Plan / Recommendation Clinical Impression  Pt s/p CABG with dependencies in mobility due to sternal precautions and pain. Will benefit from PT to increase independence and assist with d/c planning    PT Assessment  Patient needs continued PT services    Follow Up Recommendations  Home health PT;Supervision/Assistance - 24 hour (if family can provide needed assist)    Barriers to Discharge Decreased caregiver support (not known)      lEquipment Recommendations  Other (comment) (TBA)    Recommendations for Other Services OT consult (unknown how much family can assist)   Frequency Min 3X/week    Precautions / Restrictions Precautions Precautions: Sternal Precaution Comments: Pt given pillow to hold with BUE to maintain precautions   Pertinent Vitals/Pain HR 83-108; on RA     Mobility  Bed Mobility Details for Bed Mobility Assistance: up in chair Transfers Transfers: Sit to Stand;Stand to Sit Sit to Stand: 4: Min assist;Without upper extremity assist;From chair/3-in-1 Stand to Sit: 4: Min guard;Without upper extremity assist;To chair/3-in-1 Details for Transfer Assistance: pt holding pillow to prevent use of arms; tactile cues and gestures to convey desired movements Ambulation/Gait Ambulation/Gait Assistance: 4: Min guard (+2 for lines only) Assistive device: Rolling walker Gait Pattern: Within Functional Limits Gait velocity: decr    Exercises     PT Diagnosis: Difficulty walking;Acute pain  PT Problem List: Decreased activity tolerance;Decreased mobility;Decreased knowledge of use of DME;Decreased knowledge of precautions;Pain PT Treatment Interventions: DME instruction;Gait training;Stair training;Functional mobility training;Therapeutic activities;Patient/family education   PT Goals Acute Rehab PT Goals PT Goal  Formulation: Patient unable to participate in goal setting (due to language barrier-interpreter doesn't speak her dialec) Time For Goal Achievement: 10/14/11 Potential to Achieve Goals: Good Pt will Roll Supine to Left Side: with supervision PT Goal: Rolling Supine to Left Side - Progress: Goal set today Pt will go Supine/Side to Sit: with supervision PT Goal: Supine/Side to Sit - Progress: Goal set today Pt will go Sit to Supine/Side: with supervision PT Goal: Sit to Supine/Side - Progress: Goal set today Pt will go Sit to Stand: with supervision;without upper extremity assist PT Goal: Sit to Stand - Progress: Goal set today Pt will Ambulate: 51 - 150 feet;with supervision;with least restrictive assistive device PT Goal: Ambulate - Progress: Goal set today Additional Goals Additional Goal #1: Pt/family will verbalize and demonstrate an understanding of patient's sternal precautions while mobilizing. PT Goal: Additional Goal #1 - Progress: Goal set today  Visit Information       Subjective Data  Subjective:  Attempted to use phone interpreter and interpreter stated pt is not speaking [place]. She could not determine what language pt speaks. Called son x 2 to try to get history and home situation with no answer. Left message Patient Stated Goal: unable   Prior Functioning  Home Living Additional Comments: TBA when family present (or returns call)    Cognition  Overall Cognitive Status: Appears within functional limits for tasks assessed/performed (following gestures (due to language barrier)) Arousal/Alertness: Awake/alert Behavior During Session: [facility] for tasks performed    Extremity/Trunk Assessment Right Lower Extremity Assessment RLE ROM/Strength/Tone: [facility] for tasks assessed Left Lower Extremity Assessment LLE ROM/Strength/Tone: [facility] for tasks assessed   Balance    End of Session PT - End of Session Equipment Utilized During Treatment: Gait belt Activity Tolerance: Patient  tolerated treatment well (HR  83-108) Patient left: in chair;with call bell/phone within reach Nurse Communication: Mobility status;Other (comment) (interpreter problems; attempt to call son)   Xsavier Seeley 10/07/2011, 3:00 PM  Pager [phone]

## 2011-10-07 NOTE — Progress Notes (Signed)
Amanda Real, MD, [facility] ABIM Board Certified in Adult Cardiovascular Medicine,Internal Medicine and Critical Care Medicine      Subjective:    Patient doing well post coronary artery bypass grafting.  She reports no chest pain or shortness of breath.  She Is very upbeat    Objective:   Weight Range:  Vital Signs:   Temp:  [97.8 F (36.6 C)-98.2 F (36.8 C)] 97.8 F (36.6 C) (05/25 0400) Pulse Rate:  [65-77] 65  (05/25 1200) Resp:  [14-28] 19  (05/25 1300) BP: (89-128)/(45-72) 101/58 mmHg (05/25 1300) SpO2:  [91 %-100 %] 96 % (05/25 1200) Weight:  [118 lb 6.2 oz (53.7 kg)] 118 lb 6.2 oz (53.7 kg) (05/25 0400) Last BM Date: 10/03/11  Weight change: Filed Weights   10/04/11 1815 10/06/11 0500 10/07/11 0400  Weight: 108 lb 11 oz (49.3 kg) 116 lb 10 oz (52.9 kg) 118 lb 6.2 oz (53.7 kg)    Intake/Output:   Intake/Output Summary (Last 24 hours) at 10/07/11 1327 Last data filed at 10/07/11 1100  Gross per 24 hour  Intake 1139.45 ml  Output   1175 ml  Net -35.55 ml     Physical Exam: General: Small framed female but in no distress Lungs clear breath sounds bilaterally without wheezing Heart: Regular rate and rhythm normal S1-S2 no murmur or rubs or gallops Abdomen: Soft and nontender Extremities: no cyanosis, clubbing, rash, edema Neuro: alert & orientedx3, cranial nerves grossly intact. moves all 4 extremities w/o difficulty. Affect pleasant  Telemetry:Normal sinus rhythm  Labs: Basic Metabolic Panel:  Lab [id number] 10/06/11 1700 10/06/11 1657 10/06/11 0400 10/05/11 2029 10/05/11 1907 10/04/11 1432  NA 136 -- 138 144 146* 143 --  K 3.9 -- 3.6 3.8 3.4* 4.3 --  CL 99 -- 98 108 106 -- 100  CO2 23 -- -- 26 -- -- 24  GLUCOSE 158* -- 287* 101* 130* 157* --  BUN 16 -- 14 13 13  -- 32*  CREATININE 1.29* 1.11* 1.20* 0.91 0.80 -- --  CALCIUM 9.0 -- -- 8.9 -- -- 10.2  MG -- 2.1 -- 2.8* -- -- --  PHOS -- -- -- -- -- -- --    Liver Function Tests: No results found  for this basename: AST:5,ALT:5,ALKPHOS:5,BILITOT:5,PROT:5,ALBUMIN:5 in the last 168 hours No results found for this basename: LIPASE:5,AMYLASE:5 in the last 168 hours No results found for this basename: AMMONIA:3 in the last 168 hours  CBC:  Lab 10/07/11 0630 10/06/11 1700 10/06/11 1657 10/06/11 0400 10/05/11 2029 10/05/11 1930 10/05/11 1801 10/04/11 1432  WBC 10.2 8.5 -- 8.9 -- 7.6 -- 5.8  NEUTROABS -- -- -- -- -- -- -- --  HGB 7.9* 8.0* 7.8* 8.9* 9.2* -- -- --  HCT 23.0* 23.1* 23.0* 26.0* 27.0* -- -- --  MCV 81.6 81.9 -- 80.2 -- 80.5 -- 77.8*  PLT 129* 130* -- 156 -- 110* 63* --    Cardiac Enzymes:  Lab 10/04/11 2021  CKTOTAL 90  CKMB 2.1  CKMBINDEX --  TROPONINI <0.30     BNP: BNP (last 3 results) No results found for this basename: PROBNP:3 in the last 8760 hours   Other results: EKG: Not obtained  Imaging: Dg Chest Portable 1 View In Am  10/07/2011  *RADIOLOGY REPORT*  Clinical Data: Postop cardiac surgery.  PORTABLE CHEST - 1 VIEW  Comparison: 10/06/2011  Findings: Swan-Ganz catheter has been removed.  Right IJ sheath remains in place.  Left chest tube has been removed.  Mediastinal drains  have been removed.  There is no evidence for pneumothorax. The patient has had median sternotomy CABG.  Heart is enlarged. There is interstitial edema, increased since prior study.  Small bilateral pleural effusions are identified, right greater than left.  Bibasilar density obscures the hemidiaphragms bilaterally, increased since prior study especially on the right.  IMPRESSION:  1.  Interval removal of mediastinal drains, Swan-Ganz catheter, and left chest tube. 2.  Increased pulmonary edema and bibasilar opacities/effusions.  Original Report Authenticated By: Glenice Bow, M.D.   Dg Chest Portable 1 View In Am  10/06/2011  *RADIOLOGY REPORT*  Clinical Data: Open heart surgery  PORTABLE CHEST - 1 VIEW  Comparison: 10/05/2011  Findings: 0612 hours.  Left chest tube remains in  place.  No evidence for pneumothorax.  Endotracheal and NG tubes have been removed in the interval.  Right IJ pulmonary catheter is in the interlobar pulmonary artery.  The midline mediastinal / pericardial drain persists. The cardiopericardial silhouette is enlarged.  Left base atelectasis has progressed in the interval.  IMPRESSION: Interval extubation and NG tube removal.  Slight increase in left base atelectasis.  Original Report Authenticated By: ERIC A. MANSELL, M.D.   Dg Chest Portable 1 View  10/05/2011  *RADIOLOGY REPORT*  Clinical Data: Postop film status post CABG.  PORTABLE CHEST - 1 VIEW  Comparison: Chest x-ray 10/04/2011.  Findings: An endotracheal tube is in place with tip 1.1 cm above the carina. A right internal jugular Cordis is present through which a Gordy Councilman catheter has been passed into the right main pulmonary artery distally.  A nasogastric tube is seen extending into the stomach cord is coiled upon itself.  There is a left chest tube in place with tip at the left apex, and side port projecting over the lateral aspect of the left hemithorax.  A mediastinal drain is also noted. Epicardial pacing wires are in place.  Lung volumes are slightly low and there are bibasilar opacities favored to represent areas of postoperative atelectasis.  Mild pulmonary venous congestion is noted without frank pulmonary edema. An irregular opacity in the right infrahilar region, which could raise concern for mild aspiration.  No definite pleural effusions or pneumothorax.  Mild enlargement of the cardiopericardial silhouette is similar to priors.  Mild widening of the mediastinal contours is within normal limits in this postoperative patient. Atherosclerotic calcifications within the arch of the aorta. Status post median sternotomy for CABG.  IMPRESSION: 1.  Support apparatus, as above.  Please take note of the low position of the endotracheal tube and consider withdrawal approximately 3-4 cm for more  optimal placement. 2. Bibasilar postoperative subsegmental atelectasis.  In addition, there is an irregular opacity in the right infrahilar region and that could indicate mild aspiration. 3.  Pulmonary venous congestion without frank pulmonary edema. 4.  Atherosclerosis.  These results were called by telephone on 10/05/2011  at  09:00 p.m. to  nurse Lauren in the surgical ICU, who verbally acknowledged these results.  Original Report Authenticated By: Etheleen Mayhew, M.D.      Medications:     Scheduled Medications:    . acetaminophen  1,000 mg Oral Q6H   Or  . acetaminophen (TYLENOL) oral liquid 160 mg/5 mL  975 mg Per Tube Q6H  . aspirin EC  325 mg Oral Daily  . bisacodyl  10 mg Oral Daily   Or  . bisacodyl  10 mg Rectal Daily  . cefUROXime (ZINACEF)  IV  1.5 g Intravenous Q12H  .  docusate sodium  200 mg Oral Daily  . furosemide  20 mg Intravenous BID  . furosemide  40 mg Intravenous Once  . insulin aspart  0-24 Units Subcutaneous Q2H  . insulin aspart  0-24 Units Subcutaneous Q2H   Followed by  . insulin aspart  0-24 Units Subcutaneous Q4H  . insulin glargine  18 Units Subcutaneous BID  . iron polysaccharides  150 mg Oral Daily  . metoprolol tartrate  12.5 mg Oral BID   Or  . metoprolol tartrate  12.5 mg Per Tube BID  . pantoprazole  40 mg Oral Q1200  . potassium chloride      . simvastatin  40 mg Oral q1800  . sodium chloride  3 mL Intravenous Q12H  . DISCONTD: insulin aspart  0-24 Units Subcutaneous Q4H  . DISCONTD: insulin aspart  0-24 Units Subcutaneous Q4H  . DISCONTD: insulin glargine  12 Units Subcutaneous BID     Infusions:    . sodium chloride 20 mL (10/05/11 2231)  . DISCONTD: sodium chloride 20 mL/hr at 10/07/11 0800  . DISCONTD: sodium chloride Stopped (10/06/11 0919)  . DISCONTD: insulin (NOVOLIN-R) infusion Stopped (10/06/11 2305)  . DISCONTD: insulin (NOVOLIN-R) infusion    . DISCONTD: phenylephrine (NEO-SYNEPHRINE) Adult infusion Stopped  (10/05/11 2220)     PRN Medications:  albumin human, albuterol, metoprolol, ondansetron (ZOFRAN) IV, pneumococcal 23 valent vaccine, potassium chloride, sodium chloride, traMADol, DISCONTD: midazolam, DISCONTD:  morphine injection, DISCONTD: oxyCODONE   Assessment:   Unstable angina with critical left main disease Status post coronary bypass grafting Hyperlipidemia Type 2 diabetes mellitus Blood loss anemia Fluid overload   Plan/Discussion:    No immediate issues for cardiology. Volume is being managed by cardiac surgery.  Patient is on Lasix. No postoperative arrhythmias.     Length of Stay: [place] 10/07/2011, 1:27 PM

## 2011-10-07 NOTE — Progress Notes (Signed)
Patient examined and record reviewed.Hemodynamics stable,labs satisfactory.Patient had stable day.Continue current care. VAN TRIGT III,Teretha Chalupa 10/07/2011

## 2011-10-07 NOTE — Progress Notes (Signed)
2 Days Post-Op Procedure(s) (LRB): CORONARY ARTERY BYPASS GRAFTING (CABG) (N/A) Subjective:                         [place]            [place] [address]          [phone]    .90 L main CABGx3 DM on lantus + SSI NSR afeb.nsr   Objective: Vital signs in last 24 hours: Temp:  [97.8 F (36.6 C)-98.2 F (36.8 C)] 97.8 F (36.6 C) (05/25 0400) Pulse Rate:  [68-84] 77  (05/25 0800) Cardiac Rhythm:  [-] Normal sinus rhythm (05/25 0800) Resp:  [14-28] 18  (05/25 0800) BP: (89-132)/(50-74) 117/72 mmHg (05/25 0800) SpO2:  [91 %-100 %] 99 % (05/25 0800) Arterial Line BP: (158)/(77) 158/77 mmHg (05/24 0900) Weight:  [118 lb 6.2 oz (53.7 kg)] 118 lb 6.2 oz (53.7 kg) (05/25 0400)  Hemodynamic parameters for last 24 hours:stable  leIntake/Output from previous day: 05/24 0701 - 05/25 0700 In: 1600 [P.O.:900; I.V.:446; IV Piggyback:254] Out: 1430 [Urine:1360; Chest Tube:70] Intake/Output this shift: Total I/O In: 22 [I.V.:20; IV Piggyback:2] Out: 40 [Urine:40]  EXAM L rales neuro w/o weakness  Lab Results:  Basename 10/07/11 0630 10/06/11 1700  WBC 10.2 8.5  HGB 7.9* 8.0*  HCT 23.0* 23.1*  PLT 129* 130*   BMET:  Basename 10/07/11 0450 10/06/11 1700 10/06/11 1657 10/06/11 0400  NA 136 -- 138 --  K 3.9 -- 3.6 --  CL 99 -- 98 --  CO2 23 -- -- 26  GLUCOSE 158* -- 287* --  BUN 16 -- 14 --  CREATININE 1.29* 1.11* -- --  CALCIUM 9.0 -- -- 8.9    PT/INR:  Basename 10/05/11 1930  LABPROT 17.8*  INR 1.44   ABG    Component Value Date/Time   PHART 7.411* 10/06/2011 0159   HCO3 27.9* 10/06/2011 0159   TCO2 25 10/06/2011 1657   ACIDBASEDEF 1.0 10/05/2011 2029   O2SAT 100.0 10/06/2011 0159   CBG (last 3)   Basename 10/07/11 0354 10/06/11 1938 10/06/11 1528  GLUCAP 140* 160* 280*    Assessment/Plan: S/P Procedure(s) (LRB): CORONARY ARTERY BYPASS GRAFTING (CABG) (N/A) Blood loss anemia- start iron Fluid overload- cont lasix iv bid   LOS: 3 days     VAN TRIGT III,Ab Leaming 10/07/2011

## 2011-10-08 ENCOUNTER — Inpatient Hospital Stay (HOSPITAL_COMMUNITY): Payer: Medicaid Other

## 2011-10-08 LAB — COMPREHENSIVE METABOLIC PANEL
ALT: 99 U/L — ABNORMAL HIGH (ref 0–35)
AST: 96 U/L — ABNORMAL HIGH (ref 0–37)
Albumin: 2.9 g/dL — ABNORMAL LOW (ref 3.5–5.2)
Alkaline Phosphatase: 85 U/L (ref 39–117)
BUN: 21 mg/dL (ref 6–23)
CO2: 26 mEq/L (ref 19–32)
Calcium: 8 mg/dL — ABNORMAL LOW (ref 8.4–10.5)
Chloride: 97 mEq/L (ref 96–112)
Creatinine, Ser: 1.46 mg/dL — ABNORMAL HIGH (ref 0.50–1.10)
GFR calc Af Amer: 42 mL/min — ABNORMAL LOW (ref >90)
GFR calc non Af Amer: 36 mL/min — ABNORMAL LOW (ref >90)
Glucose, Bld: 93 mg/dL (ref 70–99)
Potassium: 3 mEq/L — ABNORMAL LOW (ref 3.5–5.1)
Sodium: 131 mEq/L — ABNORMAL LOW (ref 135–145)
Total Bilirubin: 0.5 mg/dL (ref 0.3–1.2)
Total Protein: 5.4 g/dL — ABNORMAL LOW (ref 6.0–8.3)

## 2011-10-08 LAB — GLUCOSE, CAPILLARY
Glucose-Capillary: 116 mg/dL — ABNORMAL HIGH (ref 70–99)
Glucose-Capillary: 71 mg/dL (ref 70–99)
Glucose-Capillary: 78 mg/dL (ref 70–99)
Glucose-Capillary: 95 mg/dL (ref 70–99)

## 2011-10-08 LAB — PREPARE RBC (CROSSMATCH)

## 2011-10-08 MED ORDER — HALOPERIDOL LACTATE 5 MG/ML IJ SOLN
2.0000 mg | INTRAMUSCULAR | Status: DC | PRN
  Administered 2011-10-10 (×2): 2 mg via INTRAVENOUS
  Filled 2011-10-08 (×2): qty 1

## 2011-10-08 MED ORDER — LORAZEPAM 2 MG/ML IJ SOLN
0.5000 mg | INTRAMUSCULAR | Status: DC | PRN
  Administered 2011-10-10 (×3): 0.5 mg via INTRAVENOUS
  Filled 2011-10-08 (×3): qty 1

## 2011-10-08 MED ORDER — THIAMINE HCL 100 MG/ML IJ SOLN
100.0000 mg | Freq: Every day | INTRAMUSCULAR | Status: DC
  Administered 2011-10-08 – 2011-10-10 (×3): 100 mg via INTRAVENOUS
  Filled 2011-10-08 (×3): qty 1

## 2011-10-08 MED ORDER — HALOPERIDOL LACTATE 5 MG/ML IJ SOLN
INTRAMUSCULAR | Status: AC
  Administered 2011-10-08: 4 mg via INTRAVENOUS
  Filled 2011-10-08: qty 1

## 2011-10-08 MED ORDER — ALBUTEROL SULFATE (5 MG/ML) 0.5% IN NEBU
INHALATION_SOLUTION | RESPIRATORY_TRACT | Status: AC
  Administered 2011-10-08: 2.5 mg
  Filled 2011-10-08: qty 0.5

## 2011-10-08 MED ORDER — ALBUTEROL SULFATE (5 MG/ML) 0.5% IN NEBU
2.5000 mg | INHALATION_SOLUTION | RESPIRATORY_TRACT | Status: DC | PRN
  Administered 2011-10-09 – 2011-10-10 (×3): 2.5 mg via RESPIRATORY_TRACT
  Filled 2011-10-08 (×4): qty 0.5

## 2011-10-08 MED ORDER — POTASSIUM CHLORIDE 10 MEQ/50ML IV SOLN
10.0000 meq | INTRAVENOUS | Status: AC | PRN
  Administered 2011-10-08 – 2011-10-09 (×5): 10 meq via INTRAVENOUS

## 2011-10-08 MED ORDER — LORAZEPAM 2 MG/ML IJ SOLN
INTRAMUSCULAR | Status: AC
  Administered 2011-10-08: 1 mg via INTRAVENOUS
  Filled 2011-10-08: qty 1

## 2011-10-08 MED ORDER — LORAZEPAM 2 MG/ML IJ SOLN
1.0000 mg | Freq: Once | INTRAMUSCULAR | Status: AC
  Administered 2011-10-08: 1 mg via INTRAVENOUS

## 2011-10-08 MED ORDER — POTASSIUM CHLORIDE 10 MEQ/50ML IV SOLN
INTRAVENOUS | Status: AC
  Administered 2011-10-08: 10 meq via INTRAVENOUS
  Filled 2011-10-08: qty 150

## 2011-10-08 MED ORDER — INSULIN GLARGINE 100 UNIT/ML ~~LOC~~ SOLN
10.0000 [IU] | Freq: Two times a day (BID) | SUBCUTANEOUS | Status: DC
  Administered 2011-10-08 – 2011-10-09 (×3): 10 [IU] via SUBCUTANEOUS

## 2011-10-08 MED ORDER — HALOPERIDOL LACTATE 5 MG/ML IJ SOLN
4.0000 mg | Freq: Once | INTRAMUSCULAR | Status: AC
  Administered 2011-10-08: 4 mg via INTRAVENOUS
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
## 2011-10-08 NOTE — Progress Notes (Signed)
3 Days Post-Op Procedure(s) (LRB): CORONARY ARTERY BYPASS GRAFTING (CABG) (N/A) SubjectiveCABG for L main .90 Postop anemia, delerium, preop anemia  Objective: Vital signs in last 24 hours: Temp:  [97.4 F (36.3 C)-98.7 F (37.1 C)] 97.4 F (36.3 C) (05/26 0743) Pulse Rate:  [36-98] 69  (05/26 0800) Cardiac Rhythm:  [-] Normal sinus rhythm (05/26 0800) Resp:  [11-30] 12  (05/26 0800) BP: (79-139)/(42-82) 108/67 mmHg (05/26 0800) SpO2:  [60 %-100 %] 100 % (05/26 0800) Weight:  [124 lb 12.5 oz (56.6 kg)] 124 lb 12.5 oz (56.6 kg) (05/26 0600)  Hemodynamic parameters for last 24 hours:  stable  Intake/Output from previous day: 05/25 0701 - 05/26 0700 In: 148 [I.V.:40; IV Piggyback:108] Out: 765 [Urine:765] Intake/Output this shift: Total I/O In: 50 [IV Piggyback:50] Out: -   EXAM Sedated Lungs clear  Lab Results:  Basename 10/07/11 0630 10/06/11 1700  WBC 10.2 8.5  HGB 7.9* 8.0*  HCT 23.0* 23.1*  PLT 129* 130*   BMET:  Basename 10/08/11 0516 10/07/11 0450  NA 131* 136  K 3.0* 3.9  CL 97 99  CO2 26 23  GLUCOSE 93 158*  BUN 21 16  CREATININE 1.46* 1.29*  CALCIUM 8.0* 9.0    PT/INR:  Basename 10/05/11 1930  LABPROT 17.8*  INR 1.44   ABG    Component Value Date/Time   PHART 7.411* 10/06/2011 0159   HCO3 27.9* 10/06/2011 0159   TCO2 25 10/06/2011 1657   ACIDBASEDEF 1.0 10/05/2011 2029   O2SAT 100.0 10/06/2011 0159   CBG (last 3)   Basename 10/08/11 0729 10/08/11 0454 10/08/11 0001  GLUCAP 86 114* 92    Assessment/Plan: S/P Procedure(s) (LRB): CORONARY ARTERY BYPASS GRAFTING (CABG) (N/A) Cont PRN haldol i unit PRBC for anemia Decrease Lantus   LOS: 4 days    VAN TRIGT III,Jaice Lague 10/08/2011

## 2011-10-08 NOTE — Progress Notes (Signed)
Patient examined and record reviewed.Hemodynamics stable,labs satisfactory.Patient had stable day.Continue current care. Mental status better post haldol  VAN TRIGT III,Laina Guerrieri 10/08/2011

## 2011-10-09 ENCOUNTER — Inpatient Hospital Stay (HOSPITAL_COMMUNITY): Payer: Medicaid Other

## 2011-10-09 ENCOUNTER — Encounter (HOSPITAL_COMMUNITY): Payer: Self-pay

## 2011-10-09 LAB — TYPE AND SCREEN
ABO/RH(D): O POS
Antibody Screen: NEGATIVE
Unit division: 0
Unit division: 0
Unit division: 0
Unit division: 0
Unit division: 0

## 2011-10-09 LAB — CBC
HCT: 27 % — ABNORMAL LOW (ref 36.0–46.0)
Hemoglobin: 9.2 g/dL — ABNORMAL LOW (ref 12.0–15.0)
MCH: 27.6 pg (ref 26.0–34.0)
MCHC: 34.1 g/dL (ref 30.0–36.0)
MCV: 81.1 fL (ref 78.0–100.0)
Platelets: 132 10*3/uL — ABNORMAL LOW (ref 150–400)
RBC: 3.33 MIL/uL — ABNORMAL LOW (ref 3.87–5.11)
RDW: 15.2 % (ref 11.5–15.5)
WBC: 8.9 10*3/uL (ref 4.0–10.5)

## 2011-10-09 LAB — BASIC METABOLIC PANEL
BUN: 15 mg/dL (ref 6–23)
CO2: 26 mEq/L (ref 19–32)
Calcium: 9 mg/dL (ref 8.4–10.5)
Chloride: 107 mEq/L (ref 96–112)
Creatinine, Ser: 1.02 mg/dL (ref 0.50–1.10)
GFR calc Af Amer: 65 mL/min — ABNORMAL LOW (ref >90)
GFR calc non Af Amer: 56 mL/min — ABNORMAL LOW (ref >90)
Glucose, Bld: 119 mg/dL — ABNORMAL HIGH (ref 70–99)
Potassium: 3.7 mEq/L (ref 3.5–5.1)
Sodium: 142 mEq/L (ref 135–145)

## 2011-10-09 LAB — GLUCOSE, CAPILLARY
Glucose-Capillary: 114 mg/dL — ABNORMAL HIGH (ref 70–99)
Glucose-Capillary: 181 mg/dL — ABNORMAL HIGH (ref 70–99)
Glucose-Capillary: 241 mg/dL — ABNORMAL HIGH (ref 70–99)
Glucose-Capillary: 172 mg/dL — ABNORMAL HIGH (ref 70–99)
Glucose-Capillary: 92 mg/dL (ref 70–99)

## 2011-10-09 MED ORDER — POTASSIUM CHLORIDE 10 MEQ/50ML IV SOLN
10.0000 meq | INTRAVENOUS | Status: DC | PRN
  Administered 2011-10-09: 10 meq via INTRAVENOUS
  Filled 2011-10-09: qty 50
  Filled 2011-10-09: qty 150

## 2011-10-09 MED ORDER — FLUTICASONE-SALMETEROL 250-50 MCG/DOSE IN AEPB
1.0000 | INHALATION_SPRAY | Freq: Two times a day (BID) | RESPIRATORY_TRACT | Status: DC
  Administered 2011-10-09 – 2011-10-14 (×9): 1 via RESPIRATORY_TRACT
  Filled 2011-10-09: qty 14

## 2011-10-09 MED ORDER — LEVALBUTEROL HCL 0.63 MG/3ML IN NEBU
0.6300 mg | INHALATION_SOLUTION | Freq: Three times a day (TID) | RESPIRATORY_TRACT | Status: DC
  Administered 2011-10-09 – 2011-10-14 (×15): 0.63 mg via RESPIRATORY_TRACT
  Filled 2011-10-09 (×20): qty 3

## 2011-10-09 MED ORDER — POTASSIUM CHLORIDE 10 MEQ/50ML IV SOLN
INTRAVENOUS | Status: AC
  Administered 2011-10-09: 10 meq via INTRAVENOUS
  Filled 2011-10-09: qty 150

## 2011-10-09 MED FILL — Potassium Chloride Inj 2 mEq/ML: INTRAVENOUS | Qty: 40 | Status: AC

## 2011-10-09 MED FILL — Magnesium Sulfate Inj 50%: INTRAMUSCULAR | Qty: 10 | Status: AC

## 2011-10-09 NOTE — Progress Notes (Signed)
Physical Therapy Treatment Patient Details Name: Amanda [name] MRN: [id number] DOB: Nov 12, 1945 Today's Date: 10/09/2011 Time: 1436-1500 PT Time Calculation (min): 24 min  PT Assessment / Plan / Recommendation Comments on Treatment Session  managed walk very well, but with wheezing starting the last half of the walking distance.  Sats/ HR maintained well    Follow Up Recommendations  Home health PT;Supervision/Assistance - 24 hour    Barriers to Discharge        Equipment Recommendations  Other (comment)    Recommendations for Other Services    Frequency Min 3X/week   Plan Discharge plan remains appropriate    Precautions / Restrictions Precautions Precautions: Sternal Precaution Comments: Pt given pillow to hold with BUE to maintain precautions Restrictions Weight Bearing Restrictions: No   Pertinent Vitals/Pain     Mobility  Bed Mobility Bed Mobility: Not assessed Transfers Transfers: Sit to Stand;Stand to Sit Sit to Stand: 4: Min assist;Without upper extremity assist;From chair/3-in-1 Stand to Sit: 4: Min guard;Without upper extremity assist;To chair/3-in-1 Details for Transfer Assistance: interpreted cues/tc's for no use of UE; lifting assist Ambulation/Gait Ambulation/Gait Assistance: 4: Min guard Ambulation Distance (Feet): 350 Feet Assistive device: Rolling walker Ambulation/Gait Assistance Details: mild waddling gait, needed assist to guide the RW' Gait Pattern: Within Functional Limits Stairs: No Wheelchair Mobility Wheelchair Mobility: No    Exercises     PT Diagnosis:    PT Problem List:   PT Treatment Interventions:     PT Goals Acute Rehab PT Goals Time For Goal Achievement: 10/14/11 Potential to Achieve Goals: Good PT Goal: Sit to Stand - Progress: Progressing toward goal PT Goal: Ambulate - Progress: Progressing toward goal Additional Goals PT Goal: Additional Goal #1 - Progress: Progressing toward goal  Visit Information  Last PT  Received On: 10/09/11 Assistance Needed: +1    Subjective Data  Subjective: Thank you....   Cognition  Overall Cognitive Status: Appears within functional limits for tasks assessed/performed Arousal/Alertness: Awake/alert Behavior During Session: [facility] for tasks performed    Balance  Balance Balance Assessed: No  End of Session PT - End of Session Activity Tolerance: Patient tolerated treatment well Patient left: in chair;with call bell/phone within reach Nurse Communication: Mobility status;Other (comment)    Tremeka Helbling, Tessie Fass 10/09/2011, 3:09 PM  10/09/2011  Donnella Sham, [facility] [phone] (pager)

## 2011-10-09 NOTE — Progress Notes (Signed)
    Subjective:  Doesn't seem to have complaints but difficult to understand.  Objective:  Vital Signs in the last 24 hours: Temp:  [97.4 F (36.3 C)-99.2 F (37.3 C)] 98.3 F (36.8 C) (05/27 0734) Pulse Rate:  [65-124] 100  (05/27 0900) Resp:  [13-26] 25  (05/27 0900) BP: (83-154)/(56-120) 140/76 mmHg (05/27 0900) SpO2:  [87 %-100 %] 100 % (05/27 0900) Weight:  [53.8 kg (118 lb 9.7 oz)] 53.8 kg (118 lb 9.7 oz) (05/27 0521)  Intake/Output from previous day: 05/26 0701 - 05/27 0700 In: 1200 [P.O.:600; Blood:400; IV Piggyback:200] Out: 2580 [Urine:2580]  Physical Exam: Pt is alert and awake HEENT: normal Neck: JVP - normal Lungs: diffuse wheezing CV: RRR  Abd: soft, NT, Positive BS Ext: trace edema right leg Skin: warm/dry no rash  Lab Results:  Basename 10/09/11 0400 10/07/11 0630  WBC 8.9 10.2  HGB 9.2* 7.9*  PLT 132* 129*    Basename 10/09/11 0400 10/08/11 0516  NA 142 131*  K 3.7 3.0*  CL 107 97  CO2 26 26  GLUCOSE 119* 93  BUN 15 21  CREATININE 1.02 1.46*   No results found for this basename: TROPONINI:2,CK,MB:2 in the last 72 hours  Tele: sinus rhythm  Assessment/Plan:  1. Unstable angina with severe LM disease - s/p CABG 2. Type 2 diabetes 3. Hyperlipidemia  Patient is stable and progressing well following urgent CABG. Will follow from a distance and she is having no rhythm issues and seems to be progressing well after surgery. She is on aspirin and a statin drug. No beta blocker yet and she is actively wheezing.  Sherren Mocha, M.D. 10/09/2011, 9:22 AM

## 2011-10-09 NOTE — Progress Notes (Signed)
Patient ID: Amanda [name], female   DOB: 09-20-1945, 66 y.o.   MRN: [id number] TCTS DAILY PROGRESS NOTE                   [address]            [place] [address]          [phone]      4 Days Post-Op Procedure(s) (LRB): CORONARY ARTERY BYPASS GRAFTING (CABG) (N/A)  Total Length of Stay:  LOS: 5 days   Subjective: Difficulty to understanding but less confused. Audible wheezing Eating breakfast  Objective: Vital signs in last 24 hours: Temp:  [97.4 F (36.3 C)-99.2 F (37.3 C)] 98.3 F (36.8 C) (05/27 0734) Pulse Rate:  [65-124] 80  (05/27 0700) Cardiac Rhythm:  [-] Normal sinus rhythm (05/27 0600) Resp:  [12-26] 20  (05/27 0700) BP: (83-154)/(56-89) 154/80 mmHg (05/27 0700) SpO2:  [87 %-100 %] 100 % (05/27 0700) Weight:  [118 lb 9.7 oz (53.8 kg)] 118 lb 9.7 oz (53.8 kg) (05/27 0521)  Filed Weights   10/07/11 0400 10/08/11 0600 10/09/11 0521  Weight: 118 lb 6.2 oz (53.7 kg) 124 lb 12.5 oz (56.6 kg) 118 lb 9.7 oz (53.8 kg)    Weight change: -6 lb 2.8 oz (-2.8 kg)   Hemodynamic parameters for last 24 hours:    Intake/Output from previous day: 05/26 0701 - 05/27 0700 In: 1200 [P.O.:600; Blood:400; IV Piggyback:200] Out: 2580 [Urine:2580]  Intake/Output this shift: Total I/O In: 50 [IV Piggyback:50] Out: -   Current Meds: Scheduled Meds:   . acetaminophen  1,000 mg Oral Q6H   Or  . acetaminophen (TYLENOL) oral liquid 160 mg/5 mL  975 mg Per Tube Q6H  . albuterol      . aspirin EC  325 mg Oral Daily  . bisacodyl  10 mg Oral Daily   Or  . bisacodyl  10 mg Rectal Daily  . docusate sodium  200 mg Oral Daily  . insulin aspart  0-24 Units Subcutaneous Q4H  . insulin glargine  10 Units Subcutaneous BID  . iron polysaccharides  150 mg Oral Daily  . metoprolol tartrate  12.5 mg Oral BID   Or  . metoprolol tartrate  12.5 mg Per Tube BID  . pantoprazole  40 mg Oral Q1200  . simvastatin  40 mg Oral q1800  . sodium chloride  3 mL  Intravenous Q12H  . thiamine  100 mg Intravenous Daily  . DISCONTD: insulin glargine  18 Units Subcutaneous BID   Continuous Infusions:   . sodium chloride 20 mL (10/05/11 2231)   PRN Meds:.albuterol, albuterol, haloperidol lactate, LORazepam, metoprolol, ondansetron (ZOFRAN) IV, pneumococcal 23 valent vaccine, potassium chloride, potassium chloride, sodium chloride, traMADol  General appearance: alert, cooperative and hard to undersatnd Neurologic: intact Heart: regular rate and rhythm, S1, S2 normal, no murmur, click, rub or gallop and normal apical impulse Lungs: wheezes bilaterally Abdomen: soft, non-tender; bowel sounds normal; no masses,  no organomegaly Extremities: extremities normal, atraumatic, no cyanosis or edema and Homans sign is negative, no sign of DVT  Lab Results: CBC: Basename 10/09/11 0400 10/07/11 0630  WBC 8.9 10.2  HGB 9.2* 7.9*  HCT 27.0* 23.0*  PLT 132* 129*   BMET:  Basename 10/09/11 0400 10/08/11 0516  NA 142 131*  K 3.7 3.0*  CL 107 97  CO2 26 26  GLUCOSE 119* 93  BUN 15 21  CREATININE 1.02 1.46*  CALCIUM 9.0 8.0*    PT/INR:  No results found for this basename: LABPROT,INR in the last 72 hours Radiology: Dg Chest 2 View  10/09/2011  *RADIOLOGY REPORT*  Clinical Data: Shortness of breath.  Wheezing.  CABG on 10/05/2011. Effusions.  CHEST - 2 VIEW  Comparison: 05/26 and 10/04/2011  Findings: Central venous sheath has been removed.  No pneumothorax. Improved aeration in the right midzone and at the left base.  Small residual bilateral pleural effusions.  Slight atelectasis in the mid and lower lung zones.  IMPRESSION: Improving aeration of both lungs.  Small residual effusions.  Original Report Authenticated By: Larey Seat, M.D.   Dg Chest Port 1 View  10/08/2011  *RADIOLOGY REPORT*  Clinical Data: Post surgery  PORTABLE CHEST - 1 VIEW  Comparison: 10/07/2011  Findings: Cardiomegaly with mild interstitial edema.  Mild right perihilar opacity is  favored to reflect the centric edema or loculated fluid.  Small bilateral pleural effusions with associated atelectasis.  No pneumothorax.  Postsurgical changes related to prior CABG.  Stable right IJ venous sheath.  IMPRESSION: Cardiomegaly with mild interstitial edema and small bilateral pleural effusions.  Original Report Authenticated By: Julian Hy, M.D.     Assessment/Plan: S/P Procedure(s) (LRB): CORONARY ARTERY BYPASS GRAFTING (CABG) (N/A) Mobilize Diuresis acute blood loss anemia post op given blood yeaterday now HCT 23 to 27% Add albuterol      Grace Isaac MD  Beeper [phone] Office [phone] 10/09/2011 8:10 AM

## 2011-10-10 ENCOUNTER — Encounter (HOSPITAL_COMMUNITY): Payer: Self-pay | Admitting: Cardiothoracic Surgery

## 2011-10-10 ENCOUNTER — Inpatient Hospital Stay (HOSPITAL_COMMUNITY): Payer: Medicaid Other

## 2011-10-10 LAB — CBC
HCT: 26.9 % — ABNORMAL LOW (ref 36.0–46.0)
Hemoglobin: 8.9 g/dL — ABNORMAL LOW (ref 12.0–15.0)
MCH: 27.5 pg (ref 26.0–34.0)
MCHC: 33.1 g/dL (ref 30.0–36.0)
MCV: 83 fL (ref 78.0–100.0)
Platelets: 143 10*3/uL — ABNORMAL LOW (ref 150–400)
RBC: 3.24 MIL/uL — ABNORMAL LOW (ref 3.87–5.11)
RDW: 15.5 % (ref 11.5–15.5)
WBC: 9.8 10*3/uL (ref 4.0–10.5)

## 2011-10-10 LAB — BASIC METABOLIC PANEL
BUN: 12 mg/dL (ref 6–23)
CO2: 26 mEq/L (ref 19–32)
Calcium: 8.9 mg/dL (ref 8.4–10.5)
Chloride: 107 mEq/L (ref 96–112)
Creatinine, Ser: 0.94 mg/dL (ref 0.50–1.10)
GFR calc Af Amer: 72 mL/min — ABNORMAL LOW (ref >90)
GFR calc non Af Amer: 62 mL/min — ABNORMAL LOW (ref >90)
Glucose, Bld: 213 mg/dL — ABNORMAL HIGH (ref 70–99)
Potassium: 3.7 mEq/L (ref 3.5–5.1)
Sodium: 142 mEq/L (ref 135–145)

## 2011-10-10 LAB — GLUCOSE, CAPILLARY
Glucose-Capillary: 171 mg/dL — ABNORMAL HIGH (ref 70–99)
Glucose-Capillary: 116 mg/dL — ABNORMAL HIGH (ref 70–99)
Glucose-Capillary: 131 mg/dL — ABNORMAL HIGH (ref 70–99)

## 2011-10-10 MED ORDER — DILTIAZEM HCL ER 90 MG PO CP12
90.0000 mg | ORAL_CAPSULE | Freq: Two times a day (BID) | ORAL | Status: DC
  Administered 2011-10-10 – 2011-10-14 (×8): 90 mg via ORAL
  Filled 2011-10-10 (×9): qty 1

## 2011-10-10 MED ORDER — DEXTROSE 50 % IV SOLN
INTRAVENOUS | Status: AC
  Administered 2011-10-10: 50 mL
  Filled 2011-10-10: qty 50

## 2011-10-10 MED ORDER — METOPROLOL TARTRATE 25 MG PO TABS
25.0000 mg | ORAL_TABLET | Freq: Two times a day (BID) | ORAL | Status: DC
  Administered 2011-10-10: 25 mg via ORAL
  Filled 2011-10-10 (×2): qty 1

## 2011-10-10 MED ORDER — GLIPIZIDE 5 MG PO TABS
5.0000 mg | ORAL_TABLET | Freq: Every day | ORAL | Status: DC
  Administered 2011-10-10 – 2011-10-14 (×5): 5 mg via ORAL
  Filled 2011-10-10 (×9): qty 1

## 2011-10-10 MED ORDER — PRAVASTATIN SODIUM 40 MG PO TABS
80.0000 mg | ORAL_TABLET | Freq: Every day | ORAL | Status: DC
  Administered 2011-10-11 – 2011-10-13 (×3): 80 mg via ORAL
  Filled 2011-10-10 (×5): qty 2

## 2011-10-10 MED ORDER — SODIUM CHLORIDE 0.9 % IV SOLN
INTRAVENOUS | Status: DC
  Administered 2011-10-10: 20:00:00 via INTRAVENOUS

## 2011-10-10 MED ORDER — HALOPERIDOL LACTATE 5 MG/ML IJ SOLN
2.0000 mg | Freq: Once | INTRAMUSCULAR | Status: AC
  Administered 2011-10-10: 2 mg via INTRAVENOUS
  Filled 2011-10-10: qty 1

## 2011-10-10 MED ORDER — FUROSEMIDE 40 MG PO TABS
40.0000 mg | ORAL_TABLET | Freq: Every day | ORAL | Status: DC
  Administered 2011-10-10 – 2011-10-14 (×5): 40 mg via ORAL
  Filled 2011-10-10 (×6): qty 1

## 2011-10-10 MED ORDER — KCL IN DEXTROSE-NACL 20-5-0.45 MEQ/L-%-% IV SOLN
INTRAVENOUS | Status: DC
  Filled 2011-10-10: qty 1000

## 2011-10-10 MED ORDER — METFORMIN HCL 500 MG PO TABS
500.0000 mg | ORAL_TABLET | Freq: Two times a day (BID) | ORAL | Status: DC
  Administered 2011-10-10 – 2011-10-14 (×9): 500 mg via ORAL
  Filled 2011-10-10 (×14): qty 1

## 2011-10-10 MED ORDER — POTASSIUM CHLORIDE CRYS ER 20 MEQ PO TBCR: 40.0000 meq | EXTENDED_RELEASE_TABLET | Freq: Every day | ORAL | Status: DC

## 2011-10-10 MED ORDER — VITAMIN B-1 100 MG PO TABS
100.0000 mg | ORAL_TABLET | Freq: Every day | ORAL | Status: DC
  Administered 2011-10-11 – 2011-10-14 (×4): 100 mg via ORAL
  Filled 2011-10-10 (×4): qty 1

## 2011-10-10 MED ORDER — FUROSEMIDE 10 MG/ML IJ SOLN
40.0000 mg | Freq: Once | INTRAMUSCULAR | Status: AC
  Administered 2011-10-10: 40 mg via INTRAVENOUS
  Filled 2011-10-10: qty 4

## 2011-10-10 MED ORDER — METOPROLOL TARTRATE 25 MG/10 ML ORAL SUSPENSION
12.5000 mg | Freq: Two times a day (BID) | ORAL | Status: DC
  Filled 2011-10-10 (×2): qty 5

## 2011-10-10 MED ORDER — HALOPERIDOL LACTATE 5 MG/ML IJ SOLN
INTRAMUSCULAR | Status: AC
  Administered 2011-10-10: 2 mg via INTRAVENOUS
  Filled 2011-10-10: qty 1

## 2011-10-10 NOTE — Progress Notes (Signed)
Patient continues to have audible wheezing at this time.  PRN albuterol tx given to patient X 2, other brething tx medications given as well earlier in shift. MD Servando Snare notified at this time of patient current condition. New orders obtained at this time, will give 40mg  Lasix IV now. Patient NSR 90's, 99 % RA, SBP 130's-140's. Will continue to monitor the patient.

## 2011-10-10 NOTE — Progress Notes (Addendum)
[address]            [place] [address]          [phone]     5 Days Post-Op Procedure(s) (LRB): CORONARY ARTERY BYPASS GRAFTING (CABG) (N/A)  Subjective: Appears a bit agitated this am.    Objective: Vital signs in last 24 hours: Patient Vitals for the past 24 hrs:  BP Temp Temp src Pulse Resp SpO2  10/10/11 0700 153/89 mmHg - - 108  27  98 %  10/10/11 0600 124/72 mmHg - - 87  18  97 %  10/10/11 0500 147/70 mmHg - - 97  24  99 %  10/10/11 0440 - - - - - 97 %  10/10/11 0400 148/75 mmHg 98.4 F (36.9 C) Oral 81  21  99 %  10/10/11 0300 164/87 mmHg - - 92  22  99 %  10/10/11 0200 138/74 mmHg - - 81  20  99 %  10/10/11 0100 140/89 mmHg - - 86  24  98 %  10/10/11 0000 166/82 mmHg 98 F (36.7 C) Oral 111  27  99 %  10/09/11 2350 - - - - - 100 %  10/09/11 2300 154/96 mmHg - - 102  23  99 %  10/09/11 2200 - - - 102  27  100 %  10/09/11 2100 137/81 mmHg - - 99  20  99 %  10/09/11 2000 125/92 mmHg - - 97  25  99 %  10/09/11 1952 - 98.7 F (37.1 C) Oral - - -  10/09/11 1915 - - - - - 99 %  10/09/11 1900 155/74 mmHg - - 50  23  100 %  10/09/11 1800 133/98 mmHg - - 93  19  100 %  10/09/11 1700 124/67 mmHg - - 88  22  100 %  10/09/11 1600 130/74 mmHg - - 83  22  99 %  10/09/11 1514 - 98 F (36.7 C) Oral - - -  10/09/11 1500 162/97 mmHg - - 101  24  100 %  10/09/11 1450 - - - 106  - 96 %  10/09/11 1400 138/75 mmHg - - 93  22  98 %  10/09/11 1338 - - - - - 99 %  10/09/11 1300 127/81 mmHg - - 87  24  98 %  10/09/11 1200 141/71 mmHg - - 80  16  100 %  10/09/11 1155 - 98.4 F (36.9 C) Oral - - -  10/09/11 1100 106/57 mmHg - - 87  23  100 %  10/09/11 1000 131/60 mmHg - - - 29  98 %  10/09/11 0922 - - - - - 99 %  10/09/11 0900 140/76 mmHg - - 100  25  100 %  10/09/11 0800 134/120 mmHg - - 90  19  96 %   Current Weight  10/09/11 118 lb 9.7 oz (53.8 kg)  PRE-OPERATIVE WEIGHT: 49.3 kg    Intake/Output from previous day: 05/27 0701 -  05/28 0700 In: 580 [P.O.:530; IV Piggyback:50] Out: 950 [Urine:950]  CBGs [id number]  PHYSICAL EXAM:  Heart: RRR, tachy around 100 Lungs: diffuse wheezing bilaterally Wound: clean and dry Extremities: mild LE edema  Lab Results: CBC: Basename 10/10/11 0430 10/09/11 0400  WBC 9.8 8.9  HGB 8.9* 9.2*  HCT 26.9* 27.0*  PLT 143* 132*   BMET:  Basename 10/10/11  0430 10/09/11 0400  NA 142 142  K 3.7 3.7  CL 107 107  CO2 26 26  GLUCOSE 213* 119*  BUN 12 15  CREATININE 0.94 1.02  CALCIUM 8.9 9.0    PT/INR: No results found for this basename: LABPROT,INR in the last 72 hours   Assessment/Plan: S/P Procedure(s) (LRB): CORONARY ARTERY BYPASS GRAFTING (CABG) (N/A) CV- stable, sinus tach. BPs trending up. Will increase Lopressor. Vol overload- continue diuresis. Will start po Lasix. DM- sugars elevated.  Restart po meds. A1C=6.2 Pulm- continue pulm toilet, nebs. Expected post-op blood loss anemia- Continue Fe. Confusion- difficult to ascertain MS as pt speaks no [place].  She is slightly agitated this am.  Continue current care. ?tx PTCU soon.   LOS: 6 days    COLLINS,GINA H 10/10/2011   patient examined and medical record reviewed,agree with above note.  Will increase B blocker ,follow CBG d/t large fluctuations VAN TRIGT III,Haiden Clucas 10/10/2011

## 2011-10-10 NOTE — Progress Notes (Signed)
Physical Therapy Treatment Patient Details Name: Amanda [name] MRN: [id number] DOB: 1945/06/08 Today's Date: 10/10/2011 Time: [id number] PT Time Calculation (min): 17 min  PT Assessment / Plan / Recommendation Comments on Treatment Session  Patient s/p CABG with decr strength and incr pain limiting mobiltiy.  Has 24 hour care.      Follow Up Recommendations  Home health PT;Supervision/Assistance - 24 hour    Barriers to Discharge  None      Equipment Recommendations  Rolling walker with 5" wheels    Recommendations for Other Services  None  Frequency Min 3X/week   Plan Discharge plan remains appropriate;Frequency remains appropriate    Precautions / Restrictions Precautions Precautions: Sternal;Fall Restrictions Weight Bearing Restrictions: No   Pertinent Vitals/Pain VSS/No pain    Mobility  Bed Mobility Bed Mobility: Sit to Supine Sit to Supine: 4: Min assist Details for Bed Mobility Assistance: cues needed for sternal precautions with lying down.  Transfers Transfers: Sit to Stand;Stand to Sit Sit to Stand: 4: Min assist;Without upper extremity assist;From chair/3-in-1 Stand to Sit: 4: Min assist;Without upper extremity assist;To bed Details for Transfer Assistance: Pt educated to not use UEs but still needed constant cues not to use UEs for sternal precautions.   Ambulation/Gait Ambulation/Gait Assistance: 1: +2 Total assist Ambulation/Gait: Patient Percentage: 70% Ambulation Distance (Feet): 175 Feet Assistive device: 2 person hand held assist Ambulation/Gait Assistance Details: Waddling gait and continues to need asssit to guide RW, cues to stay on task as wel.   Gait Pattern: Within Functional Limits Gait velocity: slow cadence Stairs: No Wheelchair Mobility Wheelchair Mobility: No      PT Goals Acute Rehab PT Goals PT Goal: Rolling Supine to Left Side - Progress: Progressing toward goal PT Goal: Supine/Side to Sit - Progress: Progressing toward  goal PT Goal: Sit to Supine/Side - Progress: Progressing toward goal PT Goal: Sit to Stand - Progress: Progressing toward goal PT Goal: Ambulate - Progress: Progressing toward goal Additional Goals PT Goal: Additional Goal #1 - Progress: Progressing toward goal  Visit Information  Last PT Received On: 10/10/11 Assistance Needed: +2    Subjective Data  Subjective: Son present and interpreted during session.     Cognition  Overall Cognitive Status: Appears within functional limits for tasks assessed/performed Arousal/Alertness: Lethargic (difficult to keep patient awake) Behavior During Session: Lethargic    Balance  Balance Balance Assessed: No  End of Session PT - End of Session Equipment Utilized During Treatment: Gait belt Activity Tolerance: Patient limited by fatigue Patient left: in bed;with call bell/phone within reach;with family/visitor present Nurse Communication: Mobility status    INGOLD,Donyale Berthold 10/10/2011, 3:10 PM  [facility] Acute Rehabilitation [phone] (pager)

## 2011-10-10 NOTE — Progress Notes (Signed)
Patient ID: Amanda [name], female   DOB: [birth year], 66 y.o.   MRN: [id number]  Filed Vitals:   10/10/11 1551 10/10/11 1600 10/10/11 1700 10/10/11 1800  BP:  138/92 120/64 128/82  Pulse:  104 89 91  Temp: 98.3 F (36.8 C)     TempSrc: Oral     Resp:  22 18 15   Height:      Weight:      SpO2:  98% 98% 99%   Postop delirium.  Received Ativan and Haldol this afternoon for agitation and is now sleeping.  No urine since 7 am. Nurses will do bladder scan.  Renal function is normal but not taking po so she may need some IVF.

## 2011-10-10 NOTE — Progress Notes (Signed)
Patient Chest 2 view changed to portable, patient combative this am and refuses to leave room.

## 2011-10-10 NOTE — Progress Notes (Signed)
Results for SHAQUNDA, BEDOYA (MRN [id number]) as of 10/10/2011 10:46  Ref. Range 10/10/2011 04:01  Glucose-Capillary Latest Range: 70-99 mg/dL 28 (LL)    Hypoglycemic this morning (CBG 28 mg/dl).  Noted Lantus 10 units bid d/c'd as a result.  Agree.  Will follow. Wyn Quaker RN, MSN, CDE Diabetes Coordinator [facility]patient Diabetes [facility] [phone]

## 2011-10-10 NOTE — Progress Notes (Signed)
PT Cancellation Note  Treatment cancelled today due to medical issues with patient which prohibited therapy.  Patient agitated and unable to work with PT per nursing.  Will check back later this pm per nursing. HR up in 140s.   Thanks.  INGOLD,Sheritta Deeg 10/10/2011, 9:01 AM [facility] [phone] (pager)

## 2011-10-10 NOTE — Progress Notes (Signed)
Patient Name: Amanda [name] Date of Encounter: 10/10/2011     Principal Problem:  *Unstable angina Active Problems:  HYPERCHOLESTEROLEMIA  HYPERTENSION, BENIGN ESSENTIAL  GERD  Type 2 diabetes mellitus    SUBJECTIVE: Appears agitated. Delirious. Inattentive.    OBJECTIVE  Filed Vitals:   10/10/11 0500 10/10/11 0600 10/10/11 0700 10/10/11 0742  BP: 147/70 124/72 153/89   Pulse: 97 87 108   Temp:    97.3 F (36.3 C)  TempSrc:    Axillary  Resp: 24 18 27    Height:      Weight:   51 kg (112 lb 7 oz)   SpO2: 99% 97% 98%     Intake/Output Summary (Last 24 hours) at 10/10/11 0800 Last data filed at 10/09/11 1800  Gross per 24 hour  Intake    290 ml  Output    950 ml  Net   -660 ml   Weight change: -2.8 kg (-6 lb 2.8 oz)  PHYSICAL EXAM  General: Well developed, well nourished, in no acute distress. Head: Normocephalic, atraumatic, sclera non-icteric, no xanthomas, nares are without discharge.  Neck: Supple without bruits or JVD. Lungs:  Audible wheezing appreciated. No rales or rhonchi. Unlabored.  Heart: Tachycardic, regular, no s3, s4, or murmurs. Abdomen: Soft, non-tender, non-distended, BS + x 4.  Msk: Sternotomy incision site without obvious erythema or discharge. Strength and tone appears normal for age. Extremities: No clubbing, cyanosis or edema. DP/PT/Radials 2+ and equal bilaterally. Neuro: MS unable to be assessed due to language barrier. Moves all extremities spontaneously. Psych: Flat affect.  LABS:  Recent Labs  Basename 10/10/11 0430 10/09/11 0400   WBC 9.8 8.9   HGB 8.9* 9.2*   HCT 26.9* 27.0*   MCV 83.0 81.1   PLT 143* 132*   Lab 10/10/11 0430 10/09/11 0400 10/08/11 0516  NA 142 142 131*  K 3.7 3.7 3.0*  CL 107 107 97  CO2 26 26 26   BUN 12 15 21   CREATININE 0.94 1.02 1.46*  CALCIUM 8.9 9.0 8.0*  PROT -- -- 5.4*  BILITOT -- -- 0.5  ALKPHOS -- -- 85  ALT -- -- 99*  AST -- -- 96*  AMYLASE -- -- --  LIPASE -- -- --  GLUCOSE  213* 119* 93    TELE: sinus tachycardia, 100-130 overnight   Radiology/Studies:  Dg Chest 2 View  10/09/2011  *RADIOLOGY REPORT*  Clinical Data: Shortness of breath.  Wheezing.  CABG on 10/05/2011. Effusions.  CHEST - 2 VIEW  Comparison: 05/26 and 10/04/2011  Findings: Central venous sheath has been removed.  No pneumothorax. Improved aeration in the right midzone and at the left base.  Small residual bilateral pleural effusions.  Slight atelectasis in the mid and lower lung zones.  IMPRESSION: Improving aeration of both lungs.  Small residual effusions.  Original Report Authenticated By: Larey Seat, M.D.   Dg Chest 2 View  10/04/2011  *RADIOLOGY REPORT*  Clinical Data: Chest pain.  CHEST - 2 VIEW  Comparison: None.  Findings: Trachea is midline.  Heart is at the upper limits of normal in size.  Thoracic aorta is calcified.  Lungs are clear though mildly hyperinflated.  No pleural fluid. There may be an old left second rib fracture.  IMPRESSION: No acute findings.  Original Report Authenticated By: Luretha Rued, M.D.   Dg Chest Port 1 View  10/08/2011  *RADIOLOGY REPORT*  Clinical Data: Post surgery  PORTABLE CHEST - 1 VIEW  Comparison: 10/07/2011  Findings: Cardiomegaly  with mild interstitial edema.  Mild right perihilar opacity is favored to reflect the centric edema or loculated fluid.  Small bilateral pleural effusions with associated atelectasis.  No pneumothorax.  Postsurgical changes related to prior CABG.  Stable right IJ venous sheath.  IMPRESSION: Cardiomegaly with mild interstitial edema and small bilateral pleural effusions.  Original Report Authenticated By: Julian Hy, M.D.   Dg Chest Portable 1 View In Am  10/07/2011  *RADIOLOGY REPORT*  Clinical Data: Postop cardiac surgery.  PORTABLE CHEST - 1 VIEW  Comparison: 10/06/2011  Findings: Swan-Ganz catheter has been removed.  Right IJ sheath remains in place.  Left chest tube has been removed.  Mediastinal drains have  been removed.  There is no evidence for pneumothorax. The patient has had median sternotomy CABG.  Heart is enlarged. There is interstitial edema, increased since prior study.  Small bilateral pleural effusions are identified, right greater than left.  Bibasilar density obscures the hemidiaphragms bilaterally, increased since prior study especially on the right.  IMPRESSION:  1.  Interval removal of mediastinal drains, Swan-Ganz catheter, and left chest tube. 2.  Increased pulmonary edema and bibasilar opacities/effusions.  Original Report Authenticated By: Glenice Bow, M.D.   Dg Chest Portable 1 View In Am  10/06/2011  *RADIOLOGY REPORT*  Clinical Data: Open heart surgery  PORTABLE CHEST - 1 VIEW  Comparison: 10/05/2011  Findings: 0612 hours.  Left chest tube remains in place.  No evidence for pneumothorax.  Endotracheal and NG tubes have been removed in the interval.  Right IJ pulmonary catheter is in the interlobar pulmonary artery.  The midline mediastinal / pericardial drain persists. The cardiopericardial silhouette is enlarged.  Left base atelectasis has progressed in the interval.  IMPRESSION: Interval extubation and NG tube removal.  Slight increase in left base atelectasis.  Original Report Authenticated By: ERIC A. MANSELL, M.D.   Dg Chest Portable 1 View  10/05/2011  *RADIOLOGY REPORT*  Clinical Data: Postop film status post CABG.  PORTABLE CHEST - 1 VIEW  Comparison: Chest x-ray 10/04/2011.  Findings: An endotracheal tube is in place with tip 1.1 cm above the carina. A right internal jugular Cordis is present through which a Gordy Councilman catheter has been passed into the right main pulmonary artery distally.  A nasogastric tube is seen extending into the stomach cord is coiled upon itself.  There is a left chest tube in place with tip at the left apex, and side port projecting over the lateral aspect of the left hemithorax.  A mediastinal drain is also noted. Epicardial pacing wires are in  place.  Lung volumes are slightly low and there are bibasilar opacities favored to represent areas of postoperative atelectasis.  Mild pulmonary venous congestion is noted without frank pulmonary edema. An irregular opacity in the right infrahilar region, which could raise concern for mild aspiration.  No definite pleural effusions or pneumothorax.  Mild enlargement of the cardiopericardial silhouette is similar to priors.  Mild widening of the mediastinal contours is within normal limits in this postoperative patient. Atherosclerotic calcifications within the arch of the aorta. Status post median sternotomy for CABG.  IMPRESSION: 1.  Support apparatus, as above.  Please take note of the low position of the endotracheal tube and consider withdrawal approximately 3-4 cm for more optimal placement. 2. Bibasilar postoperative subsegmental atelectasis.  In addition, there is an irregular opacity in the right infrahilar region and that could indicate mild aspiration. 3.  Pulmonary venous congestion without frank pulmonary edema. 4.  Atherosclerosis.  These results were called by telephone on 10/05/2011  at  09:00 p.m. to  nurse Lauren in the surgical ICU, who verbally acknowledged these results.  Original Report Authenticated By: Etheleen Mayhew, M.D.    Current Medications:     . acetaminophen  1,000 mg Oral Q6H   Or  . acetaminophen (TYLENOL) oral liquid 160 mg/5 mL  975 mg Per Tube Q6H  . aspirin EC  325 mg Oral Daily  . bisacodyl  10 mg Oral Daily   Or  . bisacodyl  10 mg Rectal Daily  . dextrose      . docusate sodium  200 mg Oral Daily  . Fluticasone-Salmeterol  1 puff Inhalation BID  . furosemide  40 mg Intravenous Once  . furosemide  40 mg Oral Daily  . glipiZIDE  5 mg Oral QAC breakfast  . insulin aspart  0-24 Units Subcutaneous Q4H  . iron polysaccharides  150 mg Oral Daily  . levalbuterol  0.63 mg Nebulization TID  . metFORMIN  500 mg Oral BID WC  . metoprolol tartrate  25 mg Oral BID    Or  . metoprolol tartrate  12.5 mg Per Tube BID  . pantoprazole  40 mg Oral Q1200  . simvastatin  40 mg Oral q1800  . sodium chloride  3 mL Intravenous Q12H  . thiamine  100 mg Intravenous Daily  . DISCONTD: insulin glargine  10 Units Subcutaneous BID  . DISCONTD: metoprolol tartrate  12.5 mg Per Tube BID  . DISCONTD: metoprolol tartrate  12.5 mg Oral BID  . DISCONTD: potassium chloride  40 mEq Oral Daily    ASSESSMENT:  1. 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 Type 2 DM 3. Hyperlipidemia 4. Hypertension  DISCUSSION/PLAN:  Agitated this morning. Inattentive and appears delirious. Afebrile without a leukocytosis. Audible wheezing persists despite nebs. Lasix given this AM. I/O negative yesterday. CXR revealed improved pleural effusions and increased aeration yesterday. Lopressor up-titrated for rate-control (sinus tachycardia on telemetry, likely driven by agitation and albuterol treatments). Would consider switching to CCB for rate-control until wheezing resolves. Will discuss with MD. Continue ASA, statin and oral antihypoglycemics.    Signed, R. Valeria Batman, PA-C 10/10/2011, 8:00 AM Patient seen, examined. Available data reviewed. Agree with findings, assessment, and plan as outlined by Valeria Batman, PA-C. The patient was seen on the date of service. She has loud audible wheezing. Agree that her beta blocker should be discontinued and consider switch to a calcium channel blocker. She is receiving pulmonary care by the cardiac surgery service. Cardiac rhythm is stable.  Sherren Mocha, M.D. 10/11/2011 10:05 PM

## 2011-10-11 ENCOUNTER — Inpatient Hospital Stay (HOSPITAL_COMMUNITY): Payer: Medicaid Other

## 2011-10-11 LAB — CBC
HCT: 30.2 % — ABNORMAL LOW (ref 36.0–46.0)
Hemoglobin: 9.9 g/dL — ABNORMAL LOW (ref 12.0–15.0)
MCH: 27.3 pg (ref 26.0–34.0)
MCHC: 32.8 g/dL (ref 30.0–36.0)
MCV: 83.4 fL (ref 78.0–100.0)
Platelets: 168 10*3/uL (ref 150–400)
RBC: 3.62 MIL/uL — ABNORMAL LOW (ref 3.87–5.11)
RDW: 15.3 % (ref 11.5–15.5)
WBC: 8.1 10*3/uL (ref 4.0–10.5)

## 2011-10-11 LAB — COMPREHENSIVE METABOLIC PANEL
ALT: 48 U/L — ABNORMAL HIGH (ref 0–35)
AST: 27 U/L (ref 0–37)
Albumin: 3.1 g/dL — ABNORMAL LOW (ref 3.5–5.2)
Alkaline Phosphatase: 89 U/L (ref 39–117)
BUN: 14 mg/dL (ref 6–23)
CO2: 29 mEq/L (ref 19–32)
Calcium: 9.4 mg/dL (ref 8.4–10.5)
Chloride: 108 mEq/L (ref 96–112)
Creatinine, Ser: 0.95 mg/dL (ref 0.50–1.10)
GFR calc Af Amer: 71 mL/min — ABNORMAL LOW (ref >90)
GFR calc non Af Amer: 61 mL/min — ABNORMAL LOW (ref >90)
Glucose, Bld: 72 mg/dL (ref 70–99)
Potassium: 3.5 mEq/L (ref 3.5–5.1)
Sodium: 146 mEq/L — ABNORMAL HIGH (ref 135–145)
Total Bilirubin: 0.5 mg/dL (ref 0.3–1.2)
Total Protein: 6.8 g/dL (ref 6.0–8.3)

## 2011-10-11 LAB — GLUCOSE, CAPILLARY
Glucose-Capillary: 111 mg/dL — ABNORMAL HIGH (ref 70–99)
Glucose-Capillary: 79 mg/dL (ref 70–99)
Glucose-Capillary: 73 mg/dL (ref 70–99)
Glucose-Capillary: 228 mg/dL — ABNORMAL HIGH (ref 70–99)
Glucose-Capillary: 119 mg/dL — ABNORMAL HIGH (ref 70–99)

## 2011-10-11 MED ORDER — POTASSIUM CHLORIDE 10 MEQ/50ML IV SOLN
10.0000 meq | INTRAVENOUS | Status: AC | PRN
  Administered 2011-10-11 (×3): 10 meq via INTRAVENOUS

## 2011-10-11 MED ORDER — MOVING RIGHT ALONG BOOK
Freq: Once | Status: AC
  Administered 2011-10-11: 16:00:00
  Filled 2011-10-11: qty 1

## 2011-10-11 MED ORDER — HALOPERIDOL LACTATE 5 MG/ML IJ SOLN
2.0000 mg | Freq: Once | INTRAMUSCULAR | Status: DC | PRN
  Filled 2011-10-11: qty 0.4

## 2011-10-11 MED ORDER — SODIUM CHLORIDE 0.9 % IJ SOLN: 3.0000 mL | Freq: Two times a day (BID) | INTRAMUSCULAR | Status: DC

## 2011-10-11 MED ORDER — INSULIN ASPART 100 UNIT/ML ~~LOC~~ SOLN
0.0000 [IU] | Freq: Three times a day (TID) | SUBCUTANEOUS | Status: DC
  Administered 2011-10-12 (×2): 2 [IU] via SUBCUTANEOUS
  Administered 2011-10-12: 12 [IU] via SUBCUTANEOUS
  Administered 2011-10-13 – 2011-10-14 (×3): 2 [IU] via SUBCUTANEOUS

## 2011-10-11 MED ORDER — SODIUM CHLORIDE 0.9 % IV SOLN: 250.0000 mL | INTRAVENOUS | Status: DC | PRN

## 2011-10-11 MED ORDER — SODIUM CHLORIDE 0.9 % IJ SOLN: 3.0000 mL | INTRAMUSCULAR | Status: DC | PRN

## 2011-10-11 NOTE — Progress Notes (Signed)
Pt transported by wheelchair to X-ray on room air.  Tolerated transport and X-ray well.  Returned to room 2304 and assisted back to chair.  Resting comfortably, vitals stable.  Will continue to monitor.

## 2011-10-11 NOTE — Plan of Care (Signed)
Problem: Phase III Progression Outcomes Goal: Time patient transferred to PCTU/Telemetry POD Outcome: Completed/Met Date Met:  10/11/11 1530

## 2011-10-11 NOTE — Progress Notes (Signed)
Pacing wires removed at 0930 per order. Pt lied flat in bed for removal. No bleeding or tissue noted. Pt tolerated well. Vital signs monitored per protocol. Will continue to monitor.

## 2011-10-11 NOTE — Progress Notes (Signed)
No urine since 09:00.  Pt reported to have been up to [facility] twice to urinate; no success. MD aware; orders received. Bladder scan showed 700 ml urine in bladder.  Pt assisted onto bedpan; 625 clear yellow urine out.  Will continue to monitor.

## 2011-10-12 ENCOUNTER — Inpatient Hospital Stay (HOSPITAL_COMMUNITY): Payer: Medicaid Other

## 2011-10-12 LAB — CBC
HCT: 29 % — ABNORMAL LOW (ref 36.0–46.0)
Hemoglobin: 9.5 g/dL — ABNORMAL LOW (ref 12.0–15.0)
MCH: 27.5 pg (ref 26.0–34.0)
MCHC: 32.8 g/dL (ref 30.0–36.0)
MCV: 84.1 fL (ref 78.0–100.0)
Platelets: 173 10*3/uL (ref 150–400)
RBC: 3.45 MIL/uL — ABNORMAL LOW (ref 3.87–5.11)
RDW: 15.2 % (ref 11.5–15.5)
WBC: 8 10*3/uL (ref 4.0–10.5)

## 2011-10-12 LAB — BASIC METABOLIC PANEL
BUN: 13 mg/dL (ref 6–23)
CO2: 30 mEq/L (ref 19–32)
Calcium: 9.3 mg/dL (ref 8.4–10.5)
Chloride: 100 mEq/L (ref 96–112)
Creatinine, Ser: 0.99 mg/dL (ref 0.50–1.10)
GFR calc Af Amer: 67 mL/min — ABNORMAL LOW (ref >90)
GFR calc non Af Amer: 58 mL/min — ABNORMAL LOW (ref >90)
Glucose, Bld: 135 mg/dL — ABNORMAL HIGH (ref 70–99)
Potassium: 4.1 mEq/L (ref 3.5–5.1)
Sodium: 141 mEq/L (ref 135–145)

## 2011-10-12 LAB — GLUCOSE, CAPILLARY
Glucose-Capillary: 140 mg/dL — ABNORMAL HIGH (ref 70–99)
Glucose-Capillary: 114 mg/dL — ABNORMAL HIGH (ref 70–99)

## 2011-10-12 MED ORDER — SODIUM CHLORIDE 0.9 % IJ SOLN: 10.0000 mL | Freq: Two times a day (BID) | INTRAMUSCULAR | Status: DC

## 2011-10-12 MED ORDER — SODIUM CHLORIDE 0.9 % IJ SOLN
10.0000 mL | INTRAMUSCULAR | Status: DC | PRN
  Administered 2011-10-12: 20 mL
  Administered 2011-10-13 (×4): 10 mL
  Administered 2011-10-14: 20 mL

## 2011-10-12 MED FILL — Lidocaine HCl IV Inj 20 MG/ML: INTRAVENOUS | Qty: 5 | Status: AC

## 2011-10-12 MED FILL — Sodium Chloride IV Soln 0.9%: INTRAVENOUS | Qty: 1000 | Status: AC

## 2011-10-12 MED FILL — Mannitol IV Soln 20%: INTRAVENOUS | Qty: 500 | Status: AC

## 2011-10-12 MED FILL — Calcium Chloride Inj 10%: INTRAVENOUS | Qty: 10 | Status: AC

## 2011-10-12 MED FILL — Heparin Sodium (Porcine) Inj 1000 Unit/ML: INTRAMUSCULAR | Qty: 30 | Status: AC

## 2011-10-12 MED FILL — Albumin, Human Inj 5%: INTRAVENOUS | Qty: 250 | Status: AC

## 2011-10-12 MED FILL — Electrolyte-R (PH 7.4) Solution: INTRAVENOUS | Qty: 4000 | Status: AC

## 2011-10-12 MED FILL — Heparin Sodium (Porcine) Inj 1000 Unit/ML: INTRAMUSCULAR | Qty: 10 | Status: AC

## 2011-10-12 MED FILL — Sodium Bicarbonate IV Soln 8.4%: INTRAVENOUS | Qty: 50 | Status: AC

## 2011-10-12 MED FILL — Sodium Chloride Irrigation Soln 0.9%: Qty: 3000 | Status: AC

## 2011-10-12 NOTE — Progress Notes (Signed)
Physical Therapy Treatment Patient Details Name: Amanda [name] MRN: [id number] DOB: October 25, 1945 Today's Date: 10/12/2011 Time: [id number] PT Time Calculation (min): 27 min  PT Assessment / Plan / Recommendation Comments on Treatment Session  Pt s/p CABG with greatly improving mobility. Used interpreter line to initiate tx, discuss sternal precautions and tell pt of plan of tx. Visual and tactile cues used rest of session due to unable to bring phone out of room. No family present. Pt assisted with preparing lunch end of session and  stated no pain. Will follow but anticipate all goals will be met soon.     Follow Up Recommendations  Home health PT;Supervision - Intermittent    Barriers to Discharge        Equipment Recommendations  None recommended by PT    Recommendations for Other Services    Frequency     Plan Discharge plan needs to be updated;Frequency remains appropriate    Precautions / Restrictions Precautions Precautions: Sternal   Pertinent Vitals/Pain No pain reported or signs of pain HR 90-100 throughout Unable to get pulse ox to read on 2L before and after ambulation    Mobility  Bed Mobility Bed Mobility: Not assessed Transfers Transfers: Sit to Stand;Stand to Sit Sit to Stand: 5: Supervision;From chair/3-in-1;From toilet Stand to Sit: 5: Supervision;To toilet;To chair/3-in-1 Details for Transfer Assistance: visual and tactile cueing for hand placement, Pt able to perform pericare without assist Ambulation/Gait Ambulation/Gait Assistance: 6: Modified independent (Device/Increase time) Ambulation Distance (Feet): 150 Feet Assistive device: None Ambulation/Gait Assistance Details: decreased speed, no AD and no LOB Gait Pattern: Within Functional Limits Stairs: No    Exercises General Exercises - Lower Extremity Long Arc Quad: AROM;Both;10 reps;Seated Hip Flexion/Marching: AROM;Both;20 reps;Seated   PT Diagnosis:    PT Problem List:   PT Treatment  Interventions:     PT Goals Acute Rehab PT Goals Pt will go Sit to Stand: Independently PT Goal: Sit to Stand - Progress: Updated due to goal met Pt will Ambulate: >150 feet;Independently PT Goal: Ambulate - Progress: Updated due to goal met Additional Goals PT Goal: Additional Goal #1 - Progress: Progressing toward goal  Visit Information  Last PT Received On: 10/12/11 Assistance Needed: +1    Subjective Data  Subjective: I'm tired   Cognition  Overall Cognitive Status: Appears within functional limits for tasks assessed/performed Arousal/Alertness: Awake/alert Cognition - Other Comments: unable to fully assess due to language barrier    Balance     End of Session PT - End of Session Activity Tolerance: Patient tolerated treatment well Patient left: in chair;with call bell/phone within reach Nurse Communication: Mobility status    Melford Aase 10/12/2011, 1:57 PM Elwyn Reach, [place]

## 2011-10-12 NOTE — Care Management Note (Unsigned)
    Page 1 of 1   10/12/2011     2:12:42 PM   CARE MANAGEMENT NOTE 10/12/2011  Patient:  Amanda [name], Amanda [name]   Account Number:  [id number]  Date Initiated:  10/05/2011  Documentation initiated by:  Elissa Hefty  Subjective/Objective Assessment:   adm w unstable angina     Action/Plan:   lives w fam, pcp Dagoberto Reef at [place]   Anticipated DC Date:  10/08/2011   Anticipated DC Plan:  Reform  CM consult      Choice offered to / List presented to:             Status of service:  In process, will continue to follow Medicare Important Message given?   (If response is "NO", the following Medicare IM given date fields will be blank) Date Medicare IM given:   Date Additional Medicare IM given:    Discharge Disposition:    Per UR Regulation:  Reviewed for med. necessity/level of care/duration of stay  If discussed at [place] of Stay Meetings, dates discussed:    Comments:  10/11/11 Amanda Sporn,RN,BSN 1200 MET WITH PT Amanda [name].  PTA, PT LIVES WITH SON AND DTR-IN-LAW, AND IS INDEPENDENT.   SHE IS FROM [place], AND SPEAKS NO ENGLISH.  HER SON HAS BEEN INTERPRETING FOR HER.  PT S/P CABG X 3 ON 10/05/11.  SON AND DTR-IN LAW TO PROVIDE 24HR CARE AT DISCHARGE.  WILL CONT TO FOLLOW FOR HOME NEEDS AS PT PROGRESSES TOWARDS DC.  5/23 9:43a debbie dowell rn,bsn [id number]

## 2011-10-12 NOTE — Progress Notes (Signed)
CARDIAC REHAB PHASE I   PRE:    MODE:  Ambulation: 350 ft   POST:  Rate/Rhythm: 106 ST    BP: sitting 122/50     SaO2: 94 RA  Pt up with nursing with RW. Finished walking with pt 350 ft with RW. Steady. Seemingly no c/o. Did not wear O2 and SaO2 good after walk. Pt had slight wheezing. Was able to cough up sputum while walking x1. Return to chair.  [place], [place], ACSM

## 2011-10-12 NOTE — Progress Notes (Addendum)
[address]Suite [address]            [place] [address]          [phone]     7 Days Post-Op  Procedure(s) (LRB): CORONARY ARTERY BYPASS GRAFTING (CABG) (N/A) Subjective: Nobody in room currently to translate  Objective  Telemetry sinus rhythm   Temp:  [97.6 F (36.4 C)-98.5 F (36.9 C)] 98.4 F (36.9 C) (05/30 0421) Pulse Rate:  [83-103] 103  (05/30 0421) Resp:  [16-45] 16  (05/30 0421) BP: (103-144)/(61-93) 118/74 mmHg (05/30 0421) SpO2:  [94 %-100 %] 98 % (05/30 0421) FiO2 (%):  [0 %] 0 % (05/29 1524) Weight:  [104 lb 14.4 oz (47.582 kg)] 104 lb 14.4 oz (47.582 kg) (05/30 0418)   Intake/Output Summary (Last 24 hours) at 10/12/11 0835 Last data filed at 10/11/11 1704  Gross per 24 hour  Intake 416.67 ml  Output    750 ml  Net -333.33 ml       General appearance: alert, cooperative and no distress Heart: regular rate and rhythm and S1, S2 normal Lungs: diminished in bases Abdomen: soft, nontender Extremities: min edema Wound: incisions healing well  Lab Results:  Basename 10/12/11 0710 10/11/11 0500  NA 141 146*  K 4.1 3.5  CL 100 108  CO2 30 29  GLUCOSE 135* 72  BUN 13 14  CREATININE 0.99 0.95  CALCIUM 9.3 9.4  MG -- --  PHOS -- --    Basename 10/11/11 0500  AST 27  ALT 48*  ALKPHOS 89  BILITOT 0.5  PROT 6.8  ALBUMIN 3.1*   No results found for this basename: LIPASE:2,AMYLASE:2 in the last 72 hours  Basename 10/12/11 0710 10/11/11 0500  WBC 8.0 8.1  NEUTROABS -- --  HGB 9.5* 9.9*  HCT 29.0* 30.2*  MCV 84.1 83.4  PLT 173 168   No results found for this basename: CKTOTAL:4,CKMB:4,TROPONINI:4 in the last 72 hours No components found with this basename: POCBNP:3 No results found for this basename: DDIMER in the last 72 hours No results found for this basename: HGBA1C in the last 72 hours No results found for this basename: CHOL,HDL,LDLCALC,TRIG,CHOLHDL in the last 72 hours No results found for this basename:  TSH,T4TOTAL,FREET3,T3FREE,THYROIDAB in the last 72 hours No results found for this basename: VITAMINB12,FOLATE,FERRITIN,TIBC,IRON,RETICCTPCT in the last 72 hours  Medications: Scheduled    . aspirin EC  325 mg Oral Daily  . bisacodyl  10 mg Oral Daily  . diltiazem  90 mg Oral Q12H  . docusate sodium  200 mg Oral Daily  . Fluticasone-Salmeterol  1 puff Inhalation BID  . furosemide  40 mg Oral Daily  . glipiZIDE  5 mg Oral QAC breakfast  . insulin aspart  0-24 Units Subcutaneous TID AC & HS  . iron polysaccharides  150 mg Oral Daily  . levalbuterol  0.63 mg Nebulization TID  . metFORMIN  500 mg Oral BID WC  . moving right along book   Does not apply Once  . pantoprazole  40 mg Oral Q1200  . pravastatin  80 mg Oral q1800  . sodium chloride  3 mL Intravenous Q12H  . thiamine  100 mg Oral Daily  . DISCONTD: acetaminophen (TYLENOL) oral liquid 160 mg/5 mL  975 mg Per Tube Q6H  . DISCONTD: bisacodyl  10 mg Rectal Daily  . DISCONTD: insulin aspart  0-24 Units Subcutaneous Q4H  .  DISCONTD: sodium chloride  3 mL Intravenous Q12H     Radiology/Studies:  Dg Chest 2 View  10/12/2011  *RADIOLOGY REPORT*  Clinical Data: Post CABG  CHEST - 2 VIEW  Comparison: 10/11/2011  Findings: A peripheral catheter is identified with the tip located in the right axillary region and this is unchanged in position.  The patient is status post median sternotomy and CABG.  Heart size is upper normal and stable.  A stable mediastinal contour is seen. Persistent bilateral pleural effusions are noted but have decreased slightly in size since the previous exam.  Some associated bibasilar atelectasis is noted left greater than right and this is unchanged in comparison with the prior exam.  Some central pulmonary vascular congestion with peribronchial cuffing and vascular indistinctness is seen.  No interstitial septal lines or alveolar edema is noted and no new focal infiltrates are seen.  Bony structures appear intact.   IMPRESSION: Decreasing size of bilateral pleural effusions.  Persistent pulmonary vascular congestion with resolving interstitial edema and unchanged bibasilar volume loss.  Peripheral catheter with tip in the right axillary region is unchanged.  Original Report Authenticated By: Ander Gaster, M.D.   Dg Chest 2 View  10/11/2011  *RADIOLOGY REPORT*  Clinical Data: CABG.  Soreness  CHEST - 2 VIEW  Comparison: 10/10/2011  Findings: There is a peripheral catheter identified within the right upper extremity.  This may represent a midline venous catheter.  Status post median sternotomy and CABG procedure.  The heart size appears normal.  There has been interval decrease in pleural effusions and interstitial edema.  Persistent atelectasis within both lung bases.  IMPRESSION:  1.  Interval improvement in pleural effusions.  Original Report Authenticated By: Angelita Ingles, M.D.     INR: Will add last result for INR, ABG once components are confirmed Will add last 4 CBG results once components are confirmed  Assessment/Plan: S/P Procedure(s) (LRB): CORONARY ARTERY BYPASS GRAFTING (CABG) (N/A)  1. Appears to be stable and progressing nicely 2. Anemia is stable 3 cont diuresis/pulm toilet/rehab 4 cbg adeq controlled 29-140 range  LOS: 8 days    GOLD,WAYNE E 5/30/20138:35 AM    Confusion,wheezing are better Incisions healing NSR, CXR clear Plan home Sat w/ HHN

## 2011-10-13 DIAGNOSIS — Z951 Presence of aortocoronary bypass graft: Secondary | ICD-10-CM

## 2011-10-13 LAB — GLUCOSE, CAPILLARY
Glucose-Capillary: 117 mg/dL — ABNORMAL HIGH (ref 70–99)
Glucose-Capillary: 105 mg/dL — ABNORMAL HIGH (ref 70–99)

## 2011-10-13 MED ORDER — ASPIRIN 325 MG PO TBEC: 325.0000 mg | DELAYED_RELEASE_TABLET | Freq: Every day | ORAL | Status: AC

## 2011-10-13 MED ORDER — DILTIAZEM HCL ER 90 MG PO CP12: 90.0000 mg | ORAL_CAPSULE | Freq: Two times a day (BID) | ORAL | Status: DC

## 2011-10-13 MED ORDER — POLYSACCHARIDE IRON COMPLEX 150 MG PO CAPS: 150.0000 mg | ORAL_CAPSULE | Freq: Every day | ORAL | Status: DC

## 2011-10-13 MED ORDER — FLUTICASONE-SALMETEROL 250-50 MCG/DOSE IN AEPB: 1.0000 | INHALATION_SPRAY | Freq: Two times a day (BID) | RESPIRATORY_TRACT | Status: DC

## 2011-10-13 MED ORDER — TRAMADOL HCL 50 MG PO TABS: 50.0000 mg | ORAL_TABLET | Freq: Four times a day (QID) | ORAL | Status: AC | PRN

## 2011-10-13 MED ORDER — FUROSEMIDE 40 MG PO TABS: 40.0000 mg | ORAL_TABLET | Freq: Every day | ORAL | Status: DC

## 2011-10-13 NOTE — Progress Notes (Signed)
Physical Therapy Treatment Patient Details Name: Amanda [name] MRN: [id number] DOB: August 11, 1945 Today's Date: 10/13/2011 Time: [id number] PT Time Calculation (min): 22 min  PT Assessment / Plan / Recommendation Comments on Treatment Session  Pt s/p CABG and son present to interpret today. Pt able to perform all mobility mod I-Supervision level without difficulty and son throughly educated for precautions with mobility. Pt in room with cardiac rehab end of session.     Follow Up Recommendations  No PT follow up    Barriers to Discharge        Equipment Recommendations  None recommended by PT    Recommendations for Other Services    Frequency     Plan Discharge plan needs to be updated    Precautions / Restrictions Precautions Precautions: Sternal Precaution Comments: Pt and son educated for all sternal precautions   Pertinent Vitals/Pain No pain HR 102-110 throughout RA    Mobility  Bed Mobility Bed Mobility: Rolling Left;Left Sidelying to Sit;Sitting - Scoot to Edge of Bed;Sit to Sidelying Left Rolling Left: 6: Modified independent (Device/Increase time) Left Sidelying to Sit: 6: Modified independent (Device/Increase time);HOB flat Sitting - Scoot to Edge of Bed: 6: Modified independent (Device/Increase time) Sit to Sidelying Left: 6: Modified independent (Device/Increase time) Transfers Transfers: Sit to Stand;Stand to Sit Sit to Stand: 5: Supervision Stand to Sit: 5: Supervision Details for Transfer Assistance: cueing for hand placement only Ambulation/Gait Ambulation/Gait Assistance: 6: Modified independent (Device/Increase time) Ambulation Distance (Feet): 400 Feet Assistive device: None Ambulation/Gait Assistance Details: WFL, decreased speed only Gait Pattern: Within Functional Limits Stairs: No    Exercises     PT Diagnosis:    PT Problem List:   PT Treatment Interventions:     PT Goals Acute Rehab PT Goals PT Goal: Rolling Supine to Left Side -  Progress: Met PT Goal: Supine/Side to Sit - Progress: Met PT Goal: Sit to Supine/Side - Progress: Met PT Goal: Sit to Stand - Progress: Progressing toward goal PT Goal: Ambulate - Progress: Progressing toward goal Additional Goals PT Goal: Additional Goal #1 - Progress: Partly met  Visit Information  Last PT Received On: 10/13/11 Assistance Needed: +1    Subjective Data  Subjective: she says thank you   Cognition  Overall Cognitive Status: Appears within functional limits for tasks assessed/performed Arousal/Alertness: Awake/alert Orientation Level: Appears intact for tasks assessed Behavior During Session: [facility] [place] for tasks performed    Balance     End of Session PT - End of Session Activity Tolerance: Patient tolerated treatment well Patient left: in chair;with call bell/phone within reach;with family/visitor present Nurse Communication: Mobility status    Melford Aase 10/13/2011, 12:46 PM Elwyn Reach, [place]

## 2011-10-13 NOTE — Progress Notes (Addendum)
8 Days Post-Op Procedure(s) (LRB): CORONARY ARTERY BYPASS GRAFTING (CABG) (N/A)  Subjective: No translator in room this morning  Objective: Vital signs in last 24 hours: Temp:  [97.8 F (36.6 C)-98.7 F (37.1 C)] 98.7 F (37.1 C) (05/31 0310) Pulse Rate:  [90-97] 95  (05/31 0310) Cardiac Rhythm:  [-] Normal sinus rhythm;Bundle branch block (05/30 2100) Resp:  [16-18] 18  (05/31 0310) BP: (106-111)/(67-73) 111/73 mmHg (05/31 0310) SpO2:  [95 %-100 %] 97 % (05/31 0310) FiO2 (%):  [28 %] 28 % (05/30 0914) Weight:  [109 lb 12.6 oz (49.8 kg)-109 lb 14.4 oz (49.85 kg)] 109 lb 12.6 oz (49.8 kg) (05/31 0436)  Intake/Output from previous day: 05/30 0701 - 05/31 0700 In: 600 [P.O.:600] Out: -   General appearance: alert and no distress Heart: regular rate and rhythm Lungs: clear to auscultation bilaterally Abdomen: soft, non-tender; bowel sounds normal; no masses,  no organomegaly Extremities: edema trace Wound: clean and dry  Lab Results:  Basename 10/12/11 0710 10/11/11 0500  WBC 8.0 8.1  HGB 9.5* 9.9*  HCT 29.0* 30.2*  PLT 173 168   BMET:  Basename 10/12/11 0710 10/11/11 0500  NA 141 146*  K 4.1 3.5  CL 100 108  CO2 30 29  GLUCOSE 135* 72  BUN 13 14  CREATININE 0.99 0.95  CALCIUM 9.3 9.4    PT/INR: No results found for this basename: LABPROT,INR in the last 72 hours ABG    Component Value Date/Time   PHART 7.411* 10/06/2011 0159   HCO3 27.9* 10/06/2011 0159   TCO2 25 10/06/2011 1657   ACIDBASEDEF 1.0 10/05/2011 2029   O2SAT 100.0 10/06/2011 0159   CBG (last 3)   Basename 10/12/11 2022 10/12/11 1630 10/12/11 1113  GLUCAP 289* 114* 138*    Assessment/Plan: S/P Procedure(s) (LRB): CORONARY ARTERY BYPASS GRAFTING (CABG) (N/A)  2. CV- NSR rate and pressure well controlled 3. Resp- wean off oxygen as tolerated 4. CBGs- high this morning, but have been well controlled 5. Fluid Balance- weight is stable, minimal LE edema, will plan for short course of Lasix at  d/c 6. Dispo- patient doing better, will wean off oxygen today, plan for d/c home tomorrow with home health   LOS: 9 days    BARRETT, ERIN 10/13/2011   patient examined and medical record reviewed,agree with above note. VAN TRIGT III,Ashlan Dignan 10/13/2011

## 2011-10-13 NOTE — Discharge Instructions (Signed)
Coronary Artery Bypass Grafting Care After Refer to this sheet in the next few weeks. These instructions provide you with information on caring for yourself after your procedure. Your caregiver may also give you more specific instructions. Your treatment has been planned according to current medical practices, but problems sometimes occur. Call your caregiver if you have any problems or questions after your procedure.  Recovery from open heart surgery will be different for everyone. Some people feel well after 3 or 4 weeks, while for others it takes longer. After heart surgery, it may be normal to:  Not have an appetite, feel nauseated by the smell of food, or only want to eat a small amount.   Be constipated because of changes in your diet, activity, and medicines. Eat foods high in fiber. Add fresh fruits and vegetables to your diet. Stool softeners may be helpful.   Feel sad or unhappy. You may be frustrated or cranky. You may have good days and bad days. Do not give up. Talk to your caregiver if you do not feel better.   Feel weakness and fatigue. You many need physical therapy or cardiac rehabilitation to get your strength back.   Develop an irregular heartbeat called atrial fibrillation. Symptoms of atrial fibrillation are a fast, irregular heartbeat or feelings of fluttery heartbeats, shortness of breath, low blood pressure, and dizziness. If these symptoms develop, see your caregiver right away.  MEDICATION  Have a list of all the medicines you will be taking when you leave the hospital. For every medicine, know the following:   Name.   Exact dose.   Time of day to be taken.   How often it should be taken.   Why you are taking it.   Ask which medicines should or should not be taken together. If you take more than one heart medicine, ask if it is okay to take them together. Some heart medicines should not be taken at the same time because they may lower your blood pressure too  much.   Narcotic pain medicine can cause constipation. Eat fresh fruits and vegetables. Add fiber to your diet. Stool softener medicine may help relieve constipation.   Keep a copy of your medicines with you at all times.   Do not add or stop taking any medicine until you check with your caregiver.   Medicines can have side effects. Call your caregiver who prescribed the medicine if you:   Start throwing up, have diarrhea, or have stomach pain.   Feel dizzy or lightheaded when you stand up.   Feel your heart is skipping beats or is beating too fast or too slow.   Develop a rash.   Notice unusual bruising or bleeding.  HOME CARE INSTRUCTIONS  After heart surgery, it is important to learn how to take your pulse. Have your caregiver show you how to take your pulse.   Use your incentive spirometer. Ask your caregiver how long after surgery you need to use it.  Care of your chest incision  Tell your caregiver right away if you notice clicking in your chest (sternum).   Support your chest with a pillow or your arms when you take deep breaths and cough.   Follow your caregiver's instructions about when you can bathe or swim.   Protect your incision from sunlight during the first year to keep the scar from getting dark.   Tell your caregiver if you notice:   Increased tenderness of your incision.   Increased redness  or swelling around your incision.   Drainage or pus from your incision.  Care of your leg incision(s)  Avoid crossing your legs.   Avoid sitting for long periods of time. Change positions every half hour.   Elevate your leg(s) when you are sitting.   Check your leg(s) daily for swelling. Check the incisions for redness or drainage.   Wear your elastic stockings as told by your caregiver. Take them off at bedtime.  Diet  Diet is very important to heart health.   Eat plenty of fresh fruits and vegetables. Meats should be lean cut. Avoid canned, processed, and  fried foods.   Talk to a dietician. They can teach you how to make healthy food and drink choices.  Weight  Weigh yourself every day. This is important because it helps to know if you are retaining fluid that may make your heart and lungs work harder.   Use the same scale each time.   Weigh yourself every morning at the same time. You should do this after you go to the bathroom, but before you eat breakfast.   Your weight will be more accurate if you do not wear any clothes.   Record your weight.   Tell your caregiver if you have gained 2 pounds or more overnight.  Activity Stop any activity at once if you have chest pain, shortness of breath, irregular heartbeats, or dizziness. Get help right away if you have any of these symptoms.  Bathing.  Avoid soaking in a bath or hot tub until your incisions are healed.   Rest. You need a balance of rest and activity.   Exercise. Exercise per your caregiver's advice. You may need physical therapy or cardiac rehabilitation to help strengthen your muscles and build your endurance.   Climbing stairs. Unless your caregiver tells you not to climb stairs, go up stairs slowly and rest if you tire. Do not pull yourself up by the handrail.   Driving a car. Follow your caregiver's advice on when you may drive. You may ride as a passenger at any time. When traveling for long periods of time in a car, get out of the car and walk around for a few minutes every 2 hours.   Lifting. Avoid lifting, pushing, or pulling anything heavier than 10 pounds for 6 weeks after surgery or as told by your caregiver.   Returning to work. Check with your caregiver. People heal at different rates. Most people will be able to go back to work 6 to 12 weeks after surgery.   Sexual activity. You may resume sexual relations as told by your caregiver.  SEEK MEDICAL CARE IF:  Any of your incisions are red, painful, or have any type of drainage coming from them.   You have an  oral temperature above 102 F (38.9 C).   You have ankle or leg swelling.   You have pain in your legs.   You have weight gain of 2 or more pounds a day.   You feel dizzy or lightheaded when you stand up.  SEEK IMMEDIATE MEDICAL CARE IF:  You have angina or chest pain that goes to your jaw or arms. Call your local emergency services right away.   You have shortness of breath at rest or with activity.   You have a fast or irregular heartbeat (arrhythmia).   There is a "clicking" in your sternum when you move.   You have numbness or weakness in your arms or legs.  MAKE SURE YOU:  Understand these instructions.   Will watch your condition.   Will get help right away if you are not doing well or get worse.  Document Released: 11/18/2004 Document Revised: 04/20/2011 Document Reviewed: 07/06/2010 [facility] Patient Information 2012 [place].  Endoscopic Saphenous Vein Harvesting Care After Refer to this sheet in the next few weeks. These instructions provide you with information on caring for yourself after your procedure. Your caregiver may also give you more specific instructions. Your treatment has been planned according to current medical practices, but problems sometimes occur. Call your caregiver if you have any problems or questions after your procedure. HOME CARE INSTRUCTIONS Medicine  Take whatever pain medicine your surgeon prescribes. Follow the directions carefully. Do not take over-the-counter pain medicine unless your surgeon says it is okay. Some pain medicine can cause bleeding problems for several weeks after surgery.   Follow your surgeon's instructions about driving. You will probably not be permitted to drive after heart surgery.   Take any medicines your surgeon prescribes. Any medicines you took before your heart surgery should be checked with your caregiver before you start taking them again.  Wound care  Ask your surgeon how long you should keep  wearing your elastic bandage or stocking.   Check the area around your surgical cuts (incisions) whenever your bandages (dressings) are changed. Look for any redness or swelling.   You will need to return to have the stitches (sutures) or staples taken out. Ask your surgeon when to do that.   Ask your surgeon when you can shower or bathe.  Activity  Try to keep your legs raised when you are sitting.   Do any exercises your caregivers have given you. These may include deep breathing exercises, coughing, walking, or other exercises.  SEEK MEDICAL CARE IF:  You have any questions about your medicines.   You have more leg pain, especially if your pain medicine stops working.   New or growing bruises develop on your leg.   Your leg swells, feels tight, or becomes red.   You have numbness in your leg.  SEEK IMMEDIATE MEDICAL CARE IF:  Your pain gets much worse.   Blood or fluid leaks from any of the incisions.   Your incisions become warm, swollen, or red.   You have chest pain.   You have trouble breathing.   You have a fever.   You have more pain near your leg incision.  MAKE SURE YOU:  Understand these instructions.   Will watch your condition.   Will get help right away if you are not doing well or get worse.  Document Released: 01/11/2011 Document Revised: 04/20/2011 Document Reviewed: 01/11/2011 [facility] Patient Information 2012 [place].

## 2011-10-13 NOTE — Progress Notes (Signed)
1220-1300 Pt's son translated information for mother. Education completed. Put IS in room and had pt demonstrate use along with flutter valve. Declined CRP2. Will walk for ex with son. CharleneDunlapRN

## 2011-10-13 NOTE — Discharge Summary (Signed)
Physician Discharge Summary  Patient ID: Amanda [name] MRN: [id number] DOB/AGE: [birth year] 67 y.o.  Admit date: 10/04/2011 Discharge date: 10/13/2011  Admission Diagnoses:  Patient Active Problem List  Diagnoses  . HELICOBACTER PYLORI INFECTION  . DIABETIC FOOT ULCER  . HYPERCHOLESTEROLEMIA  . HYPERTENSION, BENIGN ESSENTIAL  . GERD  . CONSTIPATION  . PRURITUS  . DRY SKIN  . DEGENERATIVE DISC DISEASE, LUMBAR SPINE  . MURMUR  . URINARY HESITANCY  . Chest pain  . Unstable angina  . Type 2 diabetes mellitus   Discharge Diagnoses:   Patient Active Problem List  Diagnoses  . HELICOBACTER PYLORI INFECTION  . DIABETIC FOOT ULCER  . HYPERCHOLESTEROLEMIA  . HYPERTENSION, BENIGN ESSENTIAL  . GERD  . CONSTIPATION  . PRURITUS  . DRY SKIN  . DEGENERATIVE DISC DISEASE, LUMBAR SPINE  . MURMUR  . URINARY HESITANCY  . Chest pain  . Unstable angina  . Type 2 diabetes mellitus  . S/P CABG x 3   Discharged Condition: good  Hospital Course:   Amanda [name] is a 66 yo African American female who speaks very little [place].  She was originally evaluated by Dr. Cathie Olden on 09/25/2011.  At which time the patient was complaining of episodic chest pain which mainly occurs with exertion.  She also describes pain as reflux type symptoms.  Baseline EKG obtained revealed non-specific ST and T wave changes.  It was felt that CAD should be ruled out.  She was set up for a Myoview study to further evaluation.  This was performed on 10/04/2011 which was abnormal with evidence on an inferior lateral and apical MI with peri-infarct ischemia in both regions.  After the study the patient was severely short of breath and just felt poorly.  She was sent to the ED for admission with Cardiology service.  She presented to the Emergency Department with her son.  Per translation by her son the patient developed chest pain and shortness of breath which has been occurring for the past year.  Upon presentation the  patient denied feeling any different than she has previously.  The patient stated her pain is located mid sternum with radiation into her back.  She rate the pain as being an 8/10.  Upon arrival she was evaluated by Dr. Peter Martinique with the cardiology service who felt that if the patient's lab work was stable she would be taken to the catheterization lab for further workup.  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  Due to the findings it was felt the patient would benefit from emergent coronary bypass, however the patient's son did not feel she would agree to this.  TCTS was consulted for evaluation.  Dr. Prescott Gum evaluated the patient and agreed she would be best treatment with coronary bypass procedure.  He discussed at great length the risks and benefits of the procedure as well as the increased risk of sudden cardiac death without proceeding with intervention.  The patient and her son agreed to consider the possibility of surgery.  HD #1 the patient remained chest pain free of IV heparin.  The patient and her son agreed that surgery was her best treatment option and they were willing to proceed. She was taken emergently to the OR that afternoon and underwent CABG x3 utilizing LIMA to LAD, SVG to Diagonal, and SVG to LCx.  She also underwent Endoscopic Saphenous Vein Harvest of the Right Leg.  She tolerated the procedure well and was taken to the  SICU in stable condition.  POD #1 the patient was extubated.  POD #2 the patient was started on iron for acute post operative anemia.  The patients chest tubes were removed.  POD #3 the patient was transfused 1U of PRBCs for anemia.  She developed delirium overnight requiring use of Haldol.  POD #4 patient developed wheezing.  She was placed on albuterol treatments.  POD #5 patient was tachycardic and hypertensive.  Her beta blocker was increased.  POD #6 Cardiology evaluated patient.  Felt tachycardia was most likely due to  albuterol and continued agitation.  They recommended discontinuation of beta blocker until wheezing resolved.  She was started on Cardizem for rate control.  POD #7 the patients wheezing and confusion has improved.  She is maintaining NSR and her chest xray is free of pneumothorax and significant pleural effusions.  POD #8 the patient continues to improve.  Should no further issues present she will likely be ready for d/c home in the next 24-48 hours.  She will need to follow up with Dr. Prescott Gum on 11/08/11 at 1:30pm.  She will need to have a chest xray performed one hour prior to her appointment at [facility] located on the first floor of [facility].  She will need to schedule a 2 week follow up appointment with Dr. Acie Fredrickson  Discharge Instructions:  No heaving lifting for 6 weeks (<8lbs) No driving for 4 weeks No push, pulling with arms for 6 weeks No strenuous activity for 4 weeks   Disposition: Final discharge disposition not confirmed  Discharge Orders    Future Appointments: Provider: Department: Dept Phone: Center:   01/01/2012 9:15 AM Thayer Headings, MD [facility] [phone] None     Future Orders Please Complete By Expires   Diet Carb Modified      Discharge activity:      Comments:   No lifting greater than 8lbs for 6 weeks (about a gallon of milk)     Medication List  As of 10/13/2011  9:52 AM   STOP taking these medications         aspirin 81 MG tablet      ibuprofen 600 MG tablet      olmesartan 40 MG tablet         TAKE these medications         aspirin 325 MG EC tablet   Take 1 tablet (325 mg total) by mouth daily.      diltiazem 90 MG 12 hr capsule   Commonly known as: CARDIZEM SR   Take 1 capsule (90 mg total) by mouth every 12 (twelve) hours.      esomeprazole 40 MG capsule   Commonly known as: NEXIUM   Take 40 mg by mouth daily before breakfast.      Fluticasone-Salmeterol 250-50 MCG/DOSE Aepb   Commonly known as: ADVAIR     Inhale 1 puff into the lungs 2 (two) times daily.      furosemide 40 MG tablet   Commonly known as: LASIX   Take 1 tablet (40 mg total) by mouth daily. For 3 days      glipiZIDE 5 MG tablet   Commonly known as: GLUCOTROL   Take 5 mg by mouth daily.      iron polysaccharides 150 MG capsule   Commonly known as: NIFEREX   Take 1 capsule (150 mg total) by mouth daily.      metFORMIN 500 MG tablet   Commonly known as:  GLUCOPHAGE   Take 500 mg by mouth 2 (two) times daily with a meal.      pravastatin 80 MG tablet   Commonly known as: PRAVACHOL   Take 80 mg by mouth daily.      traMADol 50 MG tablet   Commonly known as: ULTRAM   Take 1 tablet (50 mg total) by mouth every 6 (six) hours as needed.             SignedEllwood Handler 10/13/2011, 9:52 AM

## 2011-10-14 LAB — GLUCOSE, CAPILLARY: Glucose-Capillary: 145 mg/dL — ABNORMAL HIGH (ref 70–99)

## 2011-10-14 MED ORDER — LEVALBUTEROL HCL 0.63 MG/3ML IN NEBU
0.6300 mg | INHALATION_SOLUTION | Freq: Four times a day (QID) | RESPIRATORY_TRACT | Status: DC | PRN
  Filled 2011-10-14: qty 3

## 2011-10-14 NOTE — Progress Notes (Signed)
Removed CT sutures per MD order per hospital policy. Applied benzoin and steri strips to the site. No drainage. Patient tolerated well. Glade Nurse, RN

## 2011-10-14 NOTE — Progress Notes (Signed)
Patient ambulated in the hallway 400 feet. Gait steady, stopped a couple times to catch her breath. No RW used to walk. VSS. Glade Nurse, RN

## 2011-10-14 NOTE — Progress Notes (Addendum)
                    [address]            [place] [address]          [phone]     9 Days Post-Op Procedure(s) (LRB): CORONARY ARTERY BYPASS GRAFTING (CABG) (N/A)  Subjective: Walked in hall with RN without difficulty.  Objective: Vital signs in last 24 hours: Patient Vitals for the past 24 hrs:  BP Temp Temp src Pulse Resp SpO2 Weight  10/14/11 0802 - - - - - 96 % -  10/14/11 0424 107/69 mmHg 98.7 F (37.1 C) Oral 93  18  94 % 110 lb 7.2 oz (50.1 kg)  10/13/11 2208 114/72 mmHg - - - - - -  10/13/11 2120 - - - - - 96 % -  10/13/11 1951 98/61 mmHg 99 F (37.2 C) Oral 95  18  96 % -  10/13/11 1414 - - - - - 94 % -  10/13/11 1347 107/63 mmHg 98.8 F (37.1 C) Oral 92  18  93 % -  10/13/11 1239 - - - 102  - - -   Current Weight  10/14/11 110 lb 7.2 oz (50.1 kg)     Intake/Output from previous day: 05/31 0701 - 06/01 0700 In: 720 [P.O.:720] Out: 1900 [Urine:1900]    PHYSICAL EXAM:  Heart: RRR Lungs: clear Wound:clean and dry Extremities: no significant edema  Lab Results: CBC: Basename 10/12/11 0710  WBC 8.0  HGB 9.5*  HCT 29.0*  PLT 173   BMET:  Basename 10/12/11 0710  NA 141  K 4.1  CL 100  CO2 30  GLUCOSE 135*  BUN 13  CREATININE 0.99  CALCIUM 9.3    PT/INR: No results found for this basename: LABPROT,INR in the last 72 hours   Assessment/Plan: S/P Procedure(s) (LRB): CORONARY ARTERY BYPASS GRAFTING (CABG) (N/A) Stable, doing well. Home today once her son arrives to review d/c instructions.   LOS: 10 days    COLLINS,GINA H 10/14/2011    Chart reviewed, patient examined, agree with above.

## 2011-10-14 NOTE — Progress Notes (Signed)
   CARE MANAGEMENT NOTE 10/14/2011  Patient:  Amanda [name], Amanda [name]   Account Number:  [id number]  Date Initiated:  10/05/2011  Documentation initiated by:  Elissa Hefty  Subjective/Objective Assessment:   adm w unstable angina     Action/Plan:   lives w fam, pcp Dagoberto Reef at [facility]   Anticipated DC Date:  10/08/2011   Anticipated DC Plan:  [place]  CM consult      Choice offered to / List presented to:             Status of service:  Completed, signed off Medicare Important Message given?   (If response is "NO", the following Medicare IM given date fields will be blank) Date Medicare IM given:   Date Additional Medicare IM given:    Discharge Disposition:  HOME/SELF CARE  Per UR Regulation:  Reviewed for med. necessity/level of care/duration of stay  If discussed at [place] of Stay Meetings, dates discussed:    Comments:  10/14/2011 1300 Contacted main pharmacy and pt is eligible for ZZ fund. Made pt (son translated for pt) aware that fund can be used once per year. Jonnie Finner RN CCM Case Mgmt phone [phone]  10/11/11 JULIE AMERSON,RN,BSN 1200 MET WITH PT AND FAMILY TO DISCUSS DC PLANNING.  PTA, PT LIVES WITH SON AND DTR-IN-LAW, AND IS INDEPENDENT.   SHE IS FROM [place], AND SPEAKS NO ENGLISH.  HER SON HAS BEEN INTERPRETING FOR HER.  PT S/P CABG X 3 ON 10/05/11.  SON AND DTR-IN LAW TO PROVIDE 24HR CARE AT DISCHARGE.  WILL CONT TO FOLLOW FOR HOME NEEDS AS PT PROGRESSES TOWARDS DC.  5/23 9:43a debbie dowell rn,bsn [phone]

## 2011-10-16 NOTE — Discharge Summary (Signed)
patient examined and medical record reviewed,agree with above note. VAN TRIGT III,Rexton Greulich 10/16/2011

## 2011-10-31 ENCOUNTER — Other Ambulatory Visit: Payer: Self-pay | Admitting: Cardiothoracic Surgery

## 2011-10-31 DIAGNOSIS — I2510 Atherosclerotic heart disease of native coronary artery without angina pectoris: Secondary | ICD-10-CM

## 2011-11-06 ENCOUNTER — Encounter: Payer: Self-pay | Admitting: Cardiology

## 2011-11-08 ENCOUNTER — Ambulatory Visit (INDEPENDENT_AMBULATORY_CARE_PROVIDER_SITE_OTHER): Payer: Self-pay | Admitting: Cardiothoracic Surgery

## 2011-11-08 ENCOUNTER — Encounter: Payer: Self-pay | Admitting: Cardiothoracic Surgery

## 2011-11-08 ENCOUNTER — Ambulatory Visit
Admission: RE | Admit: 2011-11-08 | Discharge: 2011-11-08 | Disposition: A | Discharge status: Home / Self Care | Payer: Medicaid Other | Source: Ambulatory Visit | Attending: Cardiothoracic Surgery | Admitting: Cardiothoracic Surgery

## 2011-11-08 VITALS — BP 128/79 | HR 92 | Resp 16 | Ht <= 58 in | Wt 109.5 lb

## 2011-11-08 DIAGNOSIS — I2510 Atherosclerotic heart disease of native coronary artery without angina pectoris: Secondary | ICD-10-CM

## 2011-11-08 DIAGNOSIS — Z951 Presence of aortocoronary bypass graft: Secondary | ICD-10-CM

## 2011-11-08 DIAGNOSIS — Z09 Encounter for follow-up examination after completed treatment for conditions other than malignant neoplasm: Secondary | ICD-10-CM

## 2011-11-08 NOTE — Progress Notes (Signed)
PCP is Aurora Mask, NP Referring Provider is Nahser, Amanda Cheng, MD  Chief Complaint  Patient presents with  . Routine Post Op    s/p CABG 10/05/11 with CXR                     [address]            [place] [address]          [phone]     HPI: The patient is a 66 year old Amanda [name] female is admitted with unstable angina and severe left main 3 vessel CAD and underwent urgent CABG x3. Postoperatively she developed delirium which ultimately cleared. She was discharged home in sinus rhythm and has been recovering with her son. She's had no recurrent delirium, no recurrent angina, and the surgical incisions are healing well. He is followed a heart healthy diet and has been taking her discharge medications as directed.   Past Medical History  Diagnosis Date  . Hypertension   . Hyperlipidemia   . Diabetes mellitus   . Abdominal pain   . Chest pain     Atypical  . GERD (gastroesophageal reflux disease)     Past Surgical History  Procedure Date  . Coronary artery bypass graft 10/04/2011  . Coronary artery bypass graft 10/05/2011    Procedure: CORONARY ARTERY BYPASS GRAFTING (CABG);  Surgeon: Ivin Poot, MD;  Location: [facility];  Service: Open Heart Surgery;  Laterality: N/A;  TEE    No family history on file.  Social History History  Substance Use Topics  . Smoking status: Never Smoker   . Smokeless tobacco: Not on file  . Alcohol Use: No    Current Outpatient Prescriptions  Medication Sig Dispense Refill  . diltiazem (CARDIZEM SR) 90 MG 12 hr capsule Take 1 capsule (90 mg total) by mouth every 12 (twelve) hours.  60 capsule  1  . esomeprazole (NEXIUM) 40 MG capsule Take 40 mg by mouth daily before breakfast.      . furosemide (LASIX) 40 MG tablet Take 1 tablet (40 mg total) by mouth daily. For 3 days  3 tablet  0  . glipiZIDE (GLUCOTROL) 5 MG tablet Take 5 mg by mouth daily.      . iron polysaccharides (NIFEREX) 150 MG capsule Take 1 capsule (150 mg  total) by mouth daily.  30 capsule  0  . metFORMIN (GLUCOPHAGE) 500 MG tablet Take 500 mg by mouth 2 (two) times daily with a meal.      . pravastatin (PRAVACHOL) 80 MG tablet Take 80 mg by mouth daily.      . traMADol (ULTRAM) 50 MG tablet Take 50 mg by mouth every 6 (six) hours as needed.      . Fluticasone-Salmeterol (ADVAIR) 250-50 MCG/DOSE AEPB Inhale 1 puff into the lungs 2 (two) times daily.  60 each  1    No Known Allergies  Review of Systems no fever improving appetite no ankle swelling patient tolerating walking much better after bypass surgery without tightness or shortness of breath  BP 128/79  Pulse 92  Resp 16  Ht 4\' 7"  (1.397 m)  Wt 109 lb 8 oz (49.669 kg)  BMI 25.45 kg/m2  SpO2 95% Physical Exam Alert responsive interpretation provided by son present during exam Lungs clear with slight diminished breath sounds at the right base Cardiac rhythm regular without murmur or gallop sternal incision well-healed Extremities right leg incision healed  Diagnostic Tests: Chest x-ray shows new appearance  of a moderate right pleural effusion no left pleural effusion stable cardiac silhouette sternal wires intact Impression: Excellent initial recovery after multivessel bypass surgery for left main stenosis. Moderate right pleural effusion of recent onset  Plan: Short course of Lasix and Zaroxolyn for right pleural effusion with followup office visit and chest x-ray. Sternal precautions again reviewed with patient and son. Return to office with chest x-ray in 2-3 weeks

## 2011-11-09 ENCOUNTER — Ambulatory Visit (INDEPENDENT_AMBULATORY_CARE_PROVIDER_SITE_OTHER): Payer: Medicaid Other | Admitting: Cardiovascular Disease

## 2011-11-09 ENCOUNTER — Encounter: Payer: Self-pay | Admitting: Cardiovascular Disease

## 2011-11-09 VITALS — BP 112/68 | HR 80 | Ht <= 58 in | Wt 103.2 lb

## 2011-11-09 DIAGNOSIS — D649 Anemia, unspecified: Secondary | ICD-10-CM

## 2011-11-09 LAB — CBC WITH DIFFERENTIAL/PLATELET
Basophils Relative: 0.4 % (ref 0.0–3.0)
Hemoglobin: 9.1 g/dL — ABNORMAL LOW (ref 12.0–15.0)
Lymphocytes Relative: 22.8 % (ref 12.0–46.0)
Monocytes Relative: 9.4 % (ref 3.0–12.0)
Neutro Abs: 5.1 10*3/uL (ref 1.4–7.7)
Neutrophils Relative %: 64.7 % (ref 43.0–77.0)
RBC: 3.38 Mil/uL — ABNORMAL LOW (ref 3.87–5.11)
WBC: 7.9 10*3/uL (ref 4.5–10.5)

## 2011-11-09 MED ORDER — METOPROLOL TARTRATE 25 MG PO TABS: 25.0000 mg | ORAL_TABLET | Freq: Two times a day (BID) | ORAL | Status: DC

## 2011-11-09 NOTE — Assessment & Plan Note (Signed)
Amanda [name] is doing well following her coronary artery bypass grafting. She had some tachycardia and was started on Cardizem. They were avoiding metoprolol because she was wheezing. She's not wheezing now.  She's had some problems with anemia and has been on iron. Her biggest complaint today is that of constipation.  We'll stop the Cardizem and start her on metoprolol 25 mg twice a day. We'll also check a CBC. We may be able to stop the iron supplements which will help her constipation as well. I'll see her again in 3 months for office visit, fasting labs, and EKG.

## 2011-11-09 NOTE — Patient Instructions (Addendum)
Your physician recommends that you schedule a follow-up appointment in: 3 months   Your physician recommends that you return for lab work in: today/ cbc  Your physician has recommended you make the following change in your medication: called to [facility]  Stop diltiazem due to constipation Start metoprolol 25 mg one tablet twice daily

## 2011-11-09 NOTE — Progress Notes (Signed)
Amanda [name] Date of Birth  02-15-1946       [facility] Office [address], [address], [place], [place]  [address]   [place], [place]  [address] [phone]     [phone]   Fax  [phone]    Fax [phone]  Problem List: 1. Chest pain- mildly abnormal EKG 2. Diabetes mellitus type 2 3. Hypertension 4. Hyperlipidemia  History of Present Illness:  Amanda [name] is a 20 she will female from [place]. She was seen with her husband today. He acted as Astronomer.  She presented to me originally with episodes of angina. She had a positive Myoview study. Cardiac cath revealed significant three-vessel disease and she ultimately had coronary artery bypass grafting.  She has had some problems with constipation.  Otherwise she is doing well.   Current Outpatient Prescriptions on File Prior to Visit  Medication Sig Dispense Refill  . diltiazem (CARDIZEM SR) 90 MG 12 hr capsule Take 1 capsule (90 mg total) by mouth every 12 (twelve) hours.  60 capsule  1  . esomeprazole (NEXIUM) 40 MG capsule Take 40 mg by mouth daily before breakfast.      . Fluticasone-Salmeterol (ADVAIR) 250-50 MCG/DOSE AEPB Inhale 1 puff into the lungs 2 (two) times daily.  60 each  1  . furosemide (LASIX) 40 MG tablet Take 1 tablet (40 mg total) by mouth daily. For 3 days  3 tablet  0  . glipiZIDE (GLUCOTROL) 5 MG tablet Take 5 mg by mouth daily.      . iron polysaccharides (NIFEREX) 150 MG capsule Take 1 capsule (150 mg total) by mouth daily.  30 capsule  0  . metFORMIN (GLUCOPHAGE) 500 MG tablet Take 500 mg by mouth 2 (two) times daily with a meal.      . pravastatin (PRAVACHOL) 80 MG tablet Take 80 mg by mouth daily.      . traMADol (ULTRAM) 50 MG tablet Take 50 mg by mouth every 6 (six) hours as needed.        No Known Allergies  Past Medical History  Diagnosis Date  . Hypertension   . Hyperlipidemia   . Diabetes mellitus   . Abdominal pain   .  Chest pain     Atypical  . GERD (gastroesophageal reflux disease)     Past Surgical History  Procedure Date  . Coronary artery bypass graft 10/04/2011  . Coronary artery bypass graft 10/05/2011    Procedure: CORONARY ARTERY BYPASS GRAFTING (CABG);  Surgeon: Ivin Poot, MD;  Location: [facility];  Service: Open Heart Surgery;  Laterality: N/A;  TEE    History  Smoking status  . Never Smoker   Smokeless tobacco  . Not on file    History  Alcohol Use No    No family history on file.  Reviw of Systems:  Reviewed in the HPI.  All other systems are negative.  Physical Exam: Blood pressure 112/68, pulse 80, height 4\' 7"  (1.397 m), weight 103 lb 3.2 oz (46.811 kg). General: Well developed, well nourished, in no acute distress.  Head: Normocephalic, atraumatic, sclera non-icteric, mucus membranes are moist,   Neck: Supple. Carotids are 2 + without bruits. No JVD  Lungs: Clear bilaterally to auscultation.  Heart: regular rate.  normal  S1 S2. No murmurs, gallops or rubs.  Abdomen: Soft, non-tender, non-distended with normal bowel sounds. No hepatomegaly. No rebound/guarding. No masses.  Msk:  Strength and tone  are normal  Extremities: No clubbing or cyanosis. No edema.  Distal pedal pulses are 2+ and equal bilaterally.  Neuro: Alert and oriented X 3. Moves all extremities spontaneously.  Psych:  Responds to questions appropriately with a normal affect.  ECG: Her EKG from several weeks ago reveals normal sinus rhythm at 71 beats a minute. She has nonspecific ST and T wave abnormalities.  Assessment / Plan:

## 2011-11-10 ENCOUNTER — Telehealth: Payer: Self-pay | Admitting: Cardiovascular Disease

## 2011-11-10 ENCOUNTER — Other Ambulatory Visit: Payer: Self-pay | Admitting: Cardiothoracic Surgery

## 2011-11-10 DIAGNOSIS — I2510 Atherosclerotic heart disease of native coronary artery without angina pectoris: Secondary | ICD-10-CM

## 2011-11-10 NOTE — Telephone Encounter (Signed)
lmtcb

## 2011-11-10 NOTE — Telephone Encounter (Signed)
Pt called back and results given, sent to new pcp

## 2011-11-10 NOTE — Telephone Encounter (Signed)
Fu clal Patient returning your call

## 2011-11-22 ENCOUNTER — Ambulatory Visit (INDEPENDENT_AMBULATORY_CARE_PROVIDER_SITE_OTHER): Payer: Self-pay | Admitting: Cardiothoracic Surgery

## 2011-11-22 ENCOUNTER — Ambulatory Visit
Admission: RE | Admit: 2011-11-22 | Discharge: 2011-11-22 | Disposition: A | Discharge status: Home / Self Care | Payer: Medicaid Other | Source: Ambulatory Visit | Attending: Cardiothoracic Surgery | Admitting: Cardiothoracic Surgery

## 2011-11-22 ENCOUNTER — Encounter: Payer: Self-pay | Admitting: Cardiothoracic Surgery

## 2011-11-22 VITALS — BP 96/64 | HR 96 | Resp 18 | Ht <= 58 in | Wt 106.0 lb

## 2011-11-22 DIAGNOSIS — I2510 Atherosclerotic heart disease of native coronary artery without angina pectoris: Secondary | ICD-10-CM

## 2011-11-22 DIAGNOSIS — Z951 Presence of aortocoronary bypass graft: Secondary | ICD-10-CM

## 2011-11-22 NOTE — Progress Notes (Signed)
Patient ID: Amanda [name], female   DOB: April 17, 1946, 66 y.o.   MRN: [id number] The patient is a 66 year old African female who returns for followup after cord bypass grafting over month ago. On her last visit 2 weeks ago a moderate right pleural effusion was noted which was a new finding. She was given a course of oral Lasix and Zaroxolyn and returns now with a followup chest x-ray. The patient denies any shortness of breath.  On exam she is afebrile in sinus rhythm blood pressure 98/50. Breath sounds are clear bilaterally Cardiac rhythm is regular and sternal incision is well-healed There is no pedal edema  Chest x-ray today shows complete resolution of the right pleural effusion.  Plan continue current medications and postoperative rehabilitation. The patient return for a followup final chest x-ray in 4-6 weeks.

## 2012-01-01 ENCOUNTER — Ambulatory Visit: Payer: Self-pay | Admitting: Cardiovascular Disease

## 2012-01-02 ENCOUNTER — Other Ambulatory Visit: Payer: Self-pay | Admitting: Cardiothoracic Surgery

## 2012-01-02 DIAGNOSIS — I2510 Atherosclerotic heart disease of native coronary artery without angina pectoris: Secondary | ICD-10-CM

## 2012-01-03 ENCOUNTER — Ambulatory Visit: Payer: Medicaid Other | Admitting: Cardiothoracic Surgery

## 2012-01-05 ENCOUNTER — Encounter: Payer: Self-pay | Admitting: Cardiothoracic Surgery

## 2012-01-05 ENCOUNTER — Ambulatory Visit (INDEPENDENT_AMBULATORY_CARE_PROVIDER_SITE_OTHER): Payer: Medicaid Other | Admitting: Cardiothoracic Surgery

## 2012-01-05 ENCOUNTER — Other Ambulatory Visit: Payer: Self-pay | Admitting: *Deleted

## 2012-01-05 ENCOUNTER — Ambulatory Visit
Admission: RE | Admit: 2012-01-05 | Discharge: 2012-01-05 | Disposition: A | Discharge status: Home / Self Care | Payer: Medicaid Other | Source: Ambulatory Visit | Attending: Cardiothoracic Surgery | Admitting: Cardiothoracic Surgery

## 2012-01-05 VITALS — BP 114/71 | HR 71 | Resp 16 | Ht <= 58 in | Wt 108.5 lb

## 2012-01-05 DIAGNOSIS — Z951 Presence of aortocoronary bypass graft: Secondary | ICD-10-CM

## 2012-01-05 DIAGNOSIS — G8918 Other acute postprocedural pain: Secondary | ICD-10-CM

## 2012-01-05 DIAGNOSIS — I2510 Atherosclerotic heart disease of native coronary artery without angina pectoris: Secondary | ICD-10-CM

## 2012-01-05 MED ORDER — TRAMADOL HCL 50 MG PO TABS: 50.0000 mg | ORAL_TABLET | Freq: Four times a day (QID) | ORAL | Status: DC | PRN

## 2012-01-05 NOTE — Progress Notes (Signed)
PCP is Dorna Mai, MD Referring Provider is Sherren Mocha, MD  Chief Complaint  Patient presents with  . Routine Post Op    6 week f/u with CXR, S/P CABG x 3 on 10/05/11    HPI: Doing well over 3 months after multivessel bypass grafting.  Incision is now well-healed, right pleural effusion is completely resolved. Mild sternal pain well-controlled with oral tramadol.   Past Medical History  Diagnosis Date  . Hypertension   . Hyperlipidemia   . Diabetes mellitus   . Abdominal pain   . Chest pain     Atypical  . GERD (gastroesophageal reflux disease)     Past Surgical History  Procedure Date  . Coronary artery bypass graft 10/04/2011  . Coronary artery bypass graft 10/05/2011    Procedure: CORONARY ARTERY BYPASS GRAFTING (CABG);  Surgeon: Ivin Poot, MD;  Location: [facility];  Service: Open Heart Surgery;  Laterality: N/A;  TEE    No family history on file.  Social History History  Substance Use Topics  . Smoking status: Never Smoker   . Smokeless tobacco: Not on file  . Alcohol Use: No    Current Outpatient Prescriptions  Medication Sig Dispense Refill  . aspirin 81 MG tablet Take 81 mg by mouth daily.      Marland Kitchen esomeprazole (NEXIUM) 40 MG capsule Take 40 mg by mouth daily before breakfast.      . glipiZIDE (GLUCOTROL) 5 MG tablet Take 5 mg by mouth daily.      . hydrochlorothiazide (HYDRODIURIL) 25 MG tablet Take 25 mg by mouth daily.      Marland Kitchen losartan (COZAAR) 100 MG tablet Take 100 mg by mouth daily.      . metFORMIN (GLUCOPHAGE) 500 MG tablet Take 500 mg by mouth 2 (two) times daily with a meal.      . pravastatin (PRAVACHOL) 80 MG tablet Take 80 mg by mouth daily.      . traMADol (ULTRAM) 50 MG tablet Take 50 mg by mouth every 6 (six) hours as needed.        No Known Allergies  Review of Systems good appetite good energy walking daily  BP 114/71  Pulse 71  Resp 16  Ht 4\' 7"  (1.397 m)  Wt 108 lb 8 oz (49.215 kg)  BMI 25.22 kg/m2  SpO2 100% Physical  Exam Alert and comfortable Lungs clear Sternum well-healed No pedal edema Cardiac rhythm regular without murmur or gallop   Diagnostic Tests: Chest x-ray was stable cardiac silhouette no pleural effusion sternal wires intact Impression:   Plan: Return to primary care physician return here as needed

## 2012-01-29 ENCOUNTER — Ambulatory Visit (INDEPENDENT_AMBULATORY_CARE_PROVIDER_SITE_OTHER): Payer: Medicaid Other | Admitting: Cardiovascular Disease

## 2012-01-29 ENCOUNTER — Encounter: Payer: Self-pay | Admitting: Cardiovascular Disease

## 2012-01-29 VITALS — BP 117/76 | HR 76 | Ht <= 58 in | Wt 108.1 lb

## 2012-01-29 DIAGNOSIS — Z951 Presence of aortocoronary bypass graft: Secondary | ICD-10-CM

## 2012-01-29 DIAGNOSIS — I10 Essential (primary) hypertension: Secondary | ICD-10-CM

## 2012-01-29 DIAGNOSIS — I25810 Atherosclerosis of coronary artery bypass graft(s) without angina pectoris: Secondary | ICD-10-CM

## 2012-01-29 DIAGNOSIS — I2510 Atherosclerotic heart disease of native coronary artery without angina pectoris: Secondary | ICD-10-CM

## 2012-01-29 NOTE — Patient Instructions (Addendum)
Your physician recommends that you continue on your current medications as directed. Please refer to the Current Medication list given to you today.   Your physician recommends that you return for a FASTING lipid profile: IN 6 MONTHS FOR A LIPID/LIVER/BMET WITH APPOINTMENT  Your physician wants you to follow-up in: [place]. Acie Fredrickson.   You will receive a reminder letter in the mail two months in advance. If you don't receive a letter, please call our office to schedule the follow-up appointment.

## 2012-01-29 NOTE — Assessment & Plan Note (Signed)
The patient is doing very well. She's now 3 months out from her  CABG surgery. She is still having lots of chest soreness. I've encouraged her to take ibuprofen-1-2 tablets twice a day.  She seems to be frightened to move because she still has some chest tenderness. I've encouraged her to do lots of stretching exercises. She still has a 30 pound weight limit for  the next 3 months.  I'll see her again in 6 months for followup visit, fasting lipids, liver enzyme, and basic metabolic profile.

## 2012-01-29 NOTE — Progress Notes (Signed)
Amanda [name] Date of Birth  1945/08/18       [facility] Office [address], [address], [place], [place]  [address]   [place], [place]  [address] [phone]     [phone]   Fax  [phone]    Fax [phone]  Problem List: 1. CAD - CABG 2. Diabetes mellitus type 2 3. Hypertension 4. Hyperlipidemia  History of Present Illness:  Amanda [name] is a 67 she will female from [place]. She was seen with her husband today. He acted as Astronomer.  She presented to me originally with episodes of angina. She had a positive Myoview study. Cardiac cath revealed significant three-vessel disease and she ultimately had coronary artery bypass grafting.  She has had some problems with constipation.  Otherwise she is doing well.   Current Outpatient Prescriptions on File Prior to Visit  Medication Sig Dispense Refill  . aspirin 81 MG tablet Take 81 mg by mouth daily.      Marland Kitchen esomeprazole (NEXIUM) 40 MG capsule Take 40 mg by mouth daily before breakfast.      . glipiZIDE (GLUCOTROL) 5 MG tablet Take 5 mg by mouth daily.      . hydrochlorothiazide (HYDRODIURIL) 25 MG tablet Take 25 mg by mouth daily.      Marland Kitchen losartan (COZAAR) 100 MG tablet Take 100 mg by mouth daily.      . metFORMIN (GLUCOPHAGE) 500 MG tablet Take 500 mg by mouth 2 (two) times daily with a meal.      . pravastatin (PRAVACHOL) 80 MG tablet Take 80 mg by mouth daily.      . traMADol (ULTRAM) 50 MG tablet Take 1 tablet (50 mg total) by mouth every 6 (six) hours as needed.  30 tablet  0    No Known Allergies  Past Medical History  Diagnosis Date  . Hypertension   . Hyperlipidemia   . Diabetes mellitus   . Abdominal pain   . Chest pain     Atypical  . GERD (gastroesophageal reflux disease)     Past Surgical History  Procedure Date  . Coronary artery bypass graft 10/04/2011  . Coronary artery bypass graft 10/05/2011    Procedure: CORONARY ARTERY BYPASS GRAFTING  (CABG);  Surgeon: Ivin Poot, MD;  Location: [facility];  Service: Open Heart Surgery;  Laterality: N/A;  TEE    History  Smoking status  . Never Smoker   Smokeless tobacco  . Not on file    History  Alcohol Use No    No family history on file.  Reviw of Systems:  Reviewed in the HPI.  All other systems are negative.  Physical Exam: Blood pressure 117/76, pulse 76, height 4\' 7"  (1.397 m), weight 108 lb 1.9 oz (49.043 kg). General: Well developed, well nourished, in no acute distress.  Head: Normocephalic, atraumatic, sclera non-icteric, mucus membranes are moist,   Neck: Supple. Carotids are 2 + without bruits. No JVD  Lungs: Clear bilaterally to auscultation.  Heart: regular rate.  normal  S1 S2. No murmurs, gallops or rubs.  She has slight keloid formation on her sternotomy scar. The incision has healed normally otherwise.  The thoracostomy sites are well healed.  Abdomen: Soft, non-tender, non-distended with normal bowel sounds. No hepatomegaly. No rebound/guarding. No masses.  Msk:  Strength and tone are normal  Extremities: No clubbing or cyanosis. No edema.  Distal pedal pulses are 2+ and equal bilaterally.  Neuro: Alert and oriented X 3. Moves all extremities spontaneously.  Psych:  Responds to questions appropriately with a normal affect.  ECG: Sept. 16, 2013.  normal sinus rhythm at 67 beats a minute. She has nonspecific ST and T wave abnormalities.  Assessment / Plan:

## 2013-06-28 IMAGING — CR DG CHEST 1V PORT
1 series · 1 of 1 positions shown · non-contrast
Comparison: 10/05/2011

CLINICAL DATA: Open heart surgery

PORTABLE CHEST - 1 VIEW

[AP]
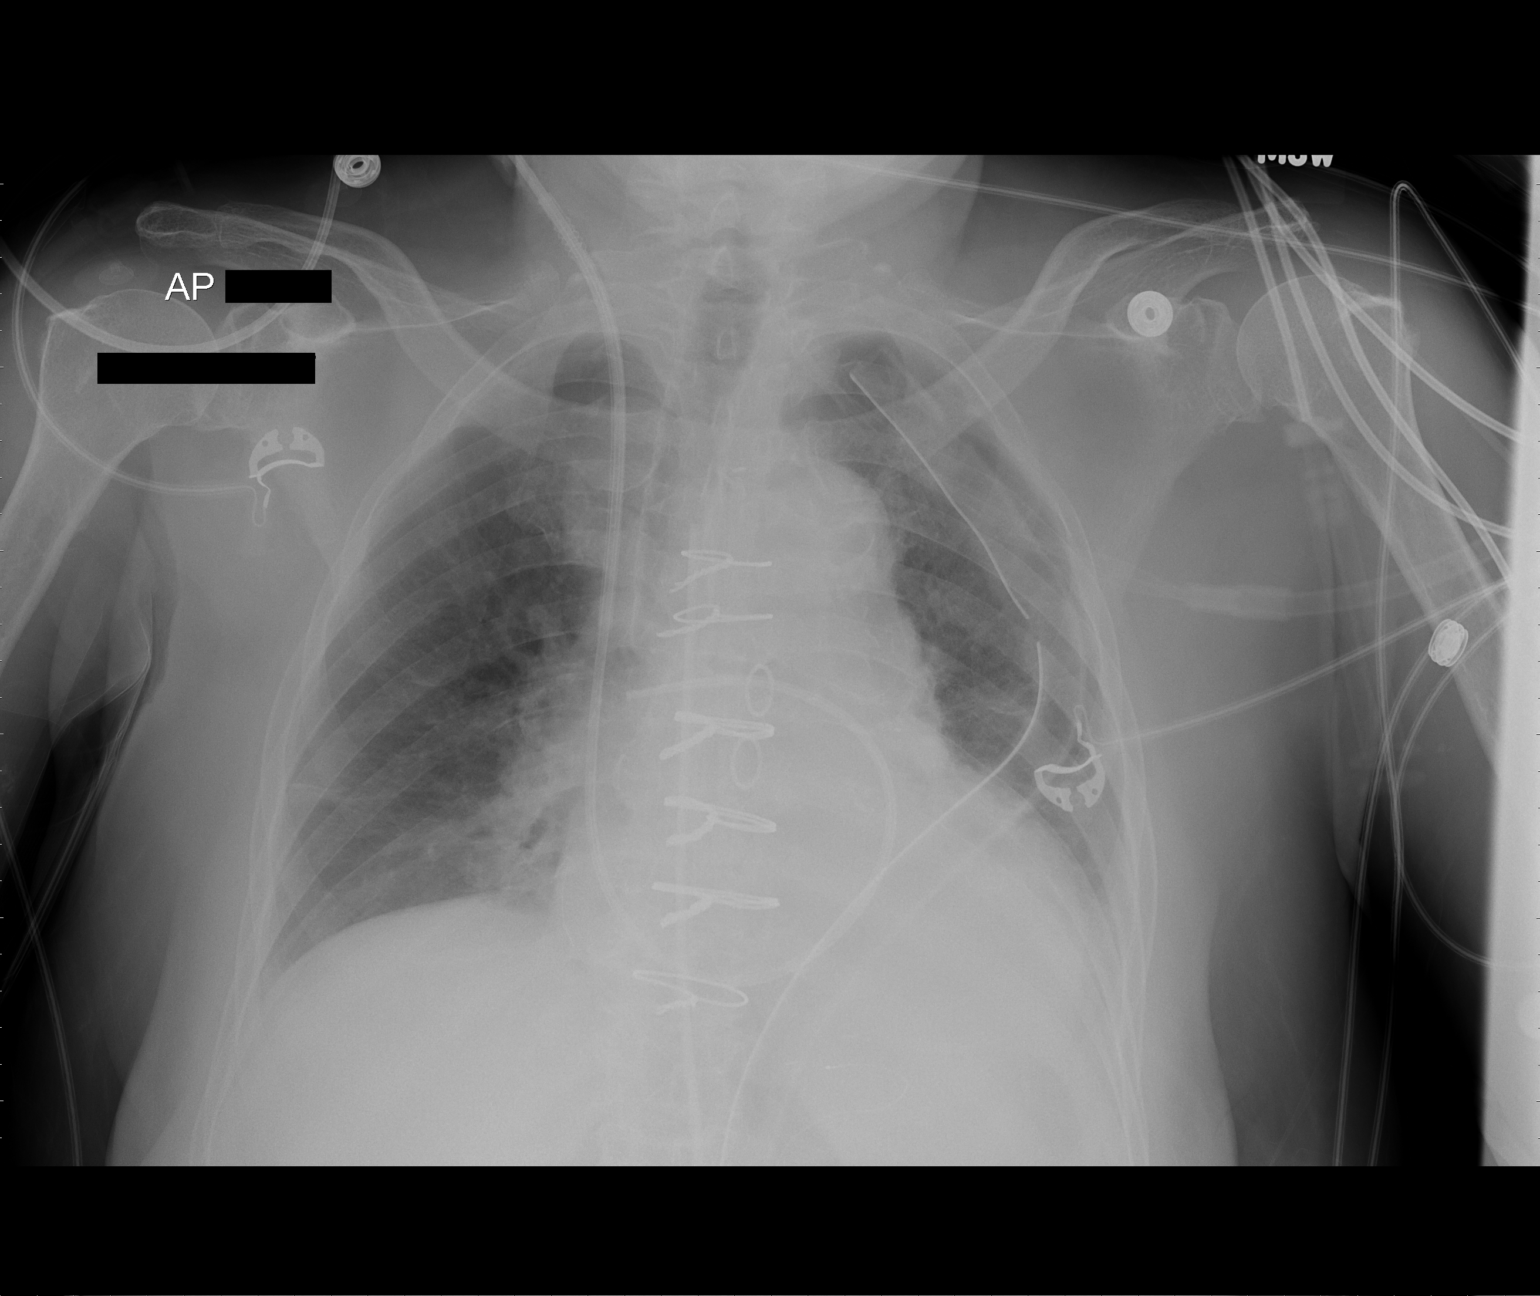

[1 of 1 positions shown; findings below may reference images not displayed]

FINDINGS: 6934 hours.  Left chest tube remains in place.  No
evidence for pneumothorax.  Endotracheal and NG tubes have been
removed in the interval.  Right IJ pulmonary catheter is in the
interlobar pulmonary artery.  The midline mediastinal / pericardial
drain persists. The cardiopericardial silhouette is enlarged.  Left
base atelectasis has progressed in the interval.
IMPRESSION: Interval extubation and NG tube removal.

Slight increase in left base atelectasis.

## 2013-06-30 IMAGING — CR DG CHEST 1V PORT
1 series · 1 of 1 positions shown · non-contrast
Comparison: 10/07/2011

CLINICAL DATA: Post surgery

PORTABLE CHEST - 1 VIEW

[AP]
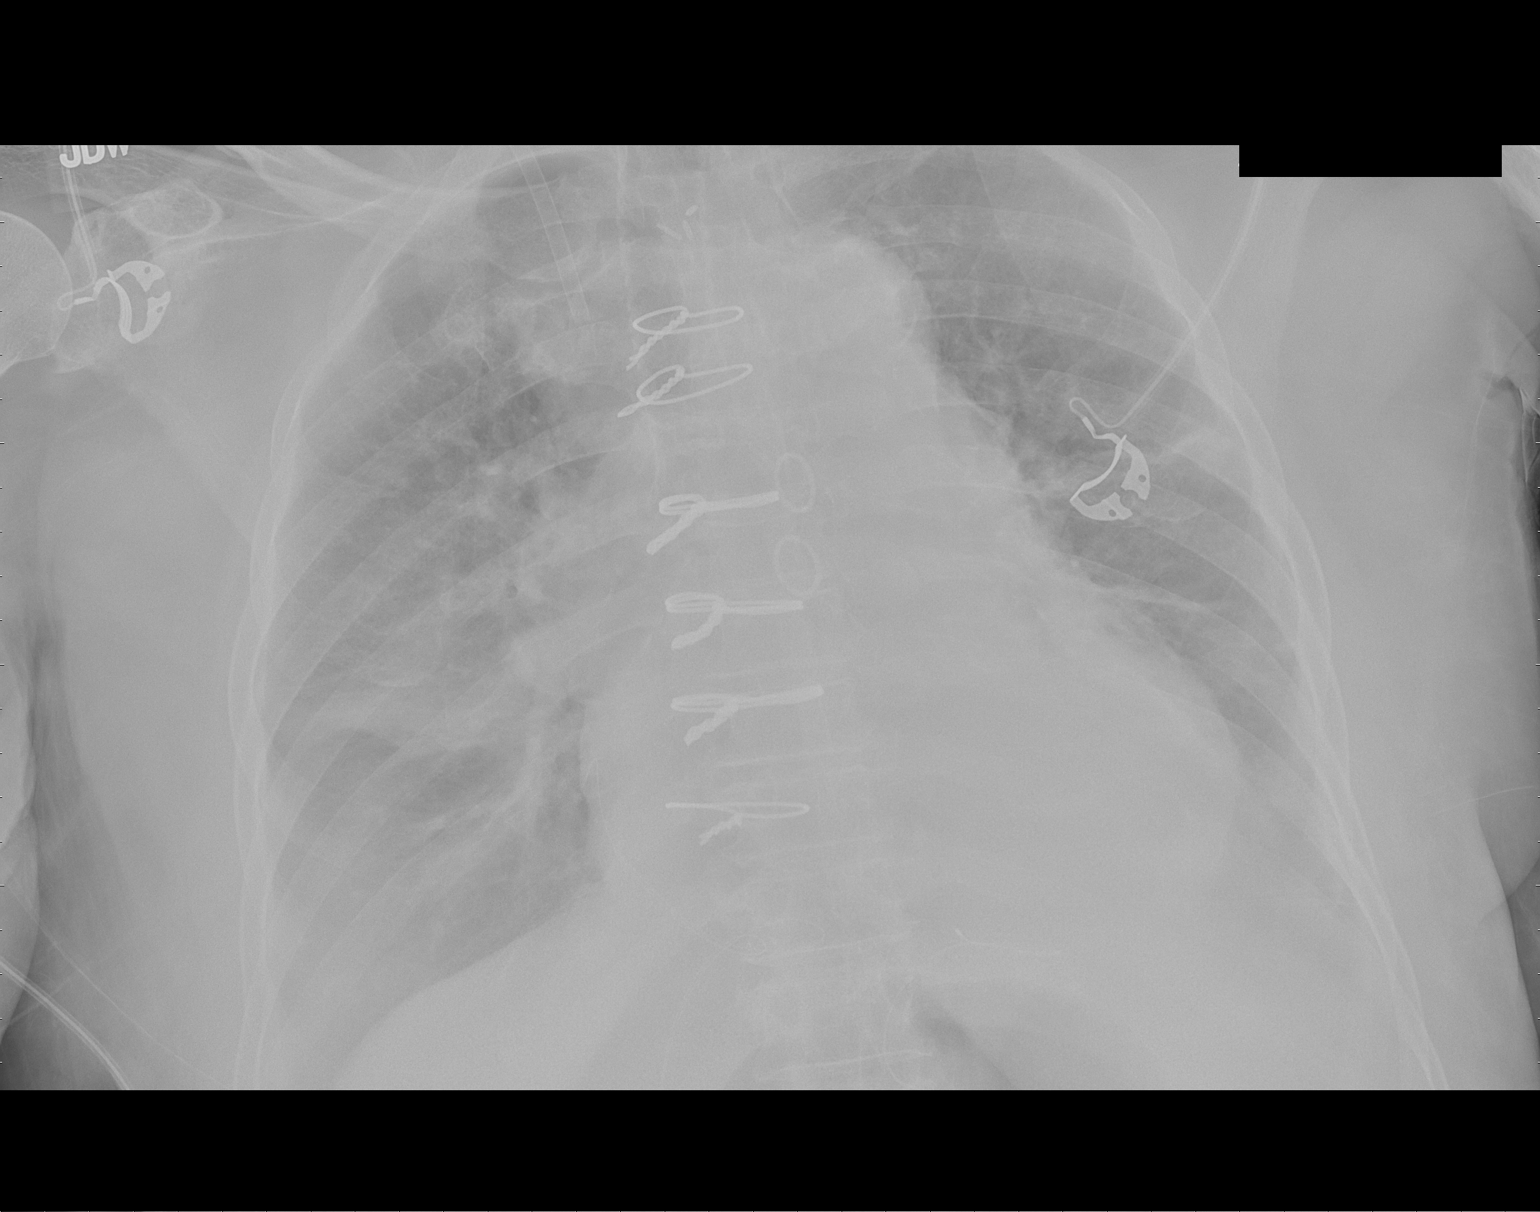

[1 of 1 positions shown; findings below may reference images not displayed]

FINDINGS: Cardiomegaly with mild interstitial edema.  Mild right
perihilar opacity is favored to reflect the centric edema or
loculated fluid.  Small bilateral pleural effusions with associated
atelectasis.  No pneumothorax.

Postsurgical changes related to prior CABG.

Stable right IJ venous sheath.
IMPRESSION: Cardiomegaly with mild interstitial edema and small bilateral
pleural effusions.

## 2013-07-01 IMAGING — CR DG CHEST 2V
2 series · 2 of 2 positions shown · non-contrast
Comparison: [DATE] and 10/04/2011

CLINICAL DATA: Shortness of breath.  Wheezing.  CABG on 10/05/2011.
Effusions.

CHEST - 2 VIEW

[w chest pa]
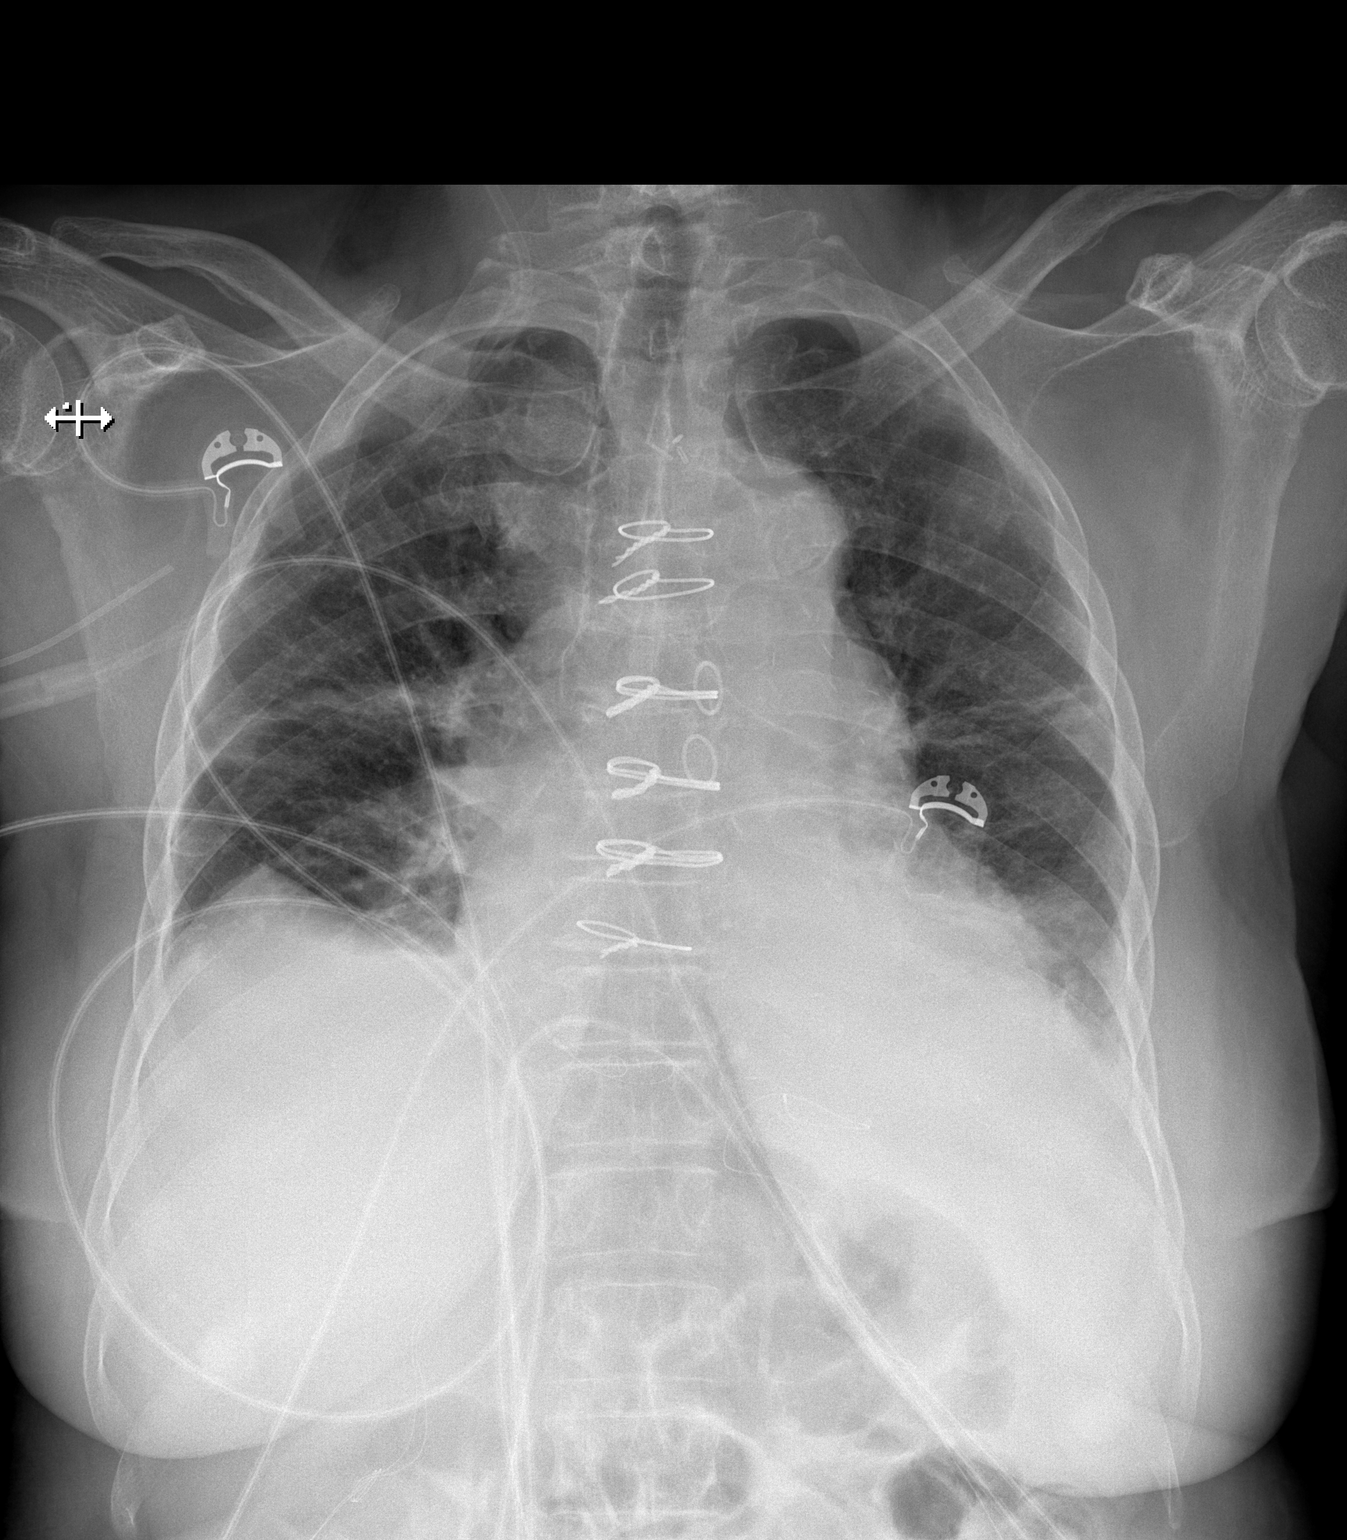

[w chest lat]
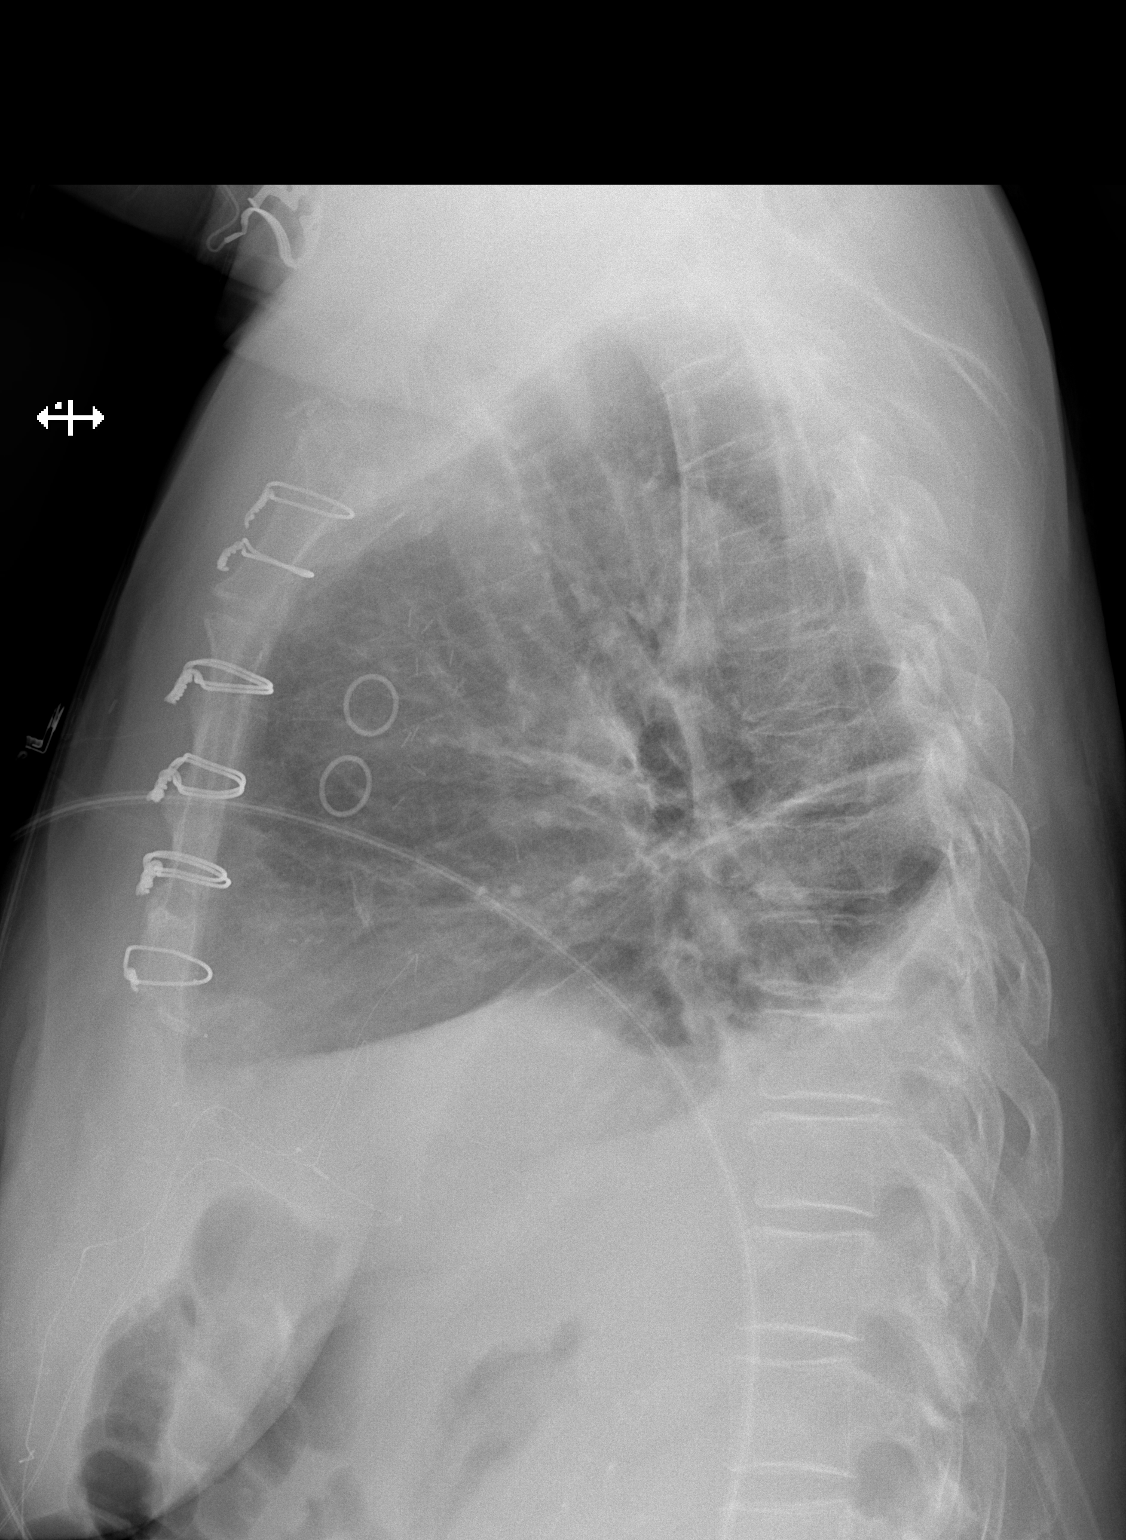

[2 of 2 positions shown; findings below may reference images not displayed]

FINDINGS: Central venous sheath has been removed.  No pneumothorax.
Improved aeration in the right midzone and at the left base.  Small
residual bilateral pleural effusions.  Slight atelectasis in the
mid and lower lung zones.
IMPRESSION: Improving aeration of both lungs.  Small residual effusions.

## 2013-10-13 ENCOUNTER — Emergency Department (INDEPENDENT_AMBULATORY_CARE_PROVIDER_SITE_OTHER): Payer: Medicare Other

## 2013-10-13 ENCOUNTER — Encounter (HOSPITAL_COMMUNITY): Payer: Self-pay | Admitting: Emergency Medicine

## 2013-10-13 ENCOUNTER — Emergency Department (HOSPITAL_COMMUNITY)
Admission: EM | Admit: 2013-10-13 | Discharge: 2013-10-13 | Disposition: A | Discharge status: Home / Self Care | Payer: Medicare Other | Source: Home / Self Care

## 2013-10-13 DIAGNOSIS — R0789 Other chest pain: Secondary | ICD-10-CM

## 2013-10-13 DIAGNOSIS — I2510 Atherosclerotic heart disease of native coronary artery without angina pectoris: Secondary | ICD-10-CM

## 2013-10-13 DIAGNOSIS — R071 Chest pain on breathing: Secondary | ICD-10-CM | POA: Diagnosis not present

## 2013-10-13 DIAGNOSIS — I10 Essential (primary) hypertension: Secondary | ICD-10-CM

## 2013-10-13 DIAGNOSIS — Z9861 Coronary angioplasty status: Secondary | ICD-10-CM

## 2013-10-13 DIAGNOSIS — T3995XA Adverse effect of unspecified nonopioid analgesic, antipyretic and antirheumatic, initial encounter: Secondary | ICD-10-CM

## 2013-10-13 MED ORDER — ONDANSETRON HCL 4 MG PO TABS: 4.0000 mg | ORAL_TABLET | Freq: Four times a day (QID) | ORAL | Status: DC

## 2013-10-13 NOTE — Discharge Instructions (Signed)
Chest Wall Pain Chest wall pain is pain in or around the bones and muscles of your chest. It may take up to 6 weeks to get better. It may take longer if you must stay physically active in your work and activities.  CAUSES  Chest wall pain may happen on its own. However, it may be caused by:  A viral illness like the flu.  Injury.  Coughing.  Exercise.  Arthritis.  Fibromyalgia.  Shingles. HOME CARE INSTRUCTIONS   Avoid overtiring physical activity. Try not to strain or perform activities that cause pain. This includes any activities using your chest or your abdominal and side muscles, especially if heavy weights are used.  Put ice on the sore area.  Put ice in a plastic bag.  Place a towel between your skin and the bag.  Leave the ice on for 15-20 minutes per hour while awake for the first 2 days.  Only take over-the-counter or prescription medicines for pain, discomfort, or fever as directed by your caregiver. SEEK IMMEDIATE MEDICAL CARE IF:   Your pain increases, or you are very uncomfortable.  You have a fever.  Your chest pain becomes worse.  You have new, unexplained symptoms.  You have nausea or vomiting.  You feel sweaty or lightheaded.  You have a cough with phlegm (sputum), or you cough up blood. MAKE SURE YOU:   Understand these instructions.  Will watch your condition.  Will get help right away if you are not doing well or get worse. Document Released: 05/01/2005 Document Revised: 07/24/2011 Document Reviewed: 12/26/2010 [facility] Patient Information 2014 [place], [place].  Hypertension As your heart beats, it forces blood through your arteries. This force is your blood pressure. If the pressure is too high, it is called hypertension (HTN) or high blood pressure. HTN is dangerous because you may have it and not know it. High blood pressure may mean that your heart has to work harder to pump blood. Your arteries may be narrow or stiff. The extra work  puts you at risk for heart disease, stroke, and other problems.  Blood pressure consists of two numbers, a higher number over a lower, 110/72, for example. It is stated as "110 over 72." The ideal is below 120 for the top number (systolic) and under 80 for the bottom (diastolic). Write down your blood pressure today. You should pay close attention to your blood pressure if you have certain conditions such as:  Heart failure.  Prior heart attack.  Diabetes  Chronic kidney disease.  Prior stroke.  Multiple risk factors for heart disease. To see if you have HTN, your blood pressure should be measured while you are seated with your arm held at the level of the heart. It should be measured at least twice. A one-time elevated blood pressure reading (especially in the Emergency Department) does not mean that you need treatment. There may be conditions in which the blood pressure is different between your right and left arms. It is important to see your caregiver soon for a recheck. Most people have essential hypertension which means that there is not a specific cause. This type of high blood pressure may be lowered by changing lifestyle factors such as:  Stress.  Smoking.  Lack of exercise.  Excessive weight.  Drug/tobacco/alcohol use.  Eating less salt. Most people do not have symptoms from high blood pressure until it has caused damage to the body. Effective treatment can often prevent, delay or reduce that damage. TREATMENT  When a  cause has been identified, treatment for high blood pressure is directed at the cause. There are a large number of medications to treat HTN. These fall into several categories, and your caregiver will help you select the medicines that are best for you. Medications may have side effects. You should review side effects with your caregiver. If your blood pressure stays high after you have made lifestyle changes or started on medicines,   Your medication(s) may  need to be changed.  Other problems may need to be addressed.  Be certain you understand your prescriptions, and know how and when to take your medicine.  Be sure to follow up with your caregiver within the time frame advised (usually within two weeks) to have your blood pressure rechecked and to review your medications.  If you are taking more than one medicine to lower your blood pressure, make sure you know how and at what times they should be taken. Taking two medicines at the same time can result in blood pressure that is too low. SEEK IMMEDIATE MEDICAL CARE IF:  You develop a severe headache, blurred or changing vision, or confusion.  You have unusual weakness or numbness, or a faint feeling.  You have severe chest or abdominal pain, vomiting, or breathing problems. MAKE SURE YOU:   Understand these instructions.  Will watch your condition.  Will get help right away if you are not doing well or get worse. Document Released: 05/01/2005 Document Revised: 07/24/2011 Document Reviewed: 12/20/2007 [facility] Patient Information 2014 [place].

## 2013-10-13 NOTE — ED Provider Notes (Signed)
CSN: [id number]     Arrival date & time 10/13/13  0935 History   First MD Initiated Contact with Patient 10/13/13 [phone]     Chief Complaint  Patient presents with  . Chest Pain   (Consider location/radiation/quality/duration/timing/severity/associated sxs/prior Treatment) HPI Comments: 68 year old female Amanda [name] is accompanied by her son who is the interpreter. She has been having mid anterior chest pain for the past few days. The pain tracks along the incision line over the midsternum for which she had CABG 3 years ago. She is a poor historian she does state the pain tends to be a contraction from her outer chest bilaterally to the center. Denies shortness of breath, cough, dyspnea, orthopnea, nausea, vomiting or diaphoresis. She takes tramadol for pain and this relieves it however it does cause nausea and she is now declining to take it.   Past Medical History  Diagnosis Date  . Hypertension   . Hyperlipidemia   . Diabetes mellitus   . Abdominal pain   . Chest pain     Atypical  . GERD (gastroesophageal reflux disease)    Past Surgical History  Procedure Laterality Date  . Coronary artery bypass graft  10/04/2011  . Coronary artery bypass graft  10/05/2011    Procedure: CORONARY ARTERY BYPASS GRAFTING (CABG);  Surgeon: Ivin Poot, MD;  Location: [facility];  Service: Open Heart Surgery;  Laterality: N/A;  TEE   History reviewed. No pertinent family history. History  Substance Use Topics  . Smoking status: Never Smoker   . Smokeless tobacco: Not on file  . Alcohol Use: No   OB History   Grav Para Term Preterm Abortions TAB SAB Ect Mult Living                 Review of Systems  Constitutional: Positive for activity change. Negative for fever and fatigue.  Respiratory: Negative for cough, shortness of breath and wheezing.   Cardiovascular: Positive for chest pain. Negative for palpitations and leg swelling.  Gastrointestinal: Positive for nausea.       The nausea is only  associated after taking tramadol.  Genitourinary: Negative.   Musculoskeletal: Negative.   Neurological: Negative.     Allergies  Review of patient's allergies indicates no known allergies.  Home Medications   Prior to Admission medications   Medication Sig Start Date End Date Taking? Authorizing Provider  aspirin 81 MG tablet Take 81 mg by mouth daily.    Historical Provider, MD  esomeprazole (NEXIUM) 40 MG capsule Take 40 mg by mouth daily before breakfast.    Historical Provider, MD  glipiZIDE (GLUCOTROL) 5 MG tablet Take 5 mg by mouth daily.    Historical Provider, MD  hydrochlorothiazide (HYDRODIURIL) 25 MG tablet Take 25 mg by mouth daily.    Historical Provider, MD  ibuprofen (ADVIL,MOTRIN) 600 MG tablet Take 600 mg by mouth every 6 (six) hours as needed.    Historical Provider, MD  losartan (COZAAR) 100 MG tablet Take 100 mg by mouth daily.    Historical Provider, MD  metFORMIN (GLUCOPHAGE) 500 MG tablet Take 500 mg by mouth 2 (two) times daily with a meal.    Historical Provider, MD  ondansetron (ZOFRAN) 4 MG tablet Take 1 tablet (4 mg total) by mouth every 6 (six) hours. 10/13/13   Janne Napoleon, NP  pravastatin (PRAVACHOL) 80 MG tablet Take 80 mg by mouth daily.    Historical Provider, MD  traMADol (ULTRAM) 50 MG tablet Take 1 tablet (50 mg total) by  mouth every 6 (six) hours as needed. 01/05/12   Ivin Poot, MD   BP 121/75  Pulse 82  Temp(Src) 98.4 F (36.9 C) (Oral)  Resp 16  SpO2 100% Physical Exam  Nursing note and vitals reviewed. Constitutional: She is oriented to person, place, and time. She appears well-developed and well-nourished. No distress.  Patient is sitting quiet but alert on the table. She is warm and dry, no acute distress. Demonstrates no signs of dyspnea or fatigue She is talkative in her native language attempting to respond to her son's questions during translation.  HENT:  Mouth/Throat: Oropharynx is clear and moist. No oropharyngeal exudate.   Eyes: Conjunctivae and EOM are normal.  Neck: Normal range of motion. Neck supple.  Cardiovascular: Normal rate and regular rhythm.   Murmur heard. Systolic murmur over the right upper sternal border, systolic murmur over the left midsternal border.  Pulmonary/Chest: Effort normal. She exhibits tenderness.  Few crackles in the left lower to mid lung field.  There is a vertical scar with keloid formation located over the sternum created by the incision for the CABG. Palpation directly over this scar reproduces the same pain for which she presents. Palpation of the remainder of the chest does not produce pain. There is no erythema, swelling or abnormal deformities.  Musculoskeletal: She exhibits no edema and no tenderness.  Lymphadenopathy:    She has no cervical adenopathy.  Neurological: She is alert and oriented to person, place, and time. She exhibits normal muscle tone.  Skin: Skin is warm and dry.  Psychiatric: She has a normal mood and affect.    ED Course  Procedures (including critical care time) Labs Review Labs Reviewed - No data to display  Imaging Review Dg Chest 2 View  10/13/2013   CLINICAL DATA:  Chest pain.  EXAM: CHEST  2 VIEW  COMPARISON:  01/05/2012.  FINDINGS: Trachea is midline. Heart size stable. There is bibasilar airspace disease. No pleural fluid. Six intact sternotomy wires.  IMPRESSION: Mild bibasilar airspace disease may be due to pneumonia. Please correlate clinically.   Electronically Signed   By: Lorin Picket M.D.   On: 10/13/2013 10:37     MDM   1. Chest wall pain   2. Adverse effect of analgesic   3. CAD S/P percutaneous coronary angioplasty   4. Benign essential HTN     Patient is quite stable during the exam and waiting time in the urgent care. EKG is unchanged from 01/29/2012. There is nonspecific T wave abnormality particularly in the lateral chest leads but not unlike that of a previous EKG. There is also an injured ventricular conduction  delay that does not meet criteria for bundle branch block. The chest x-ray interpretation revealed " bilateral lower airspace disease and to correlate clinically. This may be due to pneumonia." The clinical correlation as very low suspicion for pulmonary infectious disease at this time. The patient is otherwise asymptomatic and afebrile. The plan is to provide the patient with Zofran to mitigate the nauseating effects of the tramadol. Showed she get worse in any way, develops increasing chest pain, shortness of breath, cough, fever, sweating or other symptoms called 911 or take her to the emergency department promptly. Based on the clinical exam and information we have at believe it reasonable to send the patient home without antibiotics and have her followup with her PCP or a cardiologist. Consulted with Dr. Jake Michaelis prior to discharge.      Janne Napoleon, NP 10/13/13 [phone]

## 2013-10-13 NOTE — ED Notes (Signed)
Pt  Has  A  History  Of   Open  Heart  Surgery  sev  yerars  Ago  She  Reports  Chest  Pain x  5  Days  With  Pain on palpation  Chest  As  Well  As   Sensation of  Pulling  As  Well  Her  Son is  With    Her    And    Being an interpretor

## 2013-10-14 NOTE — ED Provider Notes (Signed)
Medical screening examination/treatment/procedure(s) were performed by non-physician practitioner and as supervising physician I was immediately available for consultation/collaboration.  Philipp Deputy, M.D.  Harden Mo, MD 10/14/13 2206

## 2014-04-23 ENCOUNTER — Encounter (HOSPITAL_COMMUNITY): Payer: Self-pay | Admitting: Cardiovascular Disease

## 2016-01-08 ENCOUNTER — Other Ambulatory Visit: Payer: Self-pay | Admitting: Internal Medicine

## 2016-01-08 DIAGNOSIS — E2839 Other primary ovarian failure: Secondary | ICD-10-CM

## 2017-11-30 ENCOUNTER — Other Ambulatory Visit: Payer: Self-pay

## 2017-11-30 ENCOUNTER — Ambulatory Visit (INDEPENDENT_AMBULATORY_CARE_PROVIDER_SITE_OTHER): Payer: Medicare Other

## 2017-11-30 ENCOUNTER — Encounter: Payer: Self-pay | Admitting: Podiatry

## 2017-11-30 ENCOUNTER — Ambulatory Visit (INDEPENDENT_AMBULATORY_CARE_PROVIDER_SITE_OTHER): Payer: Medicare Other | Admitting: Podiatry

## 2017-11-30 ENCOUNTER — Other Ambulatory Visit: Payer: Self-pay | Admitting: Podiatry

## 2017-11-30 VITALS — BP 122/66 | HR 78

## 2017-11-30 DIAGNOSIS — M79671 Pain in right foot: Secondary | ICD-10-CM | POA: Diagnosis not present

## 2017-11-30 DIAGNOSIS — B351 Tinea unguium: Secondary | ICD-10-CM

## 2017-11-30 DIAGNOSIS — E1142 Type 2 diabetes mellitus with diabetic polyneuropathy: Secondary | ICD-10-CM | POA: Diagnosis not present

## 2017-11-30 DIAGNOSIS — M79672 Pain in left foot: Principal | ICD-10-CM

## 2017-11-30 DIAGNOSIS — I739 Peripheral vascular disease, unspecified: Secondary | ICD-10-CM | POA: Diagnosis not present

## 2017-11-30 DIAGNOSIS — E1151 Type 2 diabetes mellitus with diabetic peripheral angiopathy without gangrene: Secondary | ICD-10-CM | POA: Diagnosis not present

## 2017-11-30 NOTE — Patient Instructions (Signed)
Peripheral Vascular Disease Peripheral vascular disease (PVD) is a disease of the blood vessels that are not part of your heart and brain. A simple term for PVD is poor circulation. In most cases, PVD narrows the blood vessels that carry blood from your heart to the rest of your body. This can result in a decreased supply of blood to your arms, legs, and internal organs, like your stomach or kidneys. However, it most often affects a person's lower legs and feet. There are two types of PVD.  Organic PVD. This is the more common type. It is caused by damage to the structure of blood vessels.  Functional PVD. This is caused by conditions that make blood vessels contract and tighten (spasm).  Without treatment, PVD tends to get worse over time. PVD can also lead to acute ischemic limb. This is when an arm or limb suddenly has trouble getting enough blood. This is a medical emergency. Follow these instructions at home:  Take medicines only as told by your doctor.  Do not use any tobacco products, including cigarettes, chewing tobacco, or electronic cigarettes. If you need help quitting, ask your doctor.  Lose weight if you are overweight, and maintain a healthy weight as told by your doctor.  Eat a diet that is low in fat and cholesterol. If you need help, ask your doctor.  Exercise regularly. Ask your doctor for some good activities for you.  Take good care of your feet. ? Wear comfortable shoes that fit well. ? Check your feet often for any cuts or sores. Contact a doctor if:  You have cramps in your legs while walking.  You have leg pain when you are at rest.  You have coldness in a leg or foot.  Your skin changes.  You are unable to get or have an erection (erectile dysfunction).  You have cuts or sores on your feet that are not healing. Get help right away if:  Your arm or leg turns cold and blue.  Your arms or legs become red, warm, swollen, painful, or numb.  You have  chest pain or trouble breathing.  You suddenly have weakness in your face, arm, or leg.  You become very confused or you cannot speak.  You suddenly have a very bad headache.  You suddenly cannot see. This information is not intended to replace advice given to you by your health care provider. Make sure you discuss any questions you have with your health care provider. Document Released: 07/26/2009 Document Revised: 10/07/2015 Document Reviewed: 10/09/2013 Elsevier Interactive Patient Education  2017 [employer]. Diabetes and Foot Care Diabetes may cause you to have problems because of poor blood supply (circulation) to your feet and legs. This may cause the skin on your feet to become thinner, break easier, and heal more slowly. Your skin may become dry, and the skin may peel and crack. You may also have nerve damage in your legs and feet causing decreased feeling in them. You may not notice minor injuries to your feet that could lead to infections or more serious problems. Taking care of your feet is one of the most important things you can do for yourself. Follow these instructions at home:  Wear shoes at all times, even in the house. Do not go barefoot. Bare feet are easily injured.  Check your feet daily for blisters, cuts, and redness. If you cannot see the bottom of your feet, use a mirror or ask someone for help.  Wash your feet  with warm water (do not use hot water) and mild soap. Then pat your feet and the areas between your toes until they are completely dry. Do not soak your feet as this can dry your skin.  Apply a moisturizing lotion or petroleum jelly (that does not contain alcohol and is unscented) to the skin on your feet and to dry, brittle toenails. Do not apply lotion between your toes.  Trim your toenails straight across. Do not dig under them or around the cuticle. File the edges of your nails with an emery board or nail file.  Do not cut corns or calluses or try to  remove them with medicine.  Wear clean socks or stockings every day. Make sure they are not too tight. Do not wear knee-high stockings since they may decrease blood flow to your legs.  Wear shoes that fit properly and have enough cushioning. To break in new shoes, wear them for just a few hours a day. This prevents you from injuring your feet. Always look in your shoes before you put them on to be sure there are no objects inside.  Do not cross your legs. This may decrease the blood flow to your feet.  If you find a minor scrape, cut, or break in the skin on your feet, keep it and the skin around it clean and dry. These areas may be cleansed with mild soap and water. Do not cleanse the area with peroxide, alcohol, or iodine.  When you remove an adhesive bandage, be sure not to damage the skin around it.  If you have a wound, look at it several times a day to make sure it is healing.  Do not use heating pads or hot water bottles. They may burn your skin. If you have lost feeling in your feet or legs, you may not know it is happening until it is too late.  Make sure your health care provider performs a complete foot exam at least annually or more often if you have foot problems. Report any cuts, sores, or bruises to your health care provider immediately. Contact a health care provider if:  You have an injury that is not healing.  You have cuts or breaks in the skin.  You have an ingrown nail.  You notice redness on your legs or feet.  You feel burning or tingling in your legs or feet.  You have pain or cramps in your legs and feet.  Your legs or feet are numb.  Your feet always feel cold. Get help right away if:  There is increasing redness, swelling, or pain in or around a wound.  There is a red line that goes up your leg.  Pus is coming from a wound.  You develop a fever or as directed by your health care provider.  You notice a bad smell coming from an ulcer or  wound. This information is not intended to replace advice given to you by your health care provider. Make sure you discuss any questions you have with your health care provider. Document Released: 04/28/2000 Document Revised: 10/07/2015 Document Reviewed: 10/08/2012 Elsevier Interactive Patient Education  2017 [employer].

## 2017-12-02 NOTE — Progress Notes (Signed)
Subjective: Ms. Roark presents to clinic today accompanied by her son. She is a 72 y.o., [place], [place] female. Her son is bilingual and serves as our Astronomer on today.  Review of chart indicates history of diabetic foot ulcer, but she and her son deny this.   Her concerns today are pain in all toes and a bump on her left foot.  Pain in all toes has been present for "months". Pain is described as tightness and cramping. Present intermittently, with and without shoe gear; weightbearing and nonweightbearing. She does also have bilateral calf pain from time to time.  The "bump" on her left foot has been present for some time. She denies any trauma to area, but shoe gear does irritate it from time to time.  Her son states his mom cuts her own toenails and she relates she has been doing this for years. Both deny any trauma with nail cutting.  Problem List   Cardiovascular and Mediastinum  HYPERTENSION, BENIGN ESSENTIAL   Coronary artery disease   Digestive  GERD   Endocrine  DIABETIC FOOT ULCER   Type 2 diabetes mellitus (HCC)   Musculoskeletal and Integument  PRURITUS   DRY SKIN   DEGENERATIVE DISC DISEASE, LUMBAR SPINE   Other  HELICOBACTER PYLORI INFECTION   HYPERCHOLESTEROLEMIA   CONSTIPATION   MURMUR   URINARY HESITANCY   Chest pain   S/P CABG x 3    Medical History     Date Unknown Abdominal pain  Date Unknown Chest pain   Date Unknown Diabetes mellitus  Date Unknown GERD (gast roesophageal reflux disease)  Date Unknown Hyperlipidemia  Date Unknown Hypertension   Surgical History    10/05/2011 Coronary artery bypass graft   10/04/2011 Coronary artery bypass graft  10/04/2011 Left heart catheterization with coronary angiogram (N/A)    Medications    amLODipine (NORVASC) 10 MG tablet    aspirin 81 MG tablet    glipiZIDE (GLUCOTROL) 5 MG tablet    hydrochlorothiazide (HYDRODIURIL) 25 MG tablet    ibuprofen (ADVIL,MOTRIN) 600 MG tablet    losartan  (COZAAR) 100 MG tablet    metFORMIN (GLUCOPHAGE) 500 MG tablet    pravastatin (PRAVACHOL) 80 MG tablet    TRADJENTA 5 MG TABS tablet    traMADol (ULTRAM) 50 MG tablet    esomeprazole (NEXIUM) 40 MG capsule    ondansetron (ZOFRAN) 4 MG tablet    Allergies      No Known Allergies   Tobacco History   Smoking Status  Never Smoker  Smokeless Tobacco Status  Never Used   Family History    None   Immunizations/Injections   Hepatitis B6/24/2009   Influenza Whole12/13/2011, 04/23/2009, 05/21/2008   Pneumococcal Polysaccharide-231/25/2010   Rubella6/24/2009   Td6/24/2009    Objective: Vitals:   11/30/17 0944  BP: 122/66  Pulse: 78   Vascular Examination: Capillary refill time <3 seconds x 10 digits Dorsalis pedis pulses absent b/l Posterior tibial pulses absent b/l No digital hair x 10 digits Skin temperature warm to cool b/l  Dermatological Examination: No open wounds, no signs of cutaneous pedal infections b/l Skin thin and atrophic b/l Toenails 1-5 b/l discolored, thick, dystrophic with subungual debris and pain with palpation to nailbeds due to thickness of nails.   Musculoskeletal: Muscle strength 5/5 to all LE muscle groups b/l Palpable exostosis noted dorsal left midfoot. No erythema, no edema, no drainage.  Neurological: Sensation intact with 10 gram monofilament. Vibratory sensation diminished  Bilateral foot xrays: Significant for plantar  calcaneal spurs bilaterally. No fractures, no bone destruction noted b/l   In-Office ABIs performed with results of bilateral PAD right>left  Assessment: High risk diabetic with PAD/rest pain 1.  Painful onychomycosis toenails 1-5 b/l 2.  NIDDM with Peripheral arterial disease with rest pain 3.  Diabetic neuropathy   Plan: 1. Patient chart reviewed, patient examined. 2. Discuss diabetic foot care principles.  3. Discussed results of in-office arterial dopplers. 4. Toenails 1-5 b/l were debrided in length and girth  without iatrogenic bleeding. 5. Patient to continue soft, supportive shoe gear. We will have her evaluated and measured for diabetic shoes with our Pedorthist, [employer].  6. Patient/Son to report any pedal injuries to medical professional. Advised Ms. Fleischer and her son for her to  discontinue trimming her toenails due to her high risk of non-healing. 7. Referred to Vascular Sx for evaluation of PAD. 8. Follow up 3 months. 9. Patient/POA to call should there be a concern in the interim.

## 2017-12-03 ENCOUNTER — Telehealth: Payer: Self-pay | Admitting: *Deleted

## 2017-12-03 NOTE — Telephone Encounter (Signed)
Faxed referral, with addendum clinicals 11/30/2017 to [facility].

## 2017-12-03 NOTE — Telephone Encounter (Signed)
-----   Message from Marzetta Board, MD sent at 12/02/2017  8:10 AM EDT ----- Regarding: Clinical Note Completed Hi Valery!  Ms. Ripp note is complete for referral to Vascular Surgery.  Thanks!  Dr. Darnell Level.

## 2017-12-05 ENCOUNTER — Telehealth: Payer: Self-pay | Admitting: Orthotics

## 2017-12-05 NOTE — Telephone Encounter (Signed)
Called to leave message that [place] are NOT covered by Medicaid and to make aware of expense.

## 2018-03-05 ENCOUNTER — Ambulatory Visit (INDEPENDENT_AMBULATORY_CARE_PROVIDER_SITE_OTHER): Payer: Medicare Other | Admitting: Podiatry

## 2018-03-05 DIAGNOSIS — E1151 Type 2 diabetes mellitus with diabetic peripheral angiopathy without gangrene: Secondary | ICD-10-CM

## 2018-03-05 DIAGNOSIS — M79674 Pain in right toe(s): Secondary | ICD-10-CM

## 2018-03-05 DIAGNOSIS — M79675 Pain in left toe(s): Secondary | ICD-10-CM

## 2018-03-05 DIAGNOSIS — B351 Tinea unguium: Secondary | ICD-10-CM | POA: Diagnosis not present

## 2018-03-05 DIAGNOSIS — L84 Corns and callosities: Secondary | ICD-10-CM

## 2018-03-06 ENCOUNTER — Ambulatory Visit: Payer: Medicare Other | Admitting: Orthotics

## 2018-03-06 DIAGNOSIS — L84 Corns and callosities: Secondary | ICD-10-CM

## 2018-03-06 DIAGNOSIS — B351 Tinea unguium: Secondary | ICD-10-CM

## 2018-03-06 DIAGNOSIS — E1142 Type 2 diabetes mellitus with diabetic polyneuropathy: Secondary | ICD-10-CM

## 2018-03-06 DIAGNOSIS — M79675 Pain in left toe(s): Principal | ICD-10-CM

## 2018-03-06 DIAGNOSIS — M79672 Pain in left foot: Secondary | ICD-10-CM

## 2018-03-06 DIAGNOSIS — E1151 Type 2 diabetes mellitus with diabetic peripheral angiopathy without gangrene: Secondary | ICD-10-CM

## 2018-03-06 DIAGNOSIS — I739 Peripheral vascular disease, unspecified: Secondary | ICD-10-CM

## 2018-03-06 DIAGNOSIS — M79674 Pain in right toe(s): Principal | ICD-10-CM

## 2018-03-06 DIAGNOSIS — M79671 Pain in right foot: Secondary | ICD-10-CM

## 2018-03-17 ENCOUNTER — Encounter: Payer: Self-pay | Admitting: Podiatry

## 2018-03-17 NOTE — Progress Notes (Signed)
Subjective: Amanda [name] presents today for diabetic foot care. Her son, Dr. Gailen [name],  states he received a call from our office informing him her diabetic shoes would not be covered. He is unsure who called him.   Dr. Gailen [name] also states they never received call for Vascular Surgery appointment for lower extremity vascular workup.  [name] relates no new problems on today's visit.  Objective: Vascular Examination: Capillary refill time <3 seconds x 10 digits Dorsalis pedis pulses diminished b/l Posterior tibial pulses absent b/l No digital hair x 10 digits Skin temperature warm to cool b/l  Dermatological Examination: No open wounds b/l Skin thin and atrophic Toenails 1-5 b/l discolored, thick, dystrophic with subungual debris and pain with palpation to nailbeds due to thickness of nails. Hyperkeratotic lesion 4th webspace left foot  Musculoskeletal: Muscle strength 5/5 to all LE muscle groups  Neurological: Sensation intact with 10 gram monofilament. Vibratory sensation diminished  Assessment: 1. Painful onychomycosis toenails 1-5 b/l 2. 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 NIDDM with Peripheral arterial disease 4. Diabetic neuropathy  Plan: 1. Will follow up on Vascular consult for lower extremity workup. She also has h/o CABG in 2013.   2. Confirmed information regarding diabetic shoe coverage with Pedorthist's office and shoes are a covered benefit. 3. Toenails 1-5 b/l were debrided in length and girth without iatrogenic bleeding. 4. Hyperkeratotic lesion pared with sterile scalpel blade and gently smoothed with burr without incident.  5. Patient to continue soft, supportive shoe gear 6. Patient to report any pedal injuries to medical professional  7. Follow up 3 months. Patient/POA to call should there be a concern in the interim.

## 2018-03-18 ENCOUNTER — Telehealth: Payer: Self-pay | Admitting: *Deleted

## 2018-03-18 DIAGNOSIS — M79672 Pain in left foot: Secondary | ICD-10-CM

## 2018-03-18 DIAGNOSIS — I739 Peripheral vascular disease, unspecified: Secondary | ICD-10-CM

## 2018-03-18 DIAGNOSIS — E1151 Type 2 diabetes mellitus with diabetic peripheral angiopathy without gangrene: Secondary | ICD-10-CM

## 2018-03-18 DIAGNOSIS — M79671 Pain in right foot: Secondary | ICD-10-CM

## 2018-03-18 NOTE — Telephone Encounter (Signed)
[facility] - [facility] states she will assist the orders through scheduling, fax new orders and referral to her. Faxed orders and referrals to [facility].

## 2018-03-18 NOTE — Telephone Encounter (Signed)
-----   Message from Marzetta Board, MD sent at 03/17/2018 10:45 AM EST ----- Regarding: Never received appointment from Vascular Hi Val!  Patient's son states they never received a call from Vascular for outpatient appointment for lower extremity workup. She also has h/o CABG and hasn't seen Cardiology since 2013 from what I can see in her record.  Also has no PCP from what I can see, so perhaps we can refer her for that as well?

## 2018-03-18 NOTE — Telephone Encounter (Signed)
[facility], "THIS [place] MEDICAID MEMBER DOES NOT REQUIRE PRIOR AUTHORIZATION FOR OUTPATIENT RADIOLOGY THROUGH [facility] OR [facility] AT THIS TIME."

## 2018-03-19 ENCOUNTER — Other Ambulatory Visit: Payer: Self-pay | Admitting: Podiatry

## 2018-03-19 DIAGNOSIS — M79672 Pain in left foot: Secondary | ICD-10-CM

## 2018-03-19 DIAGNOSIS — I739 Peripheral vascular disease, unspecified: Secondary | ICD-10-CM

## 2018-03-19 DIAGNOSIS — M79671 Pain in right foot: Secondary | ICD-10-CM

## 2018-03-19 DIAGNOSIS — E1151 Type 2 diabetes mellitus with diabetic peripheral angiopathy without gangrene: Secondary | ICD-10-CM

## 2018-03-22 ENCOUNTER — Ambulatory Visit (HOSPITAL_COMMUNITY): Payer: Medicare Other

## 2018-03-22 NOTE — Progress Notes (Signed)

## 2018-03-26 ENCOUNTER — Ambulatory Visit: Payer: Medicare Other | Admitting: Cardiovascular Disease

## 2018-03-29 ENCOUNTER — Ambulatory Visit (HOSPITAL_COMMUNITY)
Admission: RE | Admit: 2018-03-29 | Discharge: 2018-03-29 | Disposition: A | Discharge status: Home / Self Care | Payer: Medicare Other | Source: Ambulatory Visit | Attending: Cardiology | Admitting: Cardiology

## 2018-03-29 DIAGNOSIS — M79671 Pain in right foot: Secondary | ICD-10-CM | POA: Diagnosis not present

## 2018-03-29 DIAGNOSIS — M79672 Pain in left foot: Secondary | ICD-10-CM | POA: Diagnosis present

## 2018-03-29 DIAGNOSIS — I739 Peripheral vascular disease, unspecified: Secondary | ICD-10-CM

## 2018-03-29 DIAGNOSIS — E1151 Type 2 diabetes mellitus with diabetic peripheral angiopathy without gangrene: Secondary | ICD-10-CM | POA: Diagnosis present

## 2018-04-01 ENCOUNTER — Telehealth: Payer: Self-pay | Admitting: *Deleted

## 2018-04-01 NOTE — Telephone Encounter (Signed)
I informed Dr. [name] of Dr. Heber Roselle request and reminded of the 04/02/2018 9:20am appt. Faxed the results to [facility].

## 2018-04-01 NOTE — Telephone Encounter (Signed)
-----   Message from Marzetta Board, DPM sent at 03/29/2018  6:52 PM EST ----- Regarding: Please refer to Vascular Surgery for Consultation Hi Val!  Please call patient's son and  forward results of ABI/TBI to Vascular Surgery for further workup. Son is bilingual and speaks [place].   Thank you so much!

## 2018-04-02 ENCOUNTER — Ambulatory Visit (INDEPENDENT_AMBULATORY_CARE_PROVIDER_SITE_OTHER): Payer: Medicare Other | Admitting: Cardiovascular Disease

## 2018-04-02 ENCOUNTER — Encounter: Payer: Self-pay | Admitting: Cardiovascular Disease

## 2018-04-02 VITALS — BP 118/62 | HR 76 | Ht <= 58 in | Wt 119.6 lb

## 2018-04-02 DIAGNOSIS — I739 Peripheral vascular disease, unspecified: Secondary | ICD-10-CM | POA: Diagnosis not present

## 2018-04-02 DIAGNOSIS — I2510 Atherosclerotic heart disease of native coronary artery without angina pectoris: Secondary | ICD-10-CM

## 2018-04-02 DIAGNOSIS — E785 Hyperlipidemia, unspecified: Secondary | ICD-10-CM

## 2018-04-02 DIAGNOSIS — I10 Essential (primary) hypertension: Secondary | ICD-10-CM

## 2018-04-02 MED ORDER — CILOSTAZOL 50 MG PO TABS: 50.0000 mg | ORAL_TABLET | Freq: Two times a day (BID) | ORAL | 1 refills | Status: DC

## 2018-04-02 NOTE — Patient Instructions (Signed)
Medication Instructions:  START cilostazol (Pletal) 50 mg (1 tablet) two times daily  If you need a refill on your cardiac medications before your next appointment, please call your pharmacy.   Follow-Up: At [facility], you and your health needs are our priority.  As part of our continuing mission to provide you with exceptional heart care, we have created designated Provider Care Teams.  These Care Teams include your primary Cardiologist (physician) and Advanced Practice Providers (APPs -  Physician Assistants and Nurse Practitioners) who all work together to provide you with the care you need, when you need it. . 2 months with Dr. Fletcher Anon

## 2018-04-02 NOTE — Progress Notes (Signed)
Cardiology Office Note   Date:  04/02/2018   ID:  Amanda [name], DOB 19-Jun-1945, MRN [id number]  PCP:  [name], MD  Cardiologist:   Kathlyn Sacramento, MD   Chief Complaint  Patient presents with  . New Patient (Initial Visit)      History of Present Illness: Amanda [name] is a 72 y.o. female who was referred by Dr. Adah Perl for evaluation and management of peripheral arterial disease. She has known history of coronary artery disease status post CABG in 2013, type 2 diabetes, hypertension and hyperlipidemia.  She is originally from [place] and does not speak [place].  Her son is a previous professor at [employer] and he helps with interpretation. The patient reports significant bilateral foot discomfort with minimal walking.  In addition, she has significant left foot discomfort at rest mostly at night.  The symptoms started early this year and due to that the patient has cut down her activities slowly.  She has no ulceration.  She denies any chest pain or significant dyspnea but again she does not exert herself due to leg pain.  She takes her medications regularly.  Her symptoms are worse on the left side.  She underwent noninvasive vascular evaluation which showed an ABI of 0.6 on the right and 0.56 on the left. Duplex showed short occlusion and below the knee popliteal artery on the right side with occluded tibial peroneal vessels.  On the left, there was significant disease and above-the-knee popliteal artery with occluded below the knee vessels.   Past Medical History:  Diagnosis Date  . Abdominal pain   . Chest pain    Atypical  . Diabetes mellitus   . GERD (gastroesophageal reflux disease)   . Hyperlipidemia   . Hypertension     Past Surgical History:  Procedure Laterality Date  . 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 CORONARY ARTERY BYPASS GRAFT  10/05/2011   Procedure: CORONARY ARTERY BYPASS GRAFTING (CABG);  Surgeon: Ivin Poot, MD;   Location: [facility];  Service: Open Heart Surgery;  Laterality: N/A;  TEE  . LEFT HEART CATHETERIZATION WITH CORONARY ANGIOGRAM N/A 10/04/2011   Procedure: LEFT HEART CATHETERIZATION WITH CORONARY ANGIOGRAM;  Surgeon: Sherren Mocha, MD;  Location: [facility] CATH LAB;  Service: Cardiovascular;  Laterality: N/A;     Current Outpatient Medications  Medication Sig Dispense Refill  . amLODipine (NORVASC) 10 MG tablet TK 1 T PO QD  1  . aspirin 81 MG tablet Take 81 mg by mouth daily.    Marland Kitchen gabapentin (NEURONTIN) 100 MG capsule Take 1 capsule by mouth at bedtime.    Marland Kitchen glipiZIDE (GLUCOTROL) 10 MG tablet Take 1 tablet by mouth daily.  1  . hydrochlorothiazide (HYDRODIURIL) 25 MG tablet Take 25 mg by mouth daily.    Marland Kitchen ibuprofen (ADVIL,MOTRIN) 600 MG tablet Take 600 mg by mouth every 6 (six) hours as needed.    Marland Kitchen losartan (COZAAR) 100 MG tablet Take 100 mg by mouth daily.    . metFORMIN (GLUCOPHAGE) 500 MG tablet Take 500 mg by mouth 2 (two) times daily with a meal.    . pravastatin (PRAVACHOL) 80 MG tablet Take 80 mg by mouth daily.    . TRADJENTA 5 MG TABS tablet TK 1 T PO  D  1  . traMADol (ULTRAM) 50 MG tablet Take 1 tablet (50 mg total) by mouth every 6 (six) hours as needed. 30 tablet 0   No current facility-administered medications for this visit.  Allergies:   Patient has no known allergies.    Social History:  The patient  reports that she has never smoked. She has never used smokeless tobacco. She reports that she does not drink alcohol or use drugs.   Family History:  The patient's family history is not known by the patient or son.   ROS:  Please see the history of present illness.   Otherwise, review of systems are positive for none.   All other systems are reviewed and negative.    PHYSICAL EXAM: VS:  Ht 4\' 7"  (1.397 m)   BMI 25.13 kg/m  , BMI Body mass index is 25.13 kg/m. GEN: Well nourished, well developed, in no acute distress  HEENT: normal  Neck: no JVD, carotid bruits, or  masses Cardiac: RRR; no murmurs, rubs, or gallops,no edema  Respiratory:  clear to auscultation bilaterally, normal work of breathing GI: soft, nontender, nondistended, + BS MS: no deformity or atrophy  Skin: warm and dry, no rash Neuro:  Strength and sensation are intact Psych: euthymic mood, full affect Vascular: Radial pulses normal bilaterally.  Femoral pulses +1 bilaterally.  Distal pulses are nonpalpable.   EKG:  EKG is ordered today. The ekg ordered today demonstrates normal sinus rhythm with possible left atrial enlargement and possible old anterior infarct.   Recent Labs: No results found for requested labs within last 8760 hours.    Lipid Panel    Component Value Date/Time   CHOL 197 01/24/2010 2316   TRIG 143 01/24/2010 2316   HDL 42 01/24/2010 2316   CHOLHDL 4.7 Ratio 01/24/2010 2316   VLDL 29 01/24/2010 2316   LDLCALC 126 (H) 01/24/2010 2316      Wt Readings from Last 3 Encounters:  01/29/12 108 lb 1.9 oz (49 kg)  01/05/12 108 lb 8 oz (49.2 kg)  11/22/11 106 lb (48.1 kg)      No flowsheet data found.    ASSESSMENT AND PLAN:  1.  Peripheral arterial disease with severe rest pain worse on the left side.  The patient seems to have extensive popliteal and below the knee disease.  She is at high risk for ischemic ulceration and limb loss.  I discussed this extensively with the patient and her son.  I have suggested proceeded with angiography and possible endovascular intervention.  However, the patient is very hesitant to undergo any invasive procedures and wants to try medications before proceeding.  I elected to start on small dose cilostazol 50 mg twice daily.  I encouraged her to increase her walking.  Reevaluate symptoms in 2 months.  2.  Coronary artery disease involving native coronary arteries without angina: She has no anginal symptoms but she does not exert herself much.  3.  Essential hypertension: Blood pressures well controlled on current  medications.  4.  Hyperlipidemia: Currently on pravastatin with no recent lipid profile.  We should consider a more potent statin to achieve an LDL below 70.    Disposition:   FU with me in 2 months  Signed,  Kathlyn Sacramento, MD  04/02/2018 9:45 AM    [place]

## 2018-04-25 ENCOUNTER — Ambulatory Visit (INDEPENDENT_AMBULATORY_CARE_PROVIDER_SITE_OTHER): Payer: Medicare Other | Admitting: Orthotics

## 2018-04-25 DIAGNOSIS — M79672 Pain in left foot: Secondary | ICD-10-CM | POA: Diagnosis not present

## 2018-04-25 DIAGNOSIS — M79671 Pain in right foot: Secondary | ICD-10-CM

## 2018-04-25 DIAGNOSIS — L84 Corns and callosities: Secondary | ICD-10-CM

## 2018-04-25 DIAGNOSIS — E1151 Type 2 diabetes mellitus with diabetic peripheral angiopathy without gangrene: Secondary | ICD-10-CM | POA: Diagnosis not present

## 2018-04-25 DIAGNOSIS — I739 Peripheral vascular disease, unspecified: Secondary | ICD-10-CM

## 2018-04-25 NOTE — Progress Notes (Signed)

## 2018-05-28 ENCOUNTER — Ambulatory Visit: Payer: Medicare Other | Admitting: Cardiovascular Disease

## 2018-06-04 ENCOUNTER — Ambulatory Visit: Payer: Medicare Other | Admitting: Podiatry

## 2018-06-18 ENCOUNTER — Encounter: Payer: Self-pay | Admitting: Cardiovascular Disease

## 2018-06-18 ENCOUNTER — Ambulatory Visit (INDEPENDENT_AMBULATORY_CARE_PROVIDER_SITE_OTHER): Payer: Medicare Other | Admitting: Cardiovascular Disease

## 2018-06-18 VITALS — BP 128/62 | HR 92 | Ht <= 58 in | Wt 118.6 lb

## 2018-06-18 DIAGNOSIS — I10 Essential (primary) hypertension: Secondary | ICD-10-CM | POA: Diagnosis not present

## 2018-06-18 DIAGNOSIS — I2510 Atherosclerotic heart disease of native coronary artery without angina pectoris: Secondary | ICD-10-CM

## 2018-06-18 DIAGNOSIS — I739 Peripheral vascular disease, unspecified: Secondary | ICD-10-CM

## 2018-06-18 DIAGNOSIS — E785 Hyperlipidemia, unspecified: Secondary | ICD-10-CM | POA: Diagnosis not present

## 2018-06-18 MED ORDER — CILOSTAZOL 100 MG PO TABS: 100.0000 mg | ORAL_TABLET | Freq: Two times a day (BID) | ORAL | 1 refills | Status: DC

## 2018-06-18 NOTE — Progress Notes (Signed)
Cardiology Office Note   Date:  06/18/2018   ID:  Amanda [name], DOB 07-Sep-1945, MRN [id number]  PCP:  [name], MD  Cardiologist:   Kathlyn Sacramento, MD   No chief complaint on file.     History of Present Illness: Amanda [name] is a 73 y.o. female who is here today for follow-up visit regarding peripheral arterial disease. She has known history of coronary artery disease status post CABG in 2013, type 2 diabetes, hypertension and hyperlipidemia.  She is originally from [place] and does not speak [place].  Her son is a previous professor at [employer] and he helps with interpretation.  She was seen recently for bilateral foot discomfort with minimal walking.  In addition, she has significant left foot discomfort at rest mostly at night.  No ulceration was noted.    Her symptoms are worse on the left side.  She underwent noninvasive vascular evaluation which showed an ABI of 0.6 on the right and 0.56 on the left. Duplex showed short occlusion of below the knee popliteal artery on the right side with occluded tibial peroneal vessels.  On the left, there was significant disease affecting above-the-knee popliteal artery with occluded below the knee vessels.  I advised the patient to undergo an angiogram but she declined.  She was started on small dose cilostazol and she reports improvement in her symptoms.  No chest pain or shortness of breath. She is mostly concerned today about a swollen toe under the nail bed.   Past Medical History:  Diagnosis Date  . Abdominal pain   . Chest pain    Atypical  . Diabetes mellitus   . GERD (gastroesophageal reflux disease)   . Hyperlipidemia   . Hypertension     Past Surgical History:  Procedure Laterality Date  . 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 CORONARY ARTERY BYPASS GRAFT  10/05/2011   Procedure: CORONARY ARTERY BYPASS GRAFTING (CABG);  Surgeon: Ivin Poot, MD;  Location: [place];  Service: Open Heart Surgery;   Laterality: N/A;  TEE  . LEFT HEART CATHETERIZATION WITH CORONARY ANGIOGRAM N/A 10/04/2011   Procedure: LEFT HEART CATHETERIZATION WITH CORONARY ANGIOGRAM;  Surgeon: Sherren Mocha, MD;  Location: [facility] CATH LAB;  Service: Cardiovascular;  Laterality: N/A;     Current Outpatient Medications  Medication Sig Dispense Refill  . amLODipine (NORVASC) 10 MG tablet Take 10 mg by mouth daily.   1  . aspirin 81 MG tablet Take 81 mg by mouth daily.    . cilostazol (PLETAL) 100 MG tablet Take 1 tablet (100 mg total) by mouth 2 (two) times daily. 180 tablet 1  . gabapentin (NEURONTIN) 100 MG capsule Take 1 capsule by mouth at bedtime.    Marland Kitchen glipiZIDE (GLUCOTROL) 10 MG tablet Take 1 tablet by mouth daily.  1  . hydrochlorothiazide (HYDRODIURIL) 25 MG tablet Take 25 mg by mouth daily.    Marland Kitchen ibuprofen (ADVIL,MOTRIN) 600 MG tablet Take 600 mg by mouth every 6 (six) hours as needed.    Marland Kitchen losartan (COZAAR) 100 MG tablet Take 100 mg by mouth daily.    . metFORMIN (GLUCOPHAGE) 500 MG tablet Take 500 mg by mouth 2 (two) times daily with a meal.    . pravastatin (PRAVACHOL) 80 MG tablet Take 80 mg by mouth daily.    . TRADJENTA 5 MG TABS tablet TK 1 T PO  D  1  . traMADol (ULTRAM) 50 MG tablet Take 1 tablet (50 mg total) by mouth  every 6 (six) hours as needed. 30 tablet 0   No current facility-administered medications for this visit.     Allergies:   Patient has no known allergies.    Social History:  The patient  reports that she has never smoked. She has never used smokeless tobacco. She reports that she does not drink alcohol or use drugs.   Family History:  The patient's family history is not known by the patient or son.   ROS:  Please see the history of present illness.   Otherwise, review of systems are positive for none.   All other systems are reviewed and negative.    PHYSICAL EXAM: VS:  BP 128/62   Pulse 92   Ht 4\' 7"  (1.397 m)   Wt 118 lb 9.6 oz (53.8 kg)   SpO2 97%   BMI 27.57 kg/m  , BMI  Body mass index is 27.57 kg/m. GEN: Well nourished, well developed, in no acute distress  HEENT: normal  Neck: no JVD, carotid bruits, or masses Cardiac: RRR; no murmurs, rubs, or gallops,no edema  Respiratory:  clear to auscultation bilaterally, normal work of breathing GI: soft, nontender, nondistended, + BS MS: no deformity or atrophy  Skin: warm and dry, no rash Neuro:  Strength and sensation are intact Psych: euthymic mood, full affect Vascular: Radial pulses normal bilaterally.  Femoral pulses +1 bilaterally.  Distal pulses are nonpalpable.   EKG:  EKG is not ordered today.  Recent Labs: No results found for requested labs within last 8760 hours.    Lipid Panel    Component Value Date/Time   CHOL 197 01/24/2010 2316   TRIG 143 01/24/2010 2316   HDL 42 01/24/2010 2316   CHOLHDL 4.7 Ratio 01/24/2010 2316   VLDL 29 01/24/2010 2316   LDLCALC 126 (H) 01/24/2010 2316      Wt Readings from Last 3 Encounters:  06/18/18 118 lb 9.6 oz (53.8 kg)  04/02/18 119 lb 9.6 oz (54.3 kg)  01/29/12 108 lb 1.9 oz (49 kg)      No flowsheet data found.    ASSESSMENT AND PLAN:  1.  Peripheral arterial disease with severe rest pain worse on the left side.  The patient reports improved symptoms with cilostazol and still she does not want any invasive procedures at present time.  I elected to increase cilostazol to 100 mg twice daily.  Continue aggressive treatment of risk factors.  2.  Coronary artery disease involving native coronary arteries without angina: She has no anginal symptoms but she does not exert herself much.  3.  Essential hypertension: Blood pressures well controlled on current medications.  4.  Hyperlipidemia: Currently on pravastatin with no recent lipid profile.  We should consider a more potent statin to achieve an LDL below 70.    Disposition:   FU with me in 6 months  Signed,  Kathlyn Sacramento, MD  06/18/2018 [place]

## 2018-06-18 NOTE — Patient Instructions (Signed)
Medication Instructions:  INCREASE the Cilostazol (Pletal) to 100 mg twice daily  If you need a refill on your cardiac medications before your next appointment, please call your pharmacy.   Lab work: None ordered  Testing/Procedures: None ordered  Follow-Up: At [facility], you and your health needs are our priority.  As part of our continuing mission to provide you with exceptional heart care, we have created designated Provider Care Teams.  These Care Teams include your primary Cardiologist (physician) and Advanced Practice Providers (APPs -  Physician Assistants and Nurse Practitioners) who all work together to provide you with the care you need, when you need it. You will need a follow up appointment in 6 months.  Please call our office 2 months in advance to schedule this appointment.  You may see Dr. Fletcher Anon or one of the following Advanced Practice Providers on your designated Care Team:   Kerin Ransom, PA-C Roby Lofts, [place] . Sande Rives, PA-C

## 2018-06-22 ENCOUNTER — Ambulatory Visit (HOSPITAL_COMMUNITY)
Admission: EM | Admit: 2018-06-22 | Discharge: 2018-06-22 | Disposition: A | Discharge status: Home / Self Care | Payer: Medicare Other | Attending: Internal Medicine | Admitting: Internal Medicine

## 2018-06-22 ENCOUNTER — Encounter (HOSPITAL_COMMUNITY): Payer: Self-pay | Admitting: *Deleted

## 2018-06-22 DIAGNOSIS — L03011 Cellulitis of right finger: Secondary | ICD-10-CM

## 2018-06-22 MED ORDER — MUPIROCIN 2 % EX OINT: 1.0000 "application " | TOPICAL_OINTMENT | Freq: Two times a day (BID) | CUTANEOUS | 0 refills | Status: DC

## 2018-06-22 MED ORDER — IBUPROFEN 400 MG PO TABS: 400.0000 mg | ORAL_TABLET | Freq: Four times a day (QID) | ORAL | 0 refills | Status: DC | PRN

## 2018-06-22 MED ORDER — CEPHALEXIN 500 MG PO CAPS: 500.0000 mg | ORAL_CAPSULE | Freq: Four times a day (QID) | ORAL | 0 refills | Status: DC

## 2018-06-22 NOTE — ED Triage Notes (Signed)
Reports right 4th finger pain, swelling x 1 wk.  Purulence noted surrounding nailbed.

## 2018-06-22 NOTE — Discharge Instructions (Signed)
Begin Keflex 4 times a day for 5 days Apply bactroban twice daily after soaking in warm water for 10-15 min Please read attached information about this  Use anti-inflammatories for pain/swelling. You may take up to 400 mg Ibuprofen every 8 hours with food. You may supplement Ibuprofen with Tylenol [phone] mg every 8 hours.   Please follow up if symptoms worsening, not improving with medicine or moving up arm/to tip of finger

## 2018-06-23 NOTE — ED Provider Notes (Signed)
[place]    CSN: [id number] Arrival date & time: 06/22/18  1005     History   Chief Complaint Chief Complaint  Patient presents with  . Hand Pain    HPI Amanda [name] is a 73 y.o. female history of hypertension, hyperlipidemia, CAD status post CABG, DM type II presenting today for evaluation of right fourth finger pain and swelling.  She has noticed a area of pus.  This is been going on for approximately 1 week.  Denies history of similar in the past.  She is not been taking anything for her pain.  Denies difficulty moving finger.  Denies fevers.  HPI  Past Medical History:  Diagnosis Date  . Abdominal pain   . Chest pain    Atypical  . Diabetes mellitus   . GERD (gastroesophageal reflux disease)   . Hyperlipidemia   . Hypertension     Patient Active Problem List   Diagnosis Date Noted  . 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 Coronary artery disease 10/04/2011  . Type 2 diabetes mellitus ([place]) 10/04/2011  . Chest pain 09/25/2011  . HELICOBACTER PYLORI INFECTION 01/25/2009  . DIABETIC FOOT ULCER 01/22/2009  . GERD 12/30/2008  . MURMUR 11/26/2008  . URINARY HESITANCY 11/26/2008  . HYPERCHOLESTEROLEMIA 09/14/2008  . HYPERTENSION, BENIGN ESSENTIAL 09/14/2008  . CONSTIPATION 06/08/2008  . PRURITUS 05/21/2008  . DRY SKIN 05/21/2008  . 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 CARDIAC SURGERY    . 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 CORONARY ARTERY BYPASS GRAFT  10/05/2011   Procedure: CORONARY ARTERY BYPASS GRAFTING (CABG);  Surgeon: Ivin Poot, MD;  Location: [place];  Service: Open Heart Surgery;  Laterality: N/A;  TEE  . LEFT HEART CATHETERIZATION WITH CORONARY ANGIOGRAM N/A 10/04/2011   Procedure: LEFT HEART CATHETERIZATION WITH CORONARY ANGIOGRAM;  Surgeon: Sherren Mocha, MD;  Location: [facility] CATH LAB;  Service: Cardiovascular;  Laterality: N/A;    OB History   No obstetric  history on file.      Home Medications    Prior to Admission medications   Medication Sig Start Date End Date Taking? Authorizing Provider  amLODipine (NORVASC) 10 MG tablet Take 10 mg by mouth daily.  09/10/17  Yes [provider]  aspirin 81 MG tablet Take 81 mg by mouth daily.   Yes [provider]  cilostazol (PLETAL) 100 MG tablet Take 1 tablet (100 mg total) by mouth 2 (two) times daily. 06/18/18  Yes Wellington Hampshire, MD  gabapentin (NEURONTIN) 100 MG capsule Take 1 capsule by mouth at bedtime. 03/24/18  Yes [provider]  glipiZIDE (GLUCOTROL) 10 MG tablet Take 1 tablet by mouth daily. 03/05/18  Yes [provider]  hydrochlorothiazide (HYDRODIURIL) 25 MG tablet Take 25 mg by mouth daily.   Yes [provider]  losartan (COZAAR) 100 MG tablet Take 100 mg by mouth daily.   Yes [provider]  metFORMIN (GLUCOPHAGE) 500 MG tablet Take 500 mg by mouth 2 (two) times daily with a meal.   Yes [provider]  pravastatin (PRAVACHOL) 80 MG tablet Take 80 mg by mouth daily.   Yes [provider]  TRADJENTA 5 MG TABS tablet TK 1 T PO  D 09/10/17  Yes [provider]  traMADol (ULTRAM) 50 MG tablet Take 1 tablet (50 mg total) by mouth every 6 (six) hours as needed. 01/05/12  Yes Ivin Poot,  MD  cephALEXin (KEFLEX) 500 MG capsule Take 1 capsule (500 mg total) by mouth 4 (four) times daily. 06/22/18   ,  C, PA-C  ibuprofen (ADVIL,MOTRIN) 400 MG tablet Take 1 tablet (400 mg total) by mouth every 6 (six) hours as needed. 06/22/18   ,  C, PA-C  mupirocin ointment (BACTROBAN) 2 % Apply 1 application topically 2 (two) times daily. 06/22/18   , Elesa Hacker, PA-C    Family History Family History  Family history unknown: Yes    Social History Social History   Tobacco Use  . Smoking status: Never Smoker  . Smokeless tobacco: Never Used  Substance Use Topics  . Alcohol use: No  . Drug  use: No     Allergies   Patient has no known allergies.   Review of Systems Review of Systems  Constitutional: Negative for fatigue and fever.  Eyes: Negative for visual disturbance.  Respiratory: Negative for shortness of breath.   Cardiovascular: Negative for chest pain.  Gastrointestinal: Negative for abdominal pain, nausea and vomiting.  Musculoskeletal: Positive for joint swelling. Negative for arthralgias.  Skin: Positive for color change. Negative for rash and wound.  Neurological: Negative for dizziness, weakness, light-headedness and headaches.     Physical Exam Triage Vital Signs ED Triage Vitals  Enc Vitals Group     BP 06/22/18 1028 118/74     Pulse Rate 06/22/18 1028 92     Resp 06/22/18 1028 16     Temp 06/22/18 1028 98.1 F (36.7 C)     Temp src --      SpO2 06/22/18 1028 96 %     Weight --      Height --      Head Circumference --      Peak Flow --      Pain Score 06/22/18 1029 9     Pain Loc --      Pain Edu? --      Excl. in [place]? --    No data found.  Updated Vital Signs BP 118/74   Pulse 92   Temp 98.1 F (36.7 C)   Resp 16   SpO2 96%   Visual Acuity Right Eye Distance:   Left Eye Distance:   Bilateral Distance:    Right Eye Near:   Left Eye Near:    Bilateral Near:     Physical Exam Vitals signs and nursing note reviewed.  Constitutional:      Appearance: She is well-developed.     Comments: No acute distress  HENT:     Head: Normocephalic and atraumatic.     Nose: Nose normal.  Eyes:     Conjunctiva/sclera: Conjunctivae normal.  Neck:     Musculoskeletal: Neck supple.  Cardiovascular:     Rate and Rhythm: Normal rate.  Pulmonary:     Effort: Pulmonary effort is normal. No respiratory distress.  Abdominal:     General: There is no distension.  Musculoskeletal: Normal range of motion.  Skin:    General: Skin is warm and dry.     Comments: Right fourth finger with erythema swelling and area of pus around nailbed,  pustular pocket on lateral nail fold extending minimally to distal pulp, but distal pulp largely nontender  Neurological:     Mental Status: She is alert and oriented to person, place, and time.      UC Treatments / Results  Labs (all labs ordered are listed, but only abnormal results are displayed) Labs Reviewed -  No data to display  EKG None  Radiology No results found.  Procedures Incision and Drainage Date/Time: 06/22/2018 10:44 AM Performed by: , Elesa Hacker, PA-C Authorized by: Harrie Foreman, MD   Consent:    Consent obtained:  Verbal   Consent given by:  Patient and spouse   Risks discussed:  Pain, incomplete drainage and bleeding   Alternatives discussed:  No treatment Location:    Type:  Abscess (Paronychia)   Location:  Upper extremity   Upper extremity location:  Finger   Finger location:  R ring finger Pre-procedure details:    Procedure prep: Alcohol. Anesthesia (see MAR for exact dosages):    Anesthesia method:  None Procedure type:    Complexity:  Simple Procedure details:    Incision types:  Single straight   Incision depth:  Dermal   Scalpel blade:  11   Drainage:  Purulent and bloody   Drainage amount: mild.   Packing materials:  None Post-procedure details:    Patient tolerance of procedure:  Tolerated well, no immediate complications   (including critical care time)  Medications Ordered in UC Medications - No data to display  Initial Impression / Assessment and Plan / UC Course  I have reviewed the triage vital signs and the nursing notes.  Pertinent labs & imaging results that were available during my care of the patient were reviewed by me and considered in my medical decision making (see chart for details).     Paronychia drained, will initiate on Keflex over the next 5 days, Bactroban topically after soaking.  Continue to monitor for resolution of symptoms, not suggestive of felon at this time, but advised if symptoms moving  to tip of finger to go to emergency room.  Anti-inflammatories for pain.Discussed strict return precautions. Patient verbalized understanding and is agreeable with plan.  Final Clinical Impressions(s) / UC Diagnoses   Final diagnoses:  Paronychia of right ring finger     Discharge Instructions     Begin Keflex 4 times a day for 5 days Apply bactroban twice daily after soaking in warm water for 10-15 min Please read attached information about this  Use anti-inflammatories for pain/swelling. You may take up to 400 mg Ibuprofen every 8 hours with food. You may supplement Ibuprofen with Tylenol [phone] mg every 8 hours.   Please follow up if symptoms worsening, not improving with medicine or moving up arm/to tip of finger   ED Prescriptions    Medication Sig Dispense Auth. Provider   cephALEXin (KEFLEX) 500 MG capsule Take 1 capsule (500 mg total) by mouth 4 (four) times daily. 20 capsule ,  C, PA-C   mupirocin ointment (BACTROBAN) 2 % Apply 1 application topically 2 (two) times daily. 22 g ,  C, PA-C   ibuprofen (ADVIL,MOTRIN) 400 MG tablet Take 1 tablet (400 mg total) by mouth every 6 (six) hours as needed. 30 tablet , [place] C, PA-C     Controlled Substance Prescriptions [place] Controlled Substance Registry consulted? Not Applicable   Janith Lima, [place] 06/23/18 [phone]

## 2018-07-03 ENCOUNTER — Ambulatory Visit (INDEPENDENT_AMBULATORY_CARE_PROVIDER_SITE_OTHER): Payer: Medicare Other | Admitting: Podiatry

## 2018-07-03 ENCOUNTER — Other Ambulatory Visit: Payer: Self-pay

## 2018-07-03 DIAGNOSIS — M79674 Pain in right toe(s): Secondary | ICD-10-CM | POA: Diagnosis not present

## 2018-07-03 DIAGNOSIS — M79675 Pain in left toe(s): Secondary | ICD-10-CM | POA: Diagnosis not present

## 2018-07-03 DIAGNOSIS — E1142 Type 2 diabetes mellitus with diabetic polyneuropathy: Secondary | ICD-10-CM

## 2018-07-03 DIAGNOSIS — E1151 Type 2 diabetes mellitus with diabetic peripheral angiopathy without gangrene: Secondary | ICD-10-CM

## 2018-07-03 DIAGNOSIS — B351 Tinea unguium: Secondary | ICD-10-CM

## 2018-07-03 NOTE — Patient Instructions (Signed)

## 2018-07-12 ENCOUNTER — Encounter: Payer: Self-pay | Admitting: Podiatry

## 2018-07-12 NOTE — Progress Notes (Signed)
Subjective: Amanda [name] is a 73 y.o. y.o. female who presents for preventative foot care today with diabetes and PAD.    She is seen for painful, mycotic toenails. Pain is aggravated when wearing enclosed shoe gear. Pain is relieved with periodic professional debridement.  Amanda Ebbs, MD is her PCP.  She did have Vascular evaluation. An angiogram was recommended by Dr. Fletcher Amanda [name], but patient declines at this time.   Current Outpatient Medications:  .  amLODipine (NORVASC) 10 MG tablet, Take 10 mg by mouth daily. , Disp: , Rfl: 1 .  aspirin 81 MG tablet, Take 81 mg by mouth daily., Disp: , Rfl:  .  cephALEXin (KEFLEX) 500 MG capsule, Take 1 capsule (500 mg total) by mouth 4 (four) times daily., Disp: 20 capsule, Rfl: 0 .  cilostazol (PLETAL) 100 MG tablet, Take 1 tablet (100 mg total) by mouth 2 (two) times daily., Disp: 180 tablet, Rfl: 1 .  gabapentin (NEURONTIN) 100 MG capsule, Take 1 capsule by mouth at bedtime., Disp: , Rfl:  .  glipiZIDE (GLUCOTROL) 10 MG tablet, Take 1 tablet by mouth daily., Disp: , Rfl: 1 .  hydrochlorothiazide (HYDRODIURIL) 25 MG tablet, Take 25 mg by mouth daily., Disp: , Rfl:  .  ibuprofen (ADVIL,MOTRIN) 400 MG tablet, Take 1 tablet (400 mg total) by mouth every 6 (six) hours as needed., Disp: 30 tablet, Rfl: 0 .  losartan (COZAAR) 100 MG tablet, Take 100 mg by mouth daily., Disp: , Rfl:  .  metFORMIN (GLUCOPHAGE) 500 MG tablet, Take 500 mg by mouth 2 (two) times daily with a meal., Disp: , Rfl:  .  mupirocin ointment (BACTROBAN) 2 %, Apply 1 application topically 2 (two) times daily., Disp: 22 g, Rfl: 0 .  pravastatin (PRAVACHOL) 80 MG tablet, Take 80 mg by mouth daily., Disp: , Rfl:  .  TRADJENTA 5 MG TABS tablet, TK 1 T PO  D, Disp: , Rfl: 1 .  traMADol (ULTRAM) 50 MG tablet, Take 1 tablet (50 mg total) by mouth every 6 (six) hours as needed., Disp: 30 tablet, Rfl: 0  No Known Allergies  Objective: Vascular Examination: Capillary refill time <3 seconds  x 10 digits  Dorsalis pedis pulses diminished b/l  Posterior tibial pulses absent b/l  No digital hair x 10 digits  Skin temperature warm to cool b/l  Dermatological Examination: Skin thin, shiny and atrophic b/l  Toenails 1-5 b/l discolored, thick, dystrophic with subungual debris and pain with palpation to nailbeds due to thickness of nails.  Musculoskeletal: Muscle strength 5/5 to all LE muscle groups  Neurological: Sensation intact with 10 gram monofilament.   Assessment: 1. Painful onychomycosis toenails 1-5 b/l 2. NIDDM with PAD 3. Diabetic neuropathy  Plan: 1. Discussed importance of angiogram with son and patient. Patient still declines to have angiogram performed. 2. Discuss diabetic foot care principles. Literature dispensed. 3. Toenails 1-5 b/l were debrided in length and girth without iatrogenic bleeding. 4. Patient to continue soft, supportive shoe gear. 5. Patient to report any pedal injuries to medical professional  6. Follow up 3 months.  7. Patient/POA to call should there be a concern in the interim.

## 2018-10-02 ENCOUNTER — Ambulatory Visit: Payer: Medicare Other | Admitting: Podiatry

## 2018-11-17 ENCOUNTER — Other Ambulatory Visit: Payer: Self-pay | Admitting: Cardiovascular Disease

## 2018-11-18 NOTE — Telephone Encounter (Signed)
Please review for refill. Thanks!  

## 2018-11-18 NOTE — Telephone Encounter (Signed)
Rx(s) sent to pharmacy electronically.  

## 2019-06-12 ENCOUNTER — Telehealth: Payer: Self-pay | Admitting: *Deleted

## 2019-06-12 NOTE — Telephone Encounter (Signed)
A message was left, re: her follow up visit with Dr.Arida.

## 2019-07-11 ENCOUNTER — Ambulatory Visit: Payer: Medicare Other | Attending: Internal Medicine

## 2019-07-11 DIAGNOSIS — Z23 Encounter for immunization: Secondary | ICD-10-CM | POA: Insufficient documentation

## 2019-07-11 NOTE — Progress Notes (Signed)
   Covid-19 Vaccination Clinic  Name:  Amanda [name]    MRN: [id number] DOB: 1946-04-19  07/11/2019  [name] was observed post Covid-19 immunization for 15 minutes without incidence. She was provided with Vaccine Information Sheet and instruction to access the V-Safe system.   [name] was instructed to call 911 with any severe reactions post vaccine: Marland Kitchen Difficulty breathing  . Swelling of your face and throat  . A fast heartbeat  . A bad rash all over your body  . Dizziness and weakness    Immunizations Administered    Name Date Dose VIS Date Route   Pfizer COVID-19 Vaccine 07/11/2019  8:30 AM 0.3 mL 04/25/2019 Intramuscular   Manufacturer: [place]   Lot: [id number]   [place]: [id number]

## 2019-08-05 ENCOUNTER — Ambulatory Visit: Payer: Medicare Other

## 2019-08-11 ENCOUNTER — Other Ambulatory Visit: Payer: Self-pay | Admitting: Cardiovascular Disease

## 2019-08-11 NOTE — Telephone Encounter (Signed)
Refill Request.  

## 2019-08-14 ENCOUNTER — Ambulatory Visit: Payer: Medicare Other | Attending: Internal Medicine

## 2019-08-14 DIAGNOSIS — Z23 Encounter for immunization: Secondary | ICD-10-CM

## 2019-08-14 NOTE — Progress Notes (Signed)
   Covid-19 Vaccination Clinic  Name:  Shyne Resch    MRN: [id number] DOB: 01-31-1946  08/14/2019  Ms. Cinquemani was observed post Covid-19 immunization for 15 minutes without incident. She was provided with Vaccine Information Sheet and instruction to access the V-Safe system.   Ms. Deem was instructed to call 911 with any severe reactions post vaccine: Marland Kitchen Difficulty breathing  . Swelling of face and throat  . A fast heartbeat  . A bad rash all over body  . Dizziness and weakness   Immunizations Administered    Name Date Dose VIS Date Route   Pfizer COVID-19 Vaccine 08/14/2019  8:36 AM 0.3 mL 04/25/2019 Intramuscular   Manufacturer: [place]   Lot: [id number]   [place]: [id number]

## 2020-02-22 ENCOUNTER — Inpatient Hospital Stay (HOSPITAL_COMMUNITY)
Admission: EM | Admit: 2020-02-22 | Discharge: 2020-02-24 | DRG: 641 | Disposition: A | Discharge status: Home / Self Care | Payer: Medicare Other | Attending: Internal Medicine | Admitting: Internal Medicine

## 2020-02-22 ENCOUNTER — Emergency Department (HOSPITAL_COMMUNITY): Payer: Medicare Other

## 2020-02-22 DIAGNOSIS — J90 Pleural effusion, not elsewhere classified: Secondary | ICD-10-CM | POA: Diagnosis not present

## 2020-02-22 DIAGNOSIS — I129 Hypertensive chronic kidney disease with stage 1 through stage 4 chronic kidney disease, or unspecified chronic kidney disease: Secondary | ICD-10-CM | POA: Diagnosis present

## 2020-02-22 DIAGNOSIS — E119 Type 2 diabetes mellitus without complications: Secondary | ICD-10-CM

## 2020-02-22 DIAGNOSIS — E1122 Type 2 diabetes mellitus with diabetic chronic kidney disease: Secondary | ICD-10-CM | POA: Diagnosis present

## 2020-02-22 DIAGNOSIS — T502X5A Adverse effect of carbonic-anhydrase inhibitors, benzothiadiazides and other diuretics, initial encounter: Secondary | ICD-10-CM | POA: Diagnosis present

## 2020-02-22 DIAGNOSIS — R079 Chest pain, unspecified: Secondary | ICD-10-CM | POA: Diagnosis present

## 2020-02-22 DIAGNOSIS — N1832 Chronic kidney disease, stage 3b: Secondary | ICD-10-CM | POA: Diagnosis present

## 2020-02-22 DIAGNOSIS — Z7982 Long term (current) use of aspirin: Secondary | ICD-10-CM

## 2020-02-22 DIAGNOSIS — I2510 Atherosclerotic heart disease of native coronary artery without angina pectoris: Secondary | ICD-10-CM | POA: Diagnosis present

## 2020-02-22 DIAGNOSIS — Z7984 Long term (current) use of oral hypoglycemic drugs: Secondary | ICD-10-CM | POA: Diagnosis not present

## 2020-02-22 DIAGNOSIS — Z951 Presence of aortocoronary bypass graft: Secondary | ICD-10-CM | POA: Diagnosis not present

## 2020-02-22 DIAGNOSIS — E871 Hypo-osmolality and hyponatremia: Secondary | ICD-10-CM | POA: Diagnosis present

## 2020-02-22 DIAGNOSIS — K047 Periapical abscess without sinus: Secondary | ICD-10-CM | POA: Diagnosis present

## 2020-02-22 DIAGNOSIS — G8918 Other acute postprocedural pain: Secondary | ICD-10-CM

## 2020-02-22 DIAGNOSIS — I340 Nonrheumatic mitral (valve) insufficiency: Secondary | ICD-10-CM | POA: Diagnosis not present

## 2020-02-22 DIAGNOSIS — Z79899 Other long term (current) drug therapy: Secondary | ICD-10-CM

## 2020-02-22 DIAGNOSIS — J9601 Acute respiratory failure with hypoxia: Secondary | ICD-10-CM | POA: Diagnosis not present

## 2020-02-22 DIAGNOSIS — Z79891 Long term (current) use of opiate analgesic: Secondary | ICD-10-CM | POA: Diagnosis not present

## 2020-02-22 DIAGNOSIS — Z20822 Contact with and (suspected) exposure to covid-19: Secondary | ICD-10-CM | POA: Diagnosis present

## 2020-02-22 DIAGNOSIS — I10 Essential (primary) hypertension: Secondary | ICD-10-CM

## 2020-02-22 DIAGNOSIS — K219 Gastro-esophageal reflux disease without esophagitis: Secondary | ICD-10-CM | POA: Diagnosis present

## 2020-02-22 DIAGNOSIS — K0889 Other specified disorders of teeth and supporting structures: Secondary | ICD-10-CM

## 2020-02-22 DIAGNOSIS — E785 Hyperlipidemia, unspecified: Secondary | ICD-10-CM | POA: Diagnosis present

## 2020-02-22 DIAGNOSIS — J189 Pneumonia, unspecified organism: Secondary | ICD-10-CM | POA: Diagnosis not present

## 2020-02-22 DIAGNOSIS — I361 Nonrheumatic tricuspid (valve) insufficiency: Secondary | ICD-10-CM | POA: Diagnosis not present

## 2020-02-22 LAB — URINALYSIS, ROUTINE W REFLEX MICROSCOPIC
Bacteria, UA: NONE SEEN
Bilirubin Urine: NEGATIVE
Glucose, UA: >=500 mg/dL — AB
Ketones, ur: NEGATIVE mg/dL
Leukocytes,Ua: NEGATIVE
Nitrite: NEGATIVE
Protein, ur: NEGATIVE mg/dL
Specific Gravity, Urine: 1.002 — ABNORMAL LOW (ref 1.005–1.030)
pH: 7 (ref 5.0–8.0)

## 2020-02-22 LAB — CBC WITH DIFFERENTIAL/PLATELET
Abs Immature Granulocytes: 0.02 10*3/uL (ref 0.00–0.07)
Basophils Absolute: 0 10*3/uL (ref 0.0–0.1)
Basophils Relative: 0 %
Eosinophils Absolute: 0 10*3/uL (ref 0.0–0.5)
Eosinophils Relative: 0 %
HCT: 32.3 % — ABNORMAL LOW (ref 36.0–46.0)
Hemoglobin: 10.7 g/dL — ABNORMAL LOW (ref 12.0–15.0)
Immature Granulocytes: 0 %
Lymphocytes Relative: 18 %
Lymphs Abs: 1.5 10*3/uL (ref 0.7–4.0)
MCH: 24.9 pg — ABNORMAL LOW (ref 26.0–34.0)
MCHC: 33.1 g/dL (ref 30.0–36.0)
MCV: 75.1 fL — ABNORMAL LOW (ref 80.0–100.0)
Monocytes Absolute: 0.7 10*3/uL (ref 0.1–1.0)
Monocytes Relative: 9 %
Neutro Abs: 6 10*3/uL (ref 1.7–7.7)
Neutrophils Relative %: 73 %
Platelets: 202 10*3/uL (ref 150–400)
RBC: 4.3 MIL/uL (ref 3.87–5.11)
RDW: 12 % (ref 11.5–15.5)
WBC: 8.2 10*3/uL (ref 4.0–10.5)
nRBC: 0 % (ref 0.0–0.2)

## 2020-02-22 LAB — BASIC METABOLIC PANEL
Anion gap: 13 (ref 5–15)
Anion gap: 12 (ref 5–15)
BUN: 17 mg/dL (ref 8–23)
BUN: 15 mg/dL (ref 8–23)
CO2: 22 mmol/L (ref 22–32)
CO2: 23 mmol/L (ref 22–32)
Calcium: 9.6 mg/dL (ref 8.9–10.3)
Calcium: 9.5 mg/dL (ref 8.9–10.3)
Chloride: 86 mmol/L — ABNORMAL LOW (ref 98–111)
Chloride: 91 mmol/L — ABNORMAL LOW (ref 98–111)
Creatinine, Ser: 1.26 mg/dL — ABNORMAL HIGH (ref 0.44–1.00)
Creatinine, Ser: 1.17 mg/dL — ABNORMAL HIGH (ref 0.44–1.00)
GFR, Estimated: 42 mL/min — ABNORMAL LOW (ref >60)
GFR, Estimated: 46 mL/min — ABNORMAL LOW (ref >60)
Glucose, Bld: 302 mg/dL — ABNORMAL HIGH (ref 70–99)
Glucose, Bld: 248 mg/dL — ABNORMAL HIGH (ref 70–99)
Potassium: 5 mmol/L (ref 3.5–5.1)
Potassium: 4.4 mmol/L (ref 3.5–5.1)
Sodium: 121 mmol/L — ABNORMAL LOW (ref 135–145)
Sodium: 126 mmol/L — ABNORMAL LOW (ref 135–145)

## 2020-02-22 LAB — CBC
HCT: 35.1 % — ABNORMAL LOW (ref 36.0–46.0)
Hemoglobin: 11.6 g/dL — ABNORMAL LOW (ref 12.0–15.0)
MCH: 25.3 pg — ABNORMAL LOW (ref 26.0–34.0)
MCHC: 33 g/dL (ref 30.0–36.0)
MCV: 76.6 fL — ABNORMAL LOW (ref 80.0–100.0)
Platelets: 210 10*3/uL (ref 150–400)
RBC: 4.58 MIL/uL (ref 3.87–5.11)
RDW: 12.8 % (ref 11.5–15.5)
WBC: 8.9 10*3/uL (ref 4.0–10.5)
nRBC: 0 % (ref 0.0–0.2)

## 2020-02-22 LAB — RESPIRATORY PANEL BY RT PCR (FLU A&B, COVID)
Influenza A by PCR: NEGATIVE
Influenza B by PCR: NEGATIVE
SARS Coronavirus 2 by RT PCR: NEGATIVE

## 2020-02-22 LAB — CBG MONITORING, ED
Glucose-Capillary: 281 mg/dL — ABNORMAL HIGH (ref 70–99)
Glucose-Capillary: 274 mg/dL — ABNORMAL HIGH (ref 70–99)

## 2020-02-22 LAB — HEMOGLOBIN A1C
Hgb A1c MFr Bld: 9.9 % — ABNORMAL HIGH (ref 4.8–5.6)
Mean Plasma Glucose: 237.43 mg/dL

## 2020-02-22 LAB — SODIUM, URINE, RANDOM: Sodium, Ur: 25 mmol/L

## 2020-02-22 LAB — OSMOLALITY, URINE: Osmolality, Ur: 193 mOsm/kg — ABNORMAL LOW (ref 300–900)

## 2020-02-22 LAB — TROPONIN I (HIGH SENSITIVITY): Troponin I (High Sensitivity): 8 ng/L (ref <18)

## 2020-02-22 MED ORDER — AMLODIPINE BESYLATE 10 MG PO TABS
10.0000 mg | ORAL_TABLET | Freq: Every day | ORAL | Status: DC
  Administered 2020-02-22 – 2020-02-24 (×3): 10 mg via ORAL
  Filled 2020-02-22: qty 1
  Filled 2020-02-22: qty 2
  Filled 2020-02-22: qty 1

## 2020-02-22 MED ORDER — DICLOFENAC SODIUM 1 % EX GEL: 2.0000 g | Freq: Four times a day (QID) | CUTANEOUS | Status: DC

## 2020-02-22 MED ORDER — PRAVASTATIN SODIUM 40 MG PO TABS
80.0000 mg | ORAL_TABLET | Freq: Every day | ORAL | Status: DC
  Administered 2020-02-22 – 2020-02-23 (×2): 80 mg via ORAL
  Filled 2020-02-22 (×2): qty 2

## 2020-02-22 MED ORDER — ASPIRIN 81 MG PO CHEW
81.0000 mg | CHEWABLE_TABLET | Freq: Every day | ORAL | Status: DC
  Administered 2020-02-22 – 2020-02-24 (×3): 81 mg via ORAL
  Filled 2020-02-22 (×3): qty 1

## 2020-02-22 MED ORDER — SODIUM CHLORIDE 0.9% FLUSH
3.0000 mL | Freq: Two times a day (BID) | INTRAVENOUS | Status: DC
  Administered 2020-02-22 – 2020-02-24 (×5): 3 mL via INTRAVENOUS

## 2020-02-22 MED ORDER — ENOXAPARIN SODIUM 40 MG/0.4ML ~~LOC~~ SOLN
40.0000 mg | SUBCUTANEOUS | Status: DC
  Administered 2020-02-22 – 2020-02-23 (×2): 40 mg via SUBCUTANEOUS
  Filled 2020-02-22 (×2): qty 0.4

## 2020-02-22 MED ORDER — INSULIN ASPART 100 UNIT/ML ~~LOC~~ SOLN
0.0000 [IU] | Freq: Three times a day (TID) | SUBCUTANEOUS | Status: DC
  Administered 2020-02-22: 5 [IU] via SUBCUTANEOUS
  Administered 2020-02-23 – 2020-02-24 (×4): 3 [IU] via SUBCUTANEOUS
  Administered 2020-02-24: 1 [IU] via SUBCUTANEOUS

## 2020-02-22 MED ORDER — TRAMADOL HCL 50 MG PO TABS
50.0000 mg | ORAL_TABLET | Freq: Four times a day (QID) | ORAL | Status: DC | PRN
  Administered 2020-02-22: 50 mg via ORAL
  Filled 2020-02-22: qty 1

## 2020-02-22 MED ORDER — ACETAMINOPHEN 325 MG PO TABS
650.0000 mg | ORAL_TABLET | Freq: Four times a day (QID) | ORAL | Status: DC | PRN
  Administered 2020-02-23 – 2020-02-24 (×2): 650 mg via ORAL
  Filled 2020-02-22 (×2): qty 2

## 2020-02-22 MED ORDER — ACETAMINOPHEN 650 MG RE SUPP: 650.0000 mg | Freq: Four times a day (QID) | RECTAL | Status: DC | PRN

## 2020-02-22 MED ORDER — METOPROLOL TARTRATE 12.5 MG HALF TABLET
12.5000 mg | ORAL_TABLET | Freq: Two times a day (BID) | ORAL | Status: DC
  Administered 2020-02-22 – 2020-02-24 (×5): 12.5 mg via ORAL
  Filled 2020-02-22 (×5): qty 1

## 2020-02-22 MED ORDER — SODIUM CHLORIDE 0.9 % IV SOLN: Freq: Once | INTRAVENOUS | Status: AC

## 2020-02-22 MED ORDER — SODIUM CHLORIDE 0.9% FLUSH: 3.0000 mL | INTRAVENOUS | Status: DC | PRN

## 2020-02-22 MED ORDER — SODIUM CHLORIDE 0.9 % IV SOLN: 250.0000 mL | INTRAVENOUS | Status: DC | PRN

## 2020-02-22 MED ORDER — DICLOFENAC SODIUM 1 % EX GEL
2.0000 g | Freq: Four times a day (QID) | CUTANEOUS | Status: DC
  Administered 2020-02-23 – 2020-02-24 (×6): 2 g via TOPICAL
  Filled 2020-02-22: qty 100

## 2020-02-22 NOTE — ED Triage Notes (Signed)
BIB [facility] after family called to report pt having ongoing C/P since last night. PT reported to EMS that pain radiates to neck and jaw. Pt has hx of DM, heart block. Pt given 324 mg aspirin, 0.4 mg Nitro SL on ambulance.

## 2020-02-22 NOTE — Progress Notes (Signed)
NEW ADMISSION NOTE New Admission Note:   Arrival Method: Stretcher              Mental Orientation:  Alert Telemetry: Yes Box 10 Assessment: Completed Skin:  Intact IV:  Right hand  Pain: denies Tubes: none Safety Measures: Safety Fall Prevention Plan has been given, discussed and signed Admission: Completed [facility] Orientation: Patient has been orientated to the room, unit and staff.  Family:  Orders have been reviewed and implemented. Will continue to monitor the patient. Call light has been placed within reach and bed alarm has been activated.   Berneta Levins, RN

## 2020-02-22 NOTE — ED Provider Notes (Signed)
[facility] EMERGENCY DEPARTMENT Provider Note   CSN: [id number] Arrival date & time: 02/22/20  7672     History Chief Complaint  Patient presents with  . Chest Pain    Amanda [name] is a 74 y.o. female.  74 year old female with prior medical history as detailed below presents for evaluation of reported chest discomfort.  Patient with chest pain that began last night around 9 PM.  Pain continued this morning.  Patient was sent to the ED by her son.  Patient with significant prior cardiac history.  Upon arrival to the ED patient does feel improved.  She is reporting that her pain is better.  She is complaining of right upper jaw dental pain.  This appears to be a longstanding chronic complaint.  Patient does speak [place].  Translation provided by the patient's son at bedside.  The history is provided by the patient, a relative and medical records. A language interpreter was used.  Illness Location:  Chest pain Severity:  Mild Onset quality:  Gradual Duration:  10 hours Timing:  Constant Progression:  Waxing and waning Chronicity:  New Associated symptoms: chest pain        Past Medical History:  Diagnosis Date  . Abdominal pain   . Chest pain    Atypical  . Diabetes mellitus   . GERD (gastroesophageal reflux disease)   . Hyperlipidemia   . Hypertension     Patient Active Problem List   Diagnosis Date Noted  . 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 Coronary artery disease 10/04/2011  . Type 2 diabetes mellitus ([place]) 10/04/2011  . Chest pain 09/25/2011  . HELICOBACTER PYLORI INFECTION 01/25/2009  . DIABETIC FOOT ULCER 01/22/2009  . GERD 12/30/2008  . MURMUR 11/26/2008  . URINARY HESITANCY 11/26/2008  . HYPERCHOLESTEROLEMIA 09/14/2008  . HYPERTENSION, BENIGN ESSENTIAL 09/14/2008  . CONSTIPATION 06/08/2008  . PRURITUS 05/21/2008  . DRY SKIN 05/21/2008  . 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 CARDIAC SURGERY    . 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 CORONARY ARTERY BYPASS GRAFT  10/05/2011   Procedure: CORONARY ARTERY BYPASS GRAFTING (CABG);  Surgeon: Ivin Poot, MD;  Location: [facility];  Service: Open Heart Surgery;  Laterality: N/A;  TEE  . LEFT HEART CATHETERIZATION WITH CORONARY ANGIOGRAM N/A 10/04/2011   Procedure: LEFT HEART CATHETERIZATION WITH CORONARY ANGIOGRAM;  Surgeon: Sherren Mocha, MD;  Location: [facility] CATH LAB;  Service: Cardiovascular;  Laterality: N/A;     OB History   No obstetric history on file.     Family History  Family history unknown: Yes    Social History   Tobacco Use  . Smoking status: Never Smoker  . Smokeless tobacco: Never Used  Vaping Use  . Vaping Use: Never used  Substance Use Topics  . Alcohol use: No  . Drug use: No    Home Medications Prior to Admission medications   Medication Sig Start Date End Date Taking? Authorizing Provider  amLODipine (NORVASC) 10 MG tablet Take 10 mg by mouth daily.  09/10/17   [provider]  aspirin 81 MG tablet Take 81 mg by mouth daily.    [provider]  cephALEXin (KEFLEX) 500 MG capsule Take 1 capsule (500 mg total) by mouth 4 (four) times daily. 06/22/18   Wieters, Hallie C, PA-C  cilostazol (PLETAL) 100 MG tablet Take 1 tablet (100 mg total) by mouth 2 (two) times daily.  11/18/18   Wellington Hampshire, MD  gabapentin (NEURONTIN) 100 MG capsule Take 1 capsule by mouth at bedtime. 03/24/18   [provider]  glipiZIDE (GLUCOTROL) 10 MG tablet Take 1 tablet by mouth daily. 03/05/18   [provider]  hydrochlorothiazide (HYDRODIURIL) 25 MG tablet Take 25 mg by mouth daily.    [provider]  ibuprofen (ADVIL,MOTRIN) 400 MG tablet Take 1 tablet (400 mg total) by mouth every 6 (six) hours as needed. 06/22/18   Wieters, Hallie C, PA-C  losartan (COZAAR) 100 MG tablet Take 100 mg by mouth daily.    [provider]  metFORMIN  (GLUCOPHAGE) 500 MG tablet Take 500 mg by mouth 2 (two) times daily with a meal.    [provider]  mupirocin ointment (BACTROBAN) 2 % Apply 1 application topically 2 (two) times daily. 06/22/18   Wieters, Hallie C, PA-C  pravastatin (PRAVACHOL) 80 MG tablet Take 80 mg by mouth daily.    [provider]  TRADJENTA 5 MG TABS tablet TK 1 T PO  D 09/10/17   [provider]  traMADol (ULTRAM) 50 MG tablet Take 1 tablet (50 mg total) by mouth every 6 (six) hours as needed. 01/05/12   Ivin Poot, MD    Allergies    Patient has no known allergies.  Review of Systems   Review of Systems  Cardiovascular: Positive for chest pain.  All other systems reviewed and are negative.   Physical Exam Updated Vital Signs BP (!) 174/71   Pulse 94   Temp 98.5 F (36.9 C)   Resp 19   SpO2 97%   Physical Exam Vitals and nursing note reviewed.  Constitutional:      General: She is not in acute distress.    Appearance: She is well-developed.  HENT:     Head: Normocephalic and atraumatic.     Comments: Patient is localizing mild dental pain to the right upper jaw.  No dental abscess or significant dental decay noted at the site. Eyes:     Conjunctiva/sclera: Conjunctivae normal.  Cardiovascular:     Rate and Rhythm: Normal rate and regular rhythm.     Heart sounds: Murmur heard.  Systolic murmur is present.   Pulmonary:     Effort: Pulmonary effort is normal. No respiratory distress.     Breath sounds: Normal breath sounds.  Abdominal:     Palpations: Abdomen is soft.     Tenderness: There is no abdominal tenderness.  Musculoskeletal:     Cervical back: Neck supple.  Skin:    General: Skin is warm and dry.  Neurological:     Mental Status: She is alert.     ED Results / Procedures / Treatments   Labs (all labs ordered are listed, but only abnormal results are displayed) Labs Reviewed  RESPIRATORY PANEL BY RT PCR (FLU A&B, COVID)  BASIC METABOLIC PANEL    CBC WITH DIFFERENTIAL/PLATELET  TROPONIN I (HIGH SENSITIVITY)    EKG EKG Interpretation  Date/Time:  Sunday February 22 2020 06:56:37 EDT Ventricular Rate:  95 PR Interval:    QRS Duration: 88 QT Interval:  426 QTC Calculation: 536 R Axis:   57 Text Interpretation: Sinus rhythm Probable left atrial enlargement Borderline low voltage, extremity leads Nonspecific T abnormalities, lateral leads Prolonged QT interval Confirmed by Dene Gentry [phone]) on 02/22/2020 6:59:50 AM   Radiology No results found.  Procedures Procedures (including critical care time)  Medications Ordered in ED Medications - No data  to display  ED Course  I have reviewed the triage vital signs and the nursing notes.  Pertinent labs & imaging results that were available during my care of the patient were reviewed by me and considered in my medical decision making (see chart for details).    MDM Rules/Calculators/A&P                          MDM  Screen complete  Jari Carollo was evaluated in Emergency Department on 02/22/2020 for the symptoms described in the history of present illness. She was evaluated in the context of the global COVID-19 pandemic, which necessitated consideration that the patient might be at risk for infection with the SARS-CoV-2 virus that causes COVID-19. Institutional protocols and algorithms that pertain to the evaluation of patients at risk for COVID-19 are in a state of rapid change based on information released by regulatory bodies including the CDC and federal and state organizations. These policies and algorithms were followed during the patient's care in the ED.   Patient is presenting for evaluation of reported chest discomfort.  Patient's reported chest discomfort was first noted by family around 9 PM last night.  Patient reports that her pain is improved this morning.  EKG is without acute ischemia.  Troponin is 8.  Of note, patient has significant hyponatremia  at 121.  Patient will benefit from admission for further evaluation of reported chest discomfort and observed hyponatremia.  Hospitalist service is aware of case and will evaluate for admission.   Final Clinical Impression(s) / ED Diagnoses Final diagnoses:  Chest pain, unspecified type  Hyponatremia    Rx / DC Orders ED Discharge Orders    None       Valarie Merino, MD 02/22/20 1010

## 2020-02-22 NOTE — H&P (Signed)
History and Physical:    Amanda [name]   BZJ:[id number] DOB: 03-10-1946 DOA: 02/22/2020  Referring MD/provider: Dr. Francia Greaves PCP: [name], MD   Patient coming from: Home  Chief Complaint: Chest pain.'s history is entirely per patient's son Amanda [name] over the phone.  Patient speaks only your robot and we do not have your about translation on our translator.  History of Present Illness:   Amanda [name] is an 74 y.o. female who [facility] by Dr.Avbuere [phone] or [phone] for HTN, CAD and DM 2.  Patient apparently has been her USO H until yesterday when she seemed to be more tired than usual per her son.  At baseline patient is not very active.  He notes that she can walk about 100 feet but then has to stop secondary to fatigue.  This has been her baseline for a long time.  Yesterday she seemed more tired than usual and when he asked her about it she complained of substernal chest pain.  This morning she was still feeling of chest discomfort and fatigue so decided to come to the ED.  He denies that she has had any fevers or chills.  She has not complained of any abdominal pain.  She has not had any diarrhea or vomiting.  She does not appear to be acutely ill.  Notes that he she is as active as per usual which is not very active at all.  She does apparently go out to their [place] farm and look at the goats as her daily exercise.  Patient son also notes that she has been complaining of tooth pain for several weeks.  He notes that this morning her tooth was bothering her more than her chest.  Patient son does admit that they significantly decrease her salt intake due to her diagnosis of hypertension.  ED Course:  The patient was noted to be afebrile with normal vital signs.  EKG was unremarkable.  Her pain had resolved entirely.  Her main complaint in the ED was dental pain in her upper jaw.  Laboratory data however were notable for a sodium  of 121 and a creatinine of 1.26.  The last laboratory data we have on the patient is from 2013.  Unfortunately [facility] is closed over the weekend but patient son states that she had blood drawn probably 6 months ago.  Patient is now admitted for work-up and management of hyponatremia.  ROS:   ROS   Review of Systems: Unable to provide due to lack of translator.  Past Medical History:   Past Medical History:  Diagnosis Date  . Abdominal pain   . Chest pain    Atypical  . Diabetes mellitus   . GERD (gastroesophageal reflux disease)   . Hyperlipidemia   . Hypertension     Past Surgical History:   Past Surgical History:  Procedure Laterality Date  . CARDIAC SURGERY    . 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 CORONARY ARTERY BYPASS GRAFT  10/05/2011   Procedure: CORONARY ARTERY BYPASS GRAFTING (CABG);  Surgeon: Ivin Poot, MD;  Location: [place];  Service: Open Heart Surgery;  Laterality: N/A;  TEE  . LEFT HEART CATHETERIZATION WITH CORONARY ANGIOGRAM N/A 10/04/2011   Procedure: LEFT HEART CATHETERIZATION WITH CORONARY ANGIOGRAM;  Surgeon: Sherren Mocha, MD;  Location: [facility] CATH LAB;  Service: Cardiovascular;  Laterality: N/A;    Social History:   Social History  Socioeconomic History  . Marital status: Widowed    Spouse name: Not on file  . Number of children: 6  . Years of education: Not on file  . Highest education level: Not on file  Occupational History  . Not on file  Tobacco Use  . Smoking status: Never Smoker  . Smokeless tobacco: Never Used  Vaping Use  . Vaping Use: Never used  Substance and Sexual Activity  . Alcohol use: No  . Drug use: No  . Sexual activity: Not on file  Other Topics Concern  . Not on file  Social History Narrative   Lives in [place], [place] with son.    Social Determinants of Health   Financial Resource Strain:   . Difficulty of Paying Living Expenses: Not on file  Food Insecurity:   . Worried About Paediatric nurse in the Last Year: Not on file  . Ran Out of Food in the Last Year: Not on file  Transportation Needs:   . Lack of Transportation (Medical): Not on file  . Lack of Transportation (Non-Medical): Not on file  Physical Activity:   . Days of Exercise per Week: Not on file  . Minutes of Exercise per Session: Not on file  Stress:   . Feeling of Stress : Not on file  Social Connections:   . Frequency of Communication with Friends and Family: Not on file  . Frequency of Social Gatherings with Friends and Family: Not on file  . Attends Religious Services: Not on file  . Active Member of Clubs or Organizations: Not on file  . Attends Archivist Meetings: Not on file  . Marital Status: Not on file  Intimate Partner Violence:   . Fear of Current or Ex-Partner: Not on file  . Emotionally Abused: Not on file  . Physically Abused: Not on file  . Sexually Abused: Not on file    Allergies   Patient has no known allergies.  Family history:   Family History  Family history unknown: Yes    Current Medications:   Prior to Admission medications   Medication Sig Start Date End Date Taking? Authorizing Provider  amLODipine (NORVASC) 10 MG tablet Take 10 mg by mouth daily.  09/10/17  Yes [provider]  diclofenac Sodium (VOLTAREN) 1 % GEL Apply 1 application topically 4 (four) times daily. 02/15/20  Yes [provider]  glipiZIDE (GLUCOTROL) 10 MG tablet Take 1 tablet by mouth daily. 03/05/18  Yes [provider]  hydrochlorothiazide (HYDRODIURIL) 25 MG tablet Take 25 mg by mouth daily.   Yes [provider]  losartan (COZAAR) 100 MG tablet Take 100 mg by mouth daily.   Yes [provider]  metFORMIN (GLUCOPHAGE) 500 MG tablet Take 500 mg by mouth 2 (two) times daily with a meal.   Yes [provider]  pravastatin (PRAVACHOL) 80 MG tablet Take 80 mg by mouth daily.   Yes [provider]  traMADol (ULTRAM) 50 MG  tablet Take 1 tablet (50 mg total) by mouth every 6 (six) hours as needed. Patient taking differently: Take 50 mg by mouth every 6 (six) hours as needed for moderate pain.  01/05/12  Yes Ivin Poot, MD  aspirin 81 MG tablet Take 81 mg by mouth daily.    [provider]  cilostazol (PLETAL) 100 MG tablet Take 1 tablet (100 mg total) by mouth 2 (two) times daily. Patient not taking: Reported on 02/22/2020 11/18/18   Kathlyn Sacramento  A, MD  ibuprofen (ADVIL,MOTRIN) 400 MG tablet Take 1 tablet (400 mg total) by mouth every 6 (six) hours as needed. Patient not taking: Reported on 02/22/2020 06/22/18   Wieters, Madelynn Done C, PA-C  mupirocin ointment (BACTROBAN) 2 % Apply 1 application topically 2 (two) times daily. Patient not taking: Reported on 02/22/2020 06/22/18   Janith Lima, [place]    Physical Exam:   Vitals:   02/22/20 1045 02/22/20 1100 02/22/20 1200 02/22/20 1215  BP: (!) 155/77 (!) 170/87 (!) 173/85 (!) 165/76  Pulse: 96 96 (!) 102 (!) 101  Resp: 20 (!) 30 (!) 21 18  Temp:      SpO2: 98% 97% 97% 97%     Physical Exam: Blood pressure (!) 165/76, pulse (!) 101, temperature 98.5 F (36.9 C), resp. rate 18, SpO2 97 %. Gen: Comfortable appearing patient looking older than stated age sitting up in bed in no distress. Eyes: sclera anicteric, conjuctiva mildly injected bilaterally CVS: S1-S2, regulary, no gallops Respiratory:  decreased air entry likely secondary to decreased inspiratory effort GI: NABS, soft, NT  LE: No edema. No cyanosis Neuro: Complete assessment however appears grossly nonfocal.   Data Review:    Labs: Basic Metabolic Panel: Recent Labs  Lab 02/22/20 0820  NA 121*  K 5.0  CL 86*  CO2 22  GLUCOSE 302*  BUN 17  CREATININE 1.26*  CALCIUM 9.6   Liver Function Tests: No results for input(s): AST, ALT, ALKPHOS, BILITOT, PROT, ALBUMIN in the last 168 hours. No results for input(s): LIPASE, AMYLASE in the last 168 hours. No results for input(s):  AMMONIA in the last 168 hours. CBC: Recent Labs  Lab 02/22/20 0820  WBC 8.2  NEUTROABS 6.0  HGB 10.7*  HCT 32.3*  MCV 75.1*  PLT 202   Cardiac Enzymes: No results for input(s): CKTOTAL, CKMB, CKMBINDEX, TROPONINI in the last 168 hours.  BNP (last 3 results) No results for input(s): PROBNP in the last 8760 hours. CBG: No results for input(s): GLUCAP in the last 168 hours.  Urinalysis    Component Value Date/Time   COLORURINE STRAW (A) 02/22/2020 0932   APPEARANCEUR CLEAR 02/22/2020 0932   LABSPEC 1.002 (L) 02/22/2020 0932   PHURINE 7.0 02/22/2020 0932   GLUCOSEU >=500 (A) 02/22/2020 0932   HGBUR SMALL (A) 02/22/2020 0932   HGBUR negative 04/26/2010 0858   BILIRUBINUR NEGATIVE 02/22/2020 0932   KETONESUR NEGATIVE 02/22/2020 0932   PROTEINUR NEGATIVE 02/22/2020 0932   UROBILINOGEN 0.2 10/05/2011 0018   NITRITE NEGATIVE 02/22/2020 0932   LEUKOCYTESUR NEGATIVE 02/22/2020 0932      Radiographic Studies: DG Chest Port 1 View  Result Date: 02/22/2020 CLINICAL DATA:  Chest pain EXAM: PORTABLE CHEST 1 VIEW COMPARISON:  October 13, 2013 FINDINGS: The heart size and mediastinal contours are stable. The heart size is enlarged. Both lungs are clear. The visualized skeletal structures are stable. IMPRESSION: No active disease. Electronically Signed   By: Abelardo Diesel M.D.   On: 02/22/2020 09:01    EKG: Independently reviewed.  Sinus rhythm at 95.  Normal axis.  First-degree heart block.  Nonspecific IVCD.  Q-wave in 3 and may be in F, also in V1.  Diffusely flattened T waves, no acute ST-T wave changes.   Assessment/Plan:   Principal Problem:   Hyponatremia Active Problems:   Hypertension   Chest pain   CAD (coronary artery disease)   Type 2 diabetes mellitus ([place])  74 year old female was brought in for chest pain is incidentally found to  have sodium of 121.  Patient is on HCTZ.  Her mental status is at baseline per patient's son.  Hyponatremia Unfortunately we do not  know what her most recent sodium is however [facility] can be called in the morning as warranted--phone numbers included in HPI. Most likely secondary to HCTZ although patient also has decreased p.o. sodium intake at home. Patient received 1 L normal saline bolus in ED, will repeat BMP now then every 6 hours. Urine sodium and urine osmolality ordered. If urine sodium is low, will treat with normal saline at 75 cc an hour and check BMP every 6 hours. If urine sodium and molality are WNL, will treat with fluid restriction and salt tablets 2 g p.o. 3 times daily Discussed with Dr. Burnett Sheng, renal can be consulted in the morning if warranted.  Dental pain Routine dental consult can be called in the morning.  Chest pain in patient with known CAD Presently chest pain-free EKG is unremarkable, troponin is 8. Continue aspirin We will start beta-blocker/metoprolol as below. Further work-up either as an inpatient or outpatient as warranted.  HTN Hold HCTZ and losartan Continue amlodipine We will add low-dose metoprolol 12.5 mg twice daily, this can be uptitrated as needed.  DM2 Hold glipizide and Metformin SSI AC at bedtime, sensitive dosage.   Other information:   DVT prophylaxis: Enoxaparin ordered. Code Status: Full Family Communication: Spoke at length with patient's son Amanda [name]  Disposition Plan: Home Consults called: Curbside renal, no formal consult called Admission status: Inpatient  [facility]  If 7PM-7AM, please contact night-coverage [email or web address] Password [facility] 02/22/2020, 12:34 PM

## 2020-02-22 NOTE — ED Notes (Signed)
Lab to add Sodium urine and urine osmo.

## 2020-02-23 ENCOUNTER — Inpatient Hospital Stay (HOSPITAL_COMMUNITY): Payer: Medicare Other

## 2020-02-23 DIAGNOSIS — I361 Nonrheumatic tricuspid (valve) insufficiency: Secondary | ICD-10-CM

## 2020-02-23 DIAGNOSIS — E871 Hypo-osmolality and hyponatremia: Secondary | ICD-10-CM | POA: Diagnosis not present

## 2020-02-23 DIAGNOSIS — I340 Nonrheumatic mitral (valve) insufficiency: Secondary | ICD-10-CM | POA: Diagnosis not present

## 2020-02-23 LAB — BASIC METABOLIC PANEL
Anion gap: 10 (ref 5–15)
Anion gap: 12 (ref 5–15)
Anion gap: 12 (ref 5–15)
BUN: 16 mg/dL (ref 8–23)
BUN: 19 mg/dL (ref 8–23)
BUN: 21 mg/dL (ref 8–23)
CO2: 25 mmol/L (ref 22–32)
CO2: 23 mmol/L (ref 22–32)
CO2: 23 mmol/L (ref 22–32)
Calcium: 9.4 mg/dL (ref 8.9–10.3)
Calcium: 9.3 mg/dL (ref 8.9–10.3)
Calcium: 9.6 mg/dL (ref 8.9–10.3)
Chloride: 91 mmol/L — ABNORMAL LOW (ref 98–111)
Chloride: 92 mmol/L — ABNORMAL LOW (ref 98–111)
Chloride: 91 mmol/L — ABNORMAL LOW (ref 98–111)
Creatinine, Ser: 1.22 mg/dL — ABNORMAL HIGH (ref 0.44–1.00)
Creatinine, Ser: 1.41 mg/dL — ABNORMAL HIGH (ref 0.44–1.00)
Creatinine, Ser: 1.47 mg/dL — ABNORMAL HIGH (ref 0.44–1.00)
GFR, Estimated: 43 mL/min — ABNORMAL LOW (ref >60)
GFR, Estimated: 36 mL/min — ABNORMAL LOW (ref >60)
GFR, Estimated: 35 mL/min — ABNORMAL LOW (ref >60)
Glucose, Bld: 238 mg/dL — ABNORMAL HIGH (ref 70–99)
Glucose, Bld: 264 mg/dL — ABNORMAL HIGH (ref 70–99)
Glucose, Bld: 210 mg/dL — ABNORMAL HIGH (ref 70–99)
Potassium: 4.3 mmol/L (ref 3.5–5.1)
Potassium: 4.2 mmol/L (ref 3.5–5.1)
Potassium: 4.2 mmol/L (ref 3.5–5.1)
Sodium: 126 mmol/L — ABNORMAL LOW (ref 135–145)
Sodium: 127 mmol/L — ABNORMAL LOW (ref 135–145)
Sodium: 126 mmol/L — ABNORMAL LOW (ref 135–145)

## 2020-02-23 LAB — CBC
HCT: 33.3 % — ABNORMAL LOW (ref 36.0–46.0)
Hemoglobin: 11 g/dL — ABNORMAL LOW (ref 12.0–15.0)
MCH: 25.6 pg — ABNORMAL LOW (ref 26.0–34.0)
MCHC: 33 g/dL (ref 30.0–36.0)
MCV: 77.4 fL — ABNORMAL LOW (ref 80.0–100.0)
Platelets: 190 10*3/uL (ref 150–400)
RBC: 4.3 MIL/uL (ref 3.87–5.11)
RDW: 12.2 % (ref 11.5–15.5)
WBC: 8.8 10*3/uL (ref 4.0–10.5)
nRBC: 0 % (ref 0.0–0.2)

## 2020-02-23 LAB — GLUCOSE, CAPILLARY
Glucose-Capillary: 248 mg/dL — ABNORMAL HIGH (ref 70–99)
Glucose-Capillary: 243 mg/dL — ABNORMAL HIGH (ref 70–99)
Glucose-Capillary: 236 mg/dL — ABNORMAL HIGH (ref 70–99)
Glucose-Capillary: 207 mg/dL — ABNORMAL HIGH (ref 70–99)

## 2020-02-23 LAB — ECHOCARDIOGRAM COMPLETE
AR max vel: 1.21 cm2
AV Area VTI: 1.07 cm2
AV Area mean vel: 1.05 cm2
AV Mean grad: 9 mmHg
AV Peak grad: 13.5 mmHg
Ao pk vel: 1.84 m/s
Area-P 1/2: 3.03 cm2
S' Lateral: 2.2 cm

## 2020-02-23 LAB — MAGNESIUM: Magnesium: 1.8 mg/dL (ref 1.7–2.4)

## 2020-02-23 MED ORDER — BIOTENE DRY MOUTH MT LIQD: 15.0000 mL | OROMUCOSAL | Status: DC | PRN

## 2020-02-23 MED ORDER — AMOXICILLIN-POT CLAVULANATE 500-125 MG PO TABS
1.0000 | ORAL_TABLET | Freq: Two times a day (BID) | ORAL | Status: DC
  Administered 2020-02-24: 500 mg via ORAL
  Filled 2020-02-23 (×2): qty 1

## 2020-02-23 MED ORDER — AMOXICILLIN-POT CLAVULANATE 875-125 MG PO TABS
1.0000 | ORAL_TABLET | Freq: Two times a day (BID) | ORAL | Status: DC
  Administered 2020-02-23 (×2): 1 via ORAL
  Filled 2020-02-23 (×2): qty 1

## 2020-02-23 MED ORDER — SODIUM CHLORIDE 0.9 % IV SOLN: INTRAVENOUS | Status: DC

## 2020-02-23 MED ORDER — ENOXAPARIN SODIUM 30 MG/0.3ML ~~LOC~~ SOLN
30.0000 mg | SUBCUTANEOUS | Status: DC
  Administered 2020-02-24: 30 mg via SUBCUTANEOUS
  Filled 2020-02-23: qty 0.3

## 2020-02-23 MED ORDER — PANTOPRAZOLE SODIUM 40 MG PO TBEC
40.0000 mg | DELAYED_RELEASE_TABLET | Freq: Every day | ORAL | Status: DC
  Administered 2020-02-23 – 2020-02-24 (×2): 40 mg via ORAL
  Filled 2020-02-23 (×2): qty 1

## 2020-02-23 MED ORDER — INSULIN GLARGINE 100 UNIT/ML ~~LOC~~ SOLN
15.0000 [IU] | Freq: Every day | SUBCUTANEOUS | Status: DC
  Administered 2020-02-23 – 2020-02-24 (×2): 15 [IU] via SUBCUTANEOUS
  Filled 2020-02-23 (×2): qty 0.15

## 2020-02-23 NOTE — Progress Notes (Signed)
Patient continue to remove telemetry off. Will update MD in am.

## 2020-02-23 NOTE — Progress Notes (Addendum)
PROGRESS NOTE    Amanda [name]  UXN:[id number] DOB: 01-14-1946 DOA: 02/22/2020 PCP: [name], MD   Brief Narrative: 74 year old with past medical history significant for hypertension, CAD, diabetes type 2 who presents with fatigue and feeling more tired.  Patient has been also complaining of chest pain.  Patient has been less active.  Patient has been complaining of tooth pain for several weeks.  Patient has been following a low-salt diet. ED: EKG was unremarkable, patient main complaint was dental pain.  She presented with hyponatremia with sodium level 121 and creatinine of 1.2.  Patient admitted for hyponatremia.  Assessment & Plan:   Principal Problem:   Hyponatremia Active Problems:   Hypertension   Chest pain   CAD (coronary artery disease)   Type 2 diabetes mellitus ([place])  1-Hyponatremia: -We will try to obtain records. -Presents with sodium at 121. Improved with IV fluids. Na -126-- -Could be related to hydrochlorothiazide, decreased oral sodium intake. -Patient received 1 L of IV fluids in the ED. -Urine osmolality: 193. Urine sodium 25 -Started on IV fluids. Repeat B-met tonight.  -August 132, cr 1.51.  2-Dental pain: -Orthopantogram: Right lower cuspid and right lower molar periapical lucencies compatible with periapical abscesses. -Start Biotene and oral antibiotics. She will need to follow up with dentist.   3-Chest pain with past medical history of CAD. EKG unremarkable troponins negative. Started on metoprolol.  ECHO pending.  Start protonix.   4-Hypertension: Continue to hold hydrochlorothiazide and losartan due to hyponatremia. Continue with amlodipine and added low-dose metoprolol.  5-Diabetes: Hold oral hypoglycemic agent while inpatient. Continue with a sliding scale insulin. HbA1c 9.9    Estimated body mass index is 27.57 kg/m as calculated from the following:   Height as of 06/18/18: _0  (1.397 m).   Weight as of 06/18/18: 53.8  kg.   DVT prophylaxis: Lovenox Code Status: Full code Family Communication: son updated over phone Disposition Plan:  Status is: Inpatient  Remains inpatient appropriate because:IV treatments appropriate due to intensity of illness or inability to take PO   Dispo: The patient is from: Home              Anticipated d/c is to: Home              Anticipated d/c date is: 2 days              Patient currently is not medically stable to d/c.        Consultants:   none  Procedures:   ECHO pending  Antimicrobials:  Augmentin 02/23/2020  Subjective: Son help with translation . Patient denies chest pain today.  Complaining of tooth pain. Denies diarrhea or constipation.   Objective: Vitals:   02/22/20 1647 02/22/20 1712 02/22/20 2102 02/23/20 0453  BP: (!) 143/69 136/71 (!) 144/66 135/72  Pulse: 84 87 (!) 102 92  Resp: (!) _1 Temp: 98.2 F (36.8 C) 99.7 F (37.6 C) 99.9 F (37.7 C) 99.5 F (37.5 C)  TempSrc: Oral Oral Oral Oral  SpO2: 97% 98% 97% 96%    Intake/Output Summary (Last 24 hours) at 02/23/2020 0745 Last data filed at 02/23/2020 0700 Gross per 24 hour  Intake 771.83 ml  Output 0 ml  Net 771.83 ml   There were no vitals filed for this visit.  Examination:  General exam: Appears calm and comfortable  Respiratory system: Clear to auscultation. Respiratory effort normal. Cardiovascular system: S1 & S2 heard, RRR. No JVD, murmurs, rubs, gallops  or clicks. No pedal edema. Gastrointestinal system: Abdomen is nondistended, soft and nontender. No organomegaly or masses felt. Normal bowel sounds heard. Central nervous system: Alert and oriented.  Extremities: Symmetric 5 x 5 power.   Data Reviewed: I have personally reviewed following labs and imaging studies  CBC: Recent Labs  Lab 02/22/20 0820 02/22/20 1510  WBC 8.2 8.9  NEUTROABS 6.0  --   HGB 10.7* 11.6*  HCT 32.3* 35.1*  MCV 75.1* 76.6*  PLT 202 329   Basic Metabolic  Panel: Recent Labs  Lab 02/22/20 0820 02/22/20 1810 02/23/20 0118  NA 121* 126* 126*  K 5.0 4.4 4.3  CL 86* 91* 91*  CO2 _0 GLUCOSE 302* 248* 238*  BUN _1 CREATININE 1.26* 1.17* 1.22*  CALCIUM 9.6 9.5 9.4   GFR: CrCl cannot be calculated (Unknown ideal weight.). Liver Function Tests: No results for input(s): AST, ALT, ALKPHOS, BILITOT, PROT, ALBUMIN in the last 168 hours. No results for input(s): LIPASE, AMYLASE in the last 168 hours. No results for input(s): AMMONIA in the last 168 hours. Coagulation Profile: No results for input(s): INR, PROTIME in the last 168 hours. Cardiac Enzymes: No results for input(s): CKTOTAL, CKMB, CKMBINDEX, TROPONINI in the last 168 hours. BNP (last 3 results) No results for input(s): PROBNP in the last 8760 hours. HbA1C: Recent Labs    02/22/20 1510  HGBA1C 9.9*   CBG: Recent Labs  Lab 02/22/20 1309 02/22/20 1624 02/23/20 0718  GLUCAP 281* 274* 248*   Lipid Profile: No results for input(s): CHOL, HDL, LDLCALC, TRIG, CHOLHDL, LDLDIRECT in the last 72 hours. Thyroid Function Tests: No results for input(s): TSH, T4TOTAL, FREET4, T3FREE, THYROIDAB in the last 72 hours. Anemia Panel: No results for input(s): VITAMINB12, FOLATE, FERRITIN, TIBC, IRON, RETICCTPCT in the last 72 hours. Sepsis Labs: No results for input(s): PROCALCITON, LATICACIDVEN in the last 168 hours.  Recent Results (from the past 240 hour(s))  Respiratory Panel by RT PCR (Flu A&B, Covid) - Nasopharyngeal Swab     Status: None   Collection Time: 02/22/20  7:20 AM   Specimen: Nasopharyngeal Swab  Result Value Ref Range Status   SARS Coronavirus 2 by RT PCR NEGATIVE NEGATIVE Final    Comment: (NOTE) SARS-CoV-2 target nucleic acids are NOT DETECTED.  The SARS-CoV-2 RNA is generally detectable in upper respiratoy specimens during the acute phase of infection. The lowest concentration of SARS-CoV-2 viral copies this assay can detect is 131 copies/mL. A  negative result does not preclude SARS-Cov-2 infection and should not be used as the sole basis for treatment or other patient management decisions. A negative result may occur with  improper specimen collection/handling, submission of specimen other than nasopharyngeal swab, presence of viral mutation(s) within the areas targeted by this assay, and inadequate number of viral copies (<131 copies/mL). A negative result must be combined with clinical observations, patient history, and epidemiological information. The expected result is Negative.  Fact Sheet for Patients:  PinkCheek.be  Fact Sheet for Healthcare Providers:  GravelBags.it  This test is no t yet approved or cleared by the [place] FDA and  has been authorized for detection and/or diagnosis of SARS-CoV-2 by FDA under an Emergency Use Authorization (EUA). This EUA will remain  in effect (meaning this test can be used) for the duration of the COVID-19 declaration under Section 564(b)(1) of the Act, 21 U.S.C. section 360bbb-3(b)(1), unless the authorization is terminated or revoked sooner.     Influenza A by PCR  NEGATIVE NEGATIVE Final   Influenza B by PCR NEGATIVE NEGATIVE Final    Comment: (NOTE) The Xpert Xpress SARS-CoV-2/FLU/RSV assay is intended as an aid in  the diagnosis of influenza from Nasopharyngeal swab specimens and  should not be used as a sole basis for treatment. Nasal washings and  aspirates are unacceptable for Xpert Xpress SARS-CoV-2/FLU/RSV  testing.  Fact Sheet for Patients: PinkCheek.be  Fact Sheet for Healthcare Providers: GravelBags.it  This test is not yet approved or cleared by the [place] FDA and  has been authorized for detection and/or diagnosis of SARS-CoV-2 by  FDA under an Emergency Use Authorization (EUA). This EUA will remain  in effect (meaning this test can  be used) for the duration of the  Covid-19 declaration under Section 564(b)(1) of the Act, 21  U.S.C. section 360bbb-3(b)(1), unless the authorization is  terminated or revoked. Performed at [facility], [address], [place], [place] [address]          Radiology Studies: DG Chest Port 1 View  Result Date: 02/22/2020 CLINICAL DATA:  Chest pain EXAM: PORTABLE CHEST 1 VIEW COMPARISON:  October 13, 2013 FINDINGS: The heart size and mediastinal contours are stable. The heart size is enlarged. Both lungs are clear. The visualized skeletal structures are stable. IMPRESSION: No active disease. Electronically Signed   By: Abelardo Diesel M.D.   On: 02/22/2020 09:01        Scheduled Meds: . amLODipine  10 mg Oral Daily  . aspirin  81 mg Oral Daily  . diclofenac Sodium  2 g Topical QID  . enoxaparin (LOVENOX) injection  40 mg Subcutaneous Q24H  . insulin aspart  0-9 Units Subcutaneous TID WC  . metoprolol tartrate  12.5 mg Oral BID  . pravastatin  80 mg Oral q1800  . sodium chloride flush  3 mL Intravenous Q12H   Continuous Infusions: . sodium chloride       LOS: 1 day    Time spent: 35 minutes.     Elmarie Shiley, MD [facility]   If 7PM-7AM, please contact night-coverage [email or web address]  02/23/2020, 7:45 AM

## 2020-02-23 NOTE — Plan of Care (Signed)
  Problem: Activity: Goal: Risk for activity intolerance will decrease Outcome: Progressing   Problem: Nutrition: Goal: Adequate nutrition will be maintained Outcome: Progressing   

## 2020-02-23 NOTE — Progress Notes (Addendum)
Inpatient Diabetes Program Recommendations  AACE/ADA: New Consensus Statement on Inpatient Glycemic Control (2015)  Target Ranges:  Prepandial:   less than 140 mg/dL      Peak postprandial:   less than 180 mg/dL (1-2 hours)      Critically ill patients:  140 - 180 mg/dL   Lab Results  Component Value Date   GLUCAP 248 (H) 02/23/2020   HGBA1C 9.9 (H) 02/22/2020    Review of Glycemic Control Results for Amanda [name], Amanda [name] (MRN [id number]) as of 02/23/2020 10:23  Ref. Range 02/22/2020 13:09 02/22/2020 16:24 02/23/2020 07:18  Glucose-Capillary Latest Ref Range: 70 - 99 mg/dL 281 (H) 274 (H) 248 (H)   Diabetes history: DM 2 Outpatient Diabetes medications:  Glucotrol 10 mg daily, Metformin 500 mg bid Current orders for Inpatient glycemic control:  Novolog sensitive tid with meals   Inpatient Diabetes Program Recommendations:    Please consider adding Lantus 8 units daily.   Thanks,  Adah Perl, RN, BC-ADM Inpatient Diabetes Coordinator Pager [phone] (8a-5p)  Addendum:  Spoke with patient's son.  He states that they were checking blood sugars at home time and that they were less than 100 mg/dL.  We discussed current A1C and that it appears blood sugars have been trending up> 200 mg/dL.  We discussed normal blood sugars of 80-130 mg/dL.  He states that his mothers appetite has not been as good recently.  She drinks mostly water at home.  Asked son to begin checking blood sugars one to 2 times per day and write down so that he can share with her PCP.  Discussed importance of blood sugar control.  He verbalized understanding.

## 2020-02-23 NOTE — Plan of Care (Signed)
  Problem: Activity: Goal: Risk for activity intolerance will decrease Outcome: Completed/Met   Problem: Nutrition: Goal: Adequate nutrition will be maintained Outcome: Completed/Met   

## 2020-02-23 NOTE — Progress Notes (Signed)
  Echocardiogram 2D Echocardiogram has been performed.  Amanda [name] 02/23/2020, 11:47 AM

## 2020-02-24 DIAGNOSIS — E871 Hypo-osmolality and hyponatremia: Secondary | ICD-10-CM | POA: Diagnosis not present

## 2020-02-24 LAB — BASIC METABOLIC PANEL
Anion gap: 10 (ref 5–15)
BUN: 17 mg/dL (ref 8–23)
CO2: 24 mmol/L (ref 22–32)
Calcium: 9.3 mg/dL (ref 8.9–10.3)
Chloride: 99 mmol/L (ref 98–111)
Creatinine, Ser: 1.51 mg/dL — ABNORMAL HIGH (ref 0.44–1.00)
GFR, Estimated: 33 mL/min — ABNORMAL LOW (ref >60)
Glucose, Bld: 144 mg/dL — ABNORMAL HIGH (ref 70–99)
Potassium: 3.9 mmol/L (ref 3.5–5.1)
Sodium: 133 mmol/L — ABNORMAL LOW (ref 135–145)

## 2020-02-24 LAB — CBC
HCT: 32.4 % — ABNORMAL LOW (ref 36.0–46.0)
Hemoglobin: 10.6 g/dL — ABNORMAL LOW (ref 12.0–15.0)
MCH: 25.5 pg — ABNORMAL LOW (ref 26.0–34.0)
MCHC: 32.7 g/dL (ref 30.0–36.0)
MCV: 78.1 fL — ABNORMAL LOW (ref 80.0–100.0)
Platelets: 210 10*3/uL (ref 150–400)
RBC: 4.15 MIL/uL (ref 3.87–5.11)
RDW: 12.1 % (ref 11.5–15.5)
WBC: 8.1 10*3/uL (ref 4.0–10.5)
nRBC: 0 % (ref 0.0–0.2)

## 2020-02-24 LAB — HEPATIC FUNCTION PANEL
ALT: 18 U/L (ref 0–44)
AST: 20 U/L (ref 15–41)
Albumin: 3.3 g/dL — ABNORMAL LOW (ref 3.5–5.0)
Alkaline Phosphatase: 70 U/L (ref 38–126)
Bilirubin, Direct: 0.1 mg/dL (ref 0.0–0.2)
Indirect Bilirubin: 0.6 mg/dL (ref 0.3–0.9)
Total Bilirubin: 0.7 mg/dL (ref 0.3–1.2)
Total Protein: 7 g/dL (ref 6.5–8.1)

## 2020-02-24 LAB — GLUCOSE, CAPILLARY
Glucose-Capillary: 136 mg/dL — ABNORMAL HIGH (ref 70–99)
Glucose-Capillary: 205 mg/dL — ABNORMAL HIGH (ref 70–99)

## 2020-02-24 MED ORDER — BIOTENE DRY MOUTH MT LIQD: 15.0000 mL | OROMUCOSAL | 0 refills | Status: DC | PRN

## 2020-02-24 MED ORDER — PHENOL 1.4 % MT LIQD
1.0000 | OROMUCOSAL | Status: DC | PRN
  Administered 2020-02-24: 1 via OROMUCOSAL
  Filled 2020-02-24: qty 177

## 2020-02-24 MED ORDER — AMOXICILLIN-POT CLAVULANATE 500-125 MG PO TABS: 1.0000 | ORAL_TABLET | Freq: Two times a day (BID) | ORAL | 0 refills | Status: AC

## 2020-02-24 MED ORDER — PANTOPRAZOLE SODIUM 40 MG PO TBEC: 40.0000 mg | DELAYED_RELEASE_TABLET | Freq: Every day | ORAL | 0 refills | Status: AC

## 2020-02-24 MED ORDER — PHENOL 1.4 % MT LIQD: 1.0000 | OROMUCOSAL | 0 refills | Status: DC | PRN

## 2020-02-24 MED ORDER — TRAMADOL HCL 50 MG PO TABS: 50.0000 mg | ORAL_TABLET | Freq: Four times a day (QID) | ORAL | 0 refills | Status: AC | PRN

## 2020-02-24 MED ORDER — METOPROLOL TARTRATE 25 MG PO TABS: 12.5000 mg | ORAL_TABLET | Freq: Two times a day (BID) | ORAL | 0 refills | Status: AC

## 2020-02-24 NOTE — Plan of Care (Signed)
  Problem: Education: Goal: Knowledge of General Education information will improve Description: Including pain rating scale, medication(s)/side effects and non-pharmacologic comfort measures Outcome: Adequate for Discharge   Problem: Health Behavior/Discharge Planning: Goal: Ability to manage health-related needs will improve Outcome: Adequate for Discharge   Problem: Education: Goal: Knowledge of General Education information will improve Description: Including pain rating scale, medication(s)/side effects and non-pharmacologic comfort measures Outcome: Adequate for Discharge   Problem: Health Behavior/Discharge Planning: Goal: Ability to manage health-related needs will improve Outcome: Adequate for Discharge   Problem: Health Behavior/Discharge Planning: Goal: Ability to manage health-related needs will improve Outcome: Adequate for Discharge   Problem: Clinical Measurements: Goal: Will remain free from infection Outcome: Adequate for Discharge   Problem: Clinical Measurements: Goal: Respiratory complications will improve Outcome: Adequate for Discharge

## 2020-02-24 NOTE — Discharge Summary (Signed)
Physician Discharge Summary  Amanda [name] HKV:[id number] DOB: 14-Aug-1945 DOA: 02/22/2020  PCP: [name], MD  Admit date: 02/22/2020 Discharge date: 02/24/2020  Admitted From: Home  Disposition: Home   Recommendations for Outpatient Follow-up:  1. Follow up with PCP in 1-2 weeks 2. Please obtain BMP/CBC in one week 3. Needs follow up with Dentist.   Home Health: yes.  Discharge Condition: Stable.  CODE STATUS: Full code Diet recommendation: Carb modified.   Brief/Interim Summary: 74 year old with past medical history significant for hypertension, CAD, diabetes type 2 who presents with fatigue and feeling more tired.  Patient has been also complaining of chest pain.  Patient has been less active.  Patient has been complaining of tooth pain for several weeks.  Patient has been following a low-salt diet. ED: EKG was unremarkable, patient main complaint was dental pain.  She presented with hyponatremia with sodium level 121 and creatinine of 1.2.  Patient admitted for hyponatremia. Tooth infection. She was started on Augmentin , received IV fluids. Her sodium improved with fluids. Plan to discontinue HCTZ at discharge.     1-Hyponatremia: -Presents with sodium at 121. Improved with IV fluids. Na -126--133 at discharge.  -Could be related to hydrochlorothiazide, decreased oral sodium intake. -Patient received 1 L of IV fluids in the ED. -Urine osmolality: 193. Urine sodium 25 -Started on IV fluids. Sodium improved to 133.  -Records from PCP : August sodium at  132, cr 1.51. -Improved , plan to discontinue HCTZ at discharge.   2-Dental pain: periapical abscess.  -Orthopantogram: Right lower cuspid and right lower molar periapical lucencies compatible with periapical abscesses. -Started  Biotene and oral antibiotics. She will need to follow up with dentist.  -Discussed with dentist, patient needs to follow up out patient.  -Mild fever today, continue with antibiotics. WBC  normal. Non toxic appearing.  -will provide tramadol for pain.   3-Chest pain with past medical history of CAD. EKG unremarkable troponins negative. Started on metoprolol.  ECHO normalEF Started  protonix.  Resolved.   4-Hypertension: Continue to hold hydrochlorothiazide and losartan due to hyponatremia. Started low-dose metoprolol. Hold Norvasc to avoid hypotension.   5-Diabetes: Hold oral hypoglycemic agent while inpatient. Continue with a sliding scale insulin. HbA1c 9.9 resume glipizide. Hold metformin due to cr 1.5   6-CKD satge III b.  Per records cr at 1.5 Cr today at 1.5 like baseline.  Will continue with glipizide, in case patient has poor oral intake due to tooth pain.  Needs close follow up with PCP   Discharge Diagnoses:  Principal Problem:   Hyponatremia Active Problems:   Hypertension   Chest pain   CAD (coronary artery disease)   Type 2 diabetes mellitus [facility])    Discharge Instructions  Discharge Instructions    Diet - low sodium heart healthy   Complete by: As directed    Increase activity slowly   Complete by: As directed      Allergies as of 02/24/2020   No Known Allergies     Medication List    STOP taking these medications   amLODipine 10 MG tablet Commonly known as: NORVASC   cilostazol 100 MG tablet Commonly known as: PLETAL   hydrochlorothiazide 25 MG tablet Commonly known as: HYDRODIURIL   ibuprofen 400 MG tablet Commonly known as: ADVIL   losartan 100 MG tablet Commonly known as: COZAAR   metFORMIN 500 MG tablet Commonly known as: GLUCOPHAGE   mupirocin ointment 2 % Commonly known as: Bactroban  TAKE these medications   amoxicillin-clavulanate 500-125 MG tablet Commonly known as: AUGMENTIN Take 1 tablet (500 mg total) by mouth 2 (two) times daily for 7 days.   antiseptic oral rinse Liqd 15 mLs by Mouth Rinse route as needed for dry mouth.   aspirin 81 MG tablet Take 81 mg by mouth daily.   diclofenac  Sodium 1 % Gel Commonly known as: VOLTAREN Apply 1 application topically 4 (four) times daily.   glipiZIDE 10 MG tablet Commonly known as: GLUCOTROL Take 1 tablet by mouth daily.   metoprolol tartrate 25 MG tablet Commonly known as: LOPRESSOR Take 0.5 tablets (12.5 mg total) by mouth 2 (two) times daily.   pantoprazole 40 MG tablet Commonly known as: PROTONIX Take 1 tablet (40 mg total) by mouth daily. Start taking on: February 25, 2020   phenol 1.4 % Liqd Commonly known as: CHLORASEPTIC Use as directed 1 spray in the mouth or throat as needed for throat irritation / pain.   pravastatin 80 MG tablet Commonly known as: PRAVACHOL Take 80 mg by mouth daily.   traMADol 50 MG tablet Commonly known as: ULTRAM Take 1 tablet (50 mg total) by mouth every 6 (six) hours as needed for moderate pain.       Follow-up Information    Locklear, Darryl, DMD Follow up in 1 day(s).   Specialty: Dentistry Contact information: [place] [address] [phone]        [name], MD Follow up in 1 week(s).   Specialty: Internal Medicine Contact information: [place] [address] [phone]              No Known Allergies  Consultations:  Dentist over phone   Procedures/Studies: DG Orthopantogram  Result Date: 02/23/2020 CLINICAL DATA:  Tooth pain EXAM: ORTHOPANTOGRAM/PANORAMIC COMPARISON:  None. FINDINGS: Multiple teeth are missing. There is a periapical lucency involving the approximate right lower cuspid. There is also a periapical lucency involving approximate right lower molar. IMPRESSION: Right lower cuspid and right lower molar periapical lucencies compatible with periapical abscesses. Electronically Signed   By: Kerby Moors M.D.   On: 02/23/2020 09:57   DG Chest Port 1 View  Result Date: 02/22/2020 CLINICAL DATA:  Chest pain EXAM: PORTABLE CHEST 1 VIEW COMPARISON:  October 13, 2013 FINDINGS: The heart size and mediastinal  contours are stable. The heart size is enlarged. Both lungs are clear. The visualized skeletal structures are stable. IMPRESSION: No active disease. Electronically Signed   By: Abelardo Diesel M.D.   On: 02/22/2020 09:01   ECHOCARDIOGRAM COMPLETE  Result Date: 02/23/2020    ECHOCARDIOGRAM REPORT   Patient Name:   Amanda [name] Date of Exam: 02/23/2020 Medical Rec #:  [id number]         Height:       55.0 in Accession #:    [id number]        Weight:       118.6 lb Date of Birth:  1946-01-21         BSA:          1.403 m Patient Age:    74 years          BP:           137/64 mmHg Patient Gender: F                 HR:           78 bpm. Exam Location:  Inpatient Procedure: 2D Echo, Cardiac  Doppler and Color Doppler Indications:    Chest pain  History:        Patient has prior history of Echocardiogram examinations, most                 recent 10/05/2011. CAD, Signs/Symptoms:Chest Pain; Risk                 Factors:Hypertension, Diabetes and Dyslipidemia.  Sonographer:    Dustin Flock Referring Phys: [id number] Iliani Vejar A Bryana Froemming IMPRESSIONS  1. Left ventricular ejection fraction, by estimation, is 55 to 60%. The left ventricle has normal function. The left ventricle has no regional wall motion abnormalities. There is moderate asymmetric left ventricular hypertrophy of the basal-septal segment. Left ventricular diastolic parameters were normal.  2. Right ventricular systolic function is normal. The right ventricular size is normal. There is normal pulmonary artery systolic pressure.  3. The mitral valve is normal in structure. Moderate mitral valve regurgitation. No evidence of mitral stenosis.  4. The aortic valve is grossly normal. There is moderate calcification of the aortic valve. There is mild thickening of the aortic valve. Aortic valve regurgitation is trivial. Mild to moderate aortic valve sclerosis/calcification is present, without any evidence of aortic stenosis.  5. The inferior vena cava is normal in size  with greater than 50% respiratory variability, suggesting right atrial pressure of 3 mmHg. Comparison(s): No significant change from prior study. Conclusion(s)/Recommendation(s): Otherwise normal echocardiogram, with minor abnormalities described in the report. FINDINGS  Left Ventricle: Left ventricular ejection fraction, by estimation, is 55 to 60%. The left ventricle has normal function. The left ventricle has no regional wall motion abnormalities. The left ventricular internal cavity size was normal in size. There is  moderate asymmetric left ventricular hypertrophy of the basal-septal segment. Left ventricular diastolic parameters were normal. Right Ventricle: The right ventricular size is normal. No increase in right ventricular wall thickness. Right ventricular systolic function is normal. There is normal pulmonary artery systolic pressure. The tricuspid regurgitant velocity is 2.30 m/s, and  with an assumed right atrial pressure of 3 mmHg, the estimated right ventricular systolic pressure is 38.7 mmHg. Left Atrium: Left atrial size was normal in size. Right Atrium: Right atrial size was normal in size. Pericardium: There is no evidence of pericardial effusion. Mitral Valve: The mitral valve is normal in structure. There is mild thickening of the mitral valve leaflet(s). There is mild calcification of the mitral valve leaflet(s). Moderate mitral valve regurgitation. No evidence of mitral valve stenosis. Tricuspid Valve: The tricuspid valve is normal in structure. Tricuspid valve regurgitation is mild. Aortic Valve: The aortic valve is grossly normal. There is moderate calcification of the aortic valve. There is mild thickening of the aortic valve. Aortic valve regurgitation is trivial. Mild to moderate aortic valve sclerosis/calcification is present, without any evidence of aortic stenosis. Aortic valve mean gradient measures 9.0 mmHg. Aortic valve peak gradient measures 13.5 mmHg. Aortic valve area, by VTI  measures 1.07 cm. Pulmonic Valve: The pulmonic valve was not well visualized. Pulmonic valve regurgitation is not visualized. Aorta: The aortic root and ascending aorta are structurally normal, with no evidence of dilitation. Venous: The inferior vena cava is normal in size with greater than 50% respiratory variability, suggesting right atrial pressure of 3 mmHg. IAS/Shunts: The atrial septum is grossly normal.  LEFT VENTRICLE PLAX 2D LVIDd:         3.00 cm  Diastology LVIDs:         2.20 cm  LV e' medial:  6.85 cm/s LV PW:         1.30 cm  LV E/e' medial:  8.7 LV IVS:        1.50 cm  LV e' lateral:   9.14 cm/s LVOT diam:     2.00 cm  LV E/e' lateral: 6.5 LV SV:         46 LV SV Index:   33 LVOT Area:     3.14 cm  RIGHT VENTRICLE RV Basal diam:  2.30 cm RV S prime:     9.57 cm/s TAPSE (M-mode): 1.9 cm LEFT ATRIUM             Index       RIGHT ATRIUM           Index LA diam:        3.30 cm 2.35 cm/m  RA Area:     10.30 cm LA Vol (A2C):   30.0 ml 21.38 ml/m RA Volume:   20.20 ml  14.39 ml/m LA Vol (A4C):   27.9 ml 19.88 ml/m LA Biplane Vol: 30.9 ml 22.02 ml/m  AORTIC VALVE AV Area (Vmax):    1.21 cm AV Area (Vmean):   1.05 cm AV Area (VTI):     1.07 cm AV Vmax:           184.00 cm/s AV Vmean:          139.000 cm/s AV VTI:            0.427 m AV Peak Grad:      13.5 mmHg AV Mean Grad:      9.0 mmHg LVOT Vmax:         70.90 cm/s LVOT Vmean:        46.300 cm/s LVOT VTI:          0.146 m LVOT/AV VTI ratio: 0.34  AORTA Ao Root diam: 2.90 cm MITRAL VALVE               TRICUSPID VALVE MV Area (PHT): 3.03 cm    TR Peak grad:   21.2 mmHg MV Decel Time: 250 msec    TR Vmax:        230.00 cm/s MV E velocity: 59.70 cm/s MV A velocity: 97.00 cm/s  SHUNTS MV E/A ratio:  0.62        Systemic VTI:  0.15 m                            Systemic Diam: 2.00 cm Buford Dresser MD Electronically signed by Buford Dresser MD Signature Date/Time: 02/23/2020/7:13:25 PM    Final      Subjective: Son help with  translation. Patient is alert, conversant, she report tooth pain, pain when she swallow her saliva not when she swallow food.  She denies cough, dysuria. No diarrhea.   Discharge Exam: Vitals:   02/24/20 0904 02/24/20 1033  BP: 119/61   Pulse: 86   Resp: 18   Temp: 98.2 F (36.8 C) 99.1 F (37.3 C)  SpO2: 96%      General: Pt is alert, awake, not in acute distress Cardiovascular: RRR, S1/S2 +, no rubs, no gallops Respiratory: CTA bilaterally, no wheezing, no rhonchi Abdominal: Soft, NT, ND, bowel sounds + Extremities: no edema, no cyanosis    The results of significant diagnostics from this hospitalization (including imaging, microbiology, ancillary and laboratory) are listed below for reference.     Microbiology: Recent Results (from the past  240 hour(s))  Respiratory Panel by RT PCR (Flu A&B, Covid) - Nasopharyngeal Swab     Status: None   Collection Time: 02/22/20  7:20 AM   Specimen: Nasopharyngeal Swab  Result Value Ref Range Status   SARS Coronavirus 2 by RT PCR NEGATIVE NEGATIVE Final    Comment: (NOTE) SARS-CoV-2 target nucleic acids are NOT DETECTED.  The SARS-CoV-2 RNA is generally detectable in upper respiratoy specimens during the acute phase of infection. The lowest concentration of SARS-CoV-2 viral copies this assay can detect is 131 copies/mL. A negative result does not preclude SARS-Cov-2 infection and should not be used as the sole basis for treatment or other patient management decisions. A negative result may occur with  improper specimen collection/handling, submission of specimen other than nasopharyngeal swab, presence of viral mutation(s) within the areas targeted by this assay, and inadequate number of viral copies (<131 copies/mL). A negative result must be combined with clinical observations, patient history, and epidemiological information. The expected result is Negative.  Fact Sheet for Patients:   PinkCheek.be  Fact Sheet for Healthcare Providers:  GravelBags.it  This test is no t yet approved or cleared by the [place] FDA and  has been authorized for detection and/or diagnosis of SARS-CoV-2 by FDA under an Emergency Use Authorization (EUA). This EUA will remain  in effect (meaning this test can be used) for the duration of the COVID-19 declaration under Section 564(b)(1) of the Act, 21 U.S.C. section 360bbb-3(b)(1), unless the authorization is terminated or revoked sooner.     Influenza A by PCR NEGATIVE NEGATIVE Final   Influenza B by PCR NEGATIVE NEGATIVE Final    Comment: (NOTE) The Xpert Xpress SARS-CoV-2/FLU/RSV assay is intended as an aid in  the diagnosis of influenza from Nasopharyngeal swab specimens and  should not be used as a sole basis for treatment. Nasal washings and  aspirates are unacceptable for Xpert Xpress SARS-CoV-2/FLU/RSV  testing.  Fact Sheet for Patients: PinkCheek.be  Fact Sheet for Healthcare Providers: GravelBags.it  This test is not yet approved or cleared by the [place] FDA and  has been authorized for detection and/or diagnosis of SARS-CoV-2 by  FDA under an Emergency Use Authorization (EUA). This EUA will remain  in effect (meaning this test can be used) for the duration of the  Covid-19 declaration under Section 564(b)(1) of the Act, 21  U.S.C. section 360bbb-3(b)(1), unless the authorization is  terminated or revoked. Performed at [facility], [place] [address], [place], [place] [address]      Labs: BNP (last 3 results) No results for input(s): BNP in the last 8760 hours. Basic Metabolic Panel: Recent Labs  Lab 02/22/20 1810 02/23/20 0118 02/23/20 0734 02/23/20 1831 02/24/20 0304  NA 126* 126* 127* 126* 133*  K 4.4 4.3 4.2 4.2 3.9  CL 91* 91* 92* 91* 99  CO2 23 25 23 23 24   GLUCOSE 248*  238* 264* 210* 144*  BUN 15 16 19 21 17   CREATININE 1.17* 1.22* 1.41* 1.47* 1.51*  CALCIUM 9.5 9.4 9.3 9.6 9.3  MG  --   --  1.8  --   --    Liver Function Tests: Recent Labs  Lab 02/24/20 0304  AST 20  ALT 18  ALKPHOS 70  BILITOT 0.7  PROT 7.0  ALBUMIN 3.3*   No results for input(s): LIPASE, AMYLASE in the last 168 hours. No results for input(s): AMMONIA in the last 168 hours. CBC: Recent Labs  Lab 02/22/20 0820 02/22/20 1510  02/23/20 0734 02/24/20 1047  WBC 8.2 8.9 8.8 8.1  NEUTROABS 6.0  --   --   --   HGB 10.7* 11.6* 11.0* 10.6*  HCT 32.3* 35.1* 33.3* 32.4*  MCV 75.1* 76.6* 77.4* 78.1*  PLT 202 210 190 210   Cardiac Enzymes: No results for input(s): CKTOTAL, CKMB, CKMBINDEX, TROPONINI in the last 168 hours. BNP: Invalid input(s): POCBNP CBG: Recent Labs  Lab 02/23/20 1141 02/23/20 1646 02/23/20 2026 02/24/20 0638 02/24/20 1124  GLUCAP 243* 236* 207* 136* 205*   D-Dimer No results for input(s): DDIMER in the last 72 hours. Hgb A1c Recent Labs    02/22/20 1510  HGBA1C 9.9*   Lipid Profile No results for input(s): CHOL, HDL, LDLCALC, TRIG, CHOLHDL, LDLDIRECT in the last 72 hours. Thyroid function studies No results for input(s): TSH, T4TOTAL, T3FREE, THYROIDAB in the last 72 hours.  Invalid input(s): FREET3 Anemia work up No results for input(s): VITAMINB12, FOLATE, FERRITIN, TIBC, IRON, RETICCTPCT in the last 72 hours. Urinalysis    Component Value Date/Time   COLORURINE STRAW (A) 02/22/2020 0932   APPEARANCEUR CLEAR 02/22/2020 0932   LABSPEC 1.002 (L) 02/22/2020 0932   PHURINE 7.0 02/22/2020 0932   GLUCOSEU >=500 (A) 02/22/2020 0932   HGBUR SMALL (A) 02/22/2020 0932   HGBUR negative 04/26/2010 0858   BILIRUBINUR NEGATIVE 02/22/2020 0932   KETONESUR NEGATIVE 02/22/2020 0932   PROTEINUR NEGATIVE 02/22/2020 0932   UROBILINOGEN 0.2 10/05/2011 0018   NITRITE NEGATIVE 02/22/2020 0932   LEUKOCYTESUR NEGATIVE 02/22/2020 0932   Sepsis  Labs Invalid input(s): PROCALCITONIN,  WBC,  LACTICIDVEN Microbiology Recent Results (from the past 240 hour(s))  Respiratory Panel by RT PCR (Flu A&B, Covid) - Nasopharyngeal Swab     Status: None   Collection Time: 02/22/20  7:20 AM   Specimen: Nasopharyngeal Swab  Result Value Ref Range Status   SARS Coronavirus 2 by RT PCR NEGATIVE NEGATIVE Final    Comment: (NOTE) SARS-CoV-2 target nucleic acids are NOT DETECTED.  The SARS-CoV-2 RNA is generally detectable in upper respiratoy specimens during the acute phase of infection. The lowest concentration of SARS-CoV-2 viral copies this assay can detect is 131 copies/mL. A negative result does not preclude SARS-Cov-2 infection and should not be used as the sole basis for treatment or other patient management decisions. A negative result may occur with  improper specimen collection/handling, submission of specimen other than nasopharyngeal swab, presence of viral mutation(s) within the areas targeted by this assay, and inadequate number of viral copies (<131 copies/mL). A negative result must be combined with clinical observations, patient history, and epidemiological information. The expected result is Negative.  Fact Sheet for Patients:  PinkCheek.be  Fact Sheet for Healthcare Providers:  GravelBags.it  This test is no t yet approved or cleared by the [place] FDA and  has been authorized for detection and/or diagnosis of SARS-CoV-2 by FDA under an Emergency Use Authorization (EUA). This EUA will remain  in effect (meaning this test can be used) for the duration of the COVID-19 declaration under Section 564(b)(1) of the Act, 21 U.S.C. section 360bbb-3(b)(1), unless the authorization is terminated or revoked sooner.     Influenza A by PCR NEGATIVE NEGATIVE Final   Influenza B by PCR NEGATIVE NEGATIVE Final    Comment: (NOTE) The Xpert Xpress SARS-CoV-2/FLU/RSV assay  is intended as an aid in  the diagnosis of influenza from Nasopharyngeal swab specimens and  should not be used as a sole basis for treatment. Nasal washings and  aspirates  are unacceptable for Xpert Xpress SARS-CoV-2/FLU/RSV  testing.  Fact Sheet for Patients: PinkCheek.be  Fact Sheet for Healthcare Providers: GravelBags.it  This test is not yet approved or cleared by the [place] FDA and  has been authorized for detection and/or diagnosis of SARS-CoV-2 by  FDA under an Emergency Use Authorization (EUA). This EUA will remain  in effect (meaning this test can be used) for the duration of the  Covid-19 declaration under Section 564(b)(1) of the Act, 21  U.S.C. section 360bbb-3(b)(1), unless the authorization is  terminated or revoked. Performed at [facility], [place] [address] [place], [place] [address]      Time coordinating discharge: 40 minutes  SIGNED:   Elmarie Shiley, MD  [facility]

## 2020-02-24 NOTE — Evaluation (Signed)
Occupational Therapy Evaluation Patient Details Name: Amanda [name] MRN: [id number] DOB: [birth year] Today's Date: 02/24/2020    History of Present Illness 74yo female with increasing fatigue and c/o chest pain, also dental pain. Admitted for hyponatremia. PMH chest pain, DM, HLD, HTN, CABG   Clinical Impression   Patient supine in bed, called son for translation due to language not found in stratus.  Home setup and PLOF received from son, he reports she is independent for ADLs and limited mobility, using cane as needed (mostly when leaving home).  Admitted for above and presenting near baseline modified independent level with ADLs, mobility. Discussed plan of no further OT with son, who relayed to patient; they are in agreement.  Based on performance today, OT will sign off.  No further OT needs identified.     Follow Up Recommendations  No OT follow up    Equipment Recommendations  None recommended by OT    Recommendations for Other Services       Precautions / Restrictions Precautions Precautions: None Restrictions Weight Bearing Restrictions: No      Mobility Bed Mobility Overal bed mobility: Independent                Transfers Overall transfer level: Independent               General transfer comment: no physical assist given, no signs of LOB or unsteadiness    Balance Overall balance assessment: No apparent balance deficits (not formally assessed)                                         ADL either performed or assessed with clinical judgement   ADL Overall ADL's : Modified independent                                       General ADL Comments: simulated ADLs with modified independence, no assist required     Vision         Perception     Praxis      Pertinent Vitals/Pain Pain Assessment: Faces Faces Pain Scale: No hurt Pain Intervention(s): Monitored during session     Hand Dominance      Extremity/Trunk Assessment Upper Extremity Assessment Upper Extremity Assessment: Overall WFL for tasks assessed   Lower Extremity Assessment Lower Extremity Assessment: Defer to PT evaluation   Cervical / Trunk Assessment Cervical / Trunk Assessment: Normal   Communication Communication Communication: Prefers language other than English (called son who interpreted)   Cognition Arousal/Alertness: Awake/alert Behavior During Therapy: WFL for tasks assessed/performed Overall Cognitive Status: Within Functional Limits for tasks assessed                                 General Comments: appears WFL, language barrier but understood son on phone    General Comments       Exercises     Shoulder Instructions      Home Living Family/patient expects to be discharged to:: Private residence Living Arrangements: Children Available Help at Discharge: Family;Available 24 hours/day Type of Home: House Home Access: Stairs to enter [employer] of Steps: 4 Entrance Stairs-Rails:  (+ rail) Home Layout: Two level;Able to live on main level with bedroom/bathroom;Full bath on main  level     Bathroom Shower/Tub: Occupational psychologist: Standard     Home Equipment: Cane - single point          Prior Functioning/Environment Level of Independence: Independent                 OT Problem List:        OT Treatment/Interventions:      OT Goals(Current goals can be found in the care plan section) Acute Rehab OT Goals Patient Stated Goal: return home when medically ready OT Goal Formulation: With family  OT Frequency:     Barriers to D/C:            Co-evaluation PT/OT/SLP Co-Evaluation/Treatment: Yes Reason for Co-Treatment: Other (comment) (language barrier )   OT goals addressed during session: ADL's and self-care      AM-PAC OT "6 Clicks" Daily Activity     Outcome Measure Help from another person eating meals?: None Help from  another person taking care of personal grooming?: None Help from another person toileting, which includes using toliet, bedpan, or urinal?: None Help from another person bathing (including washing, rinsing, drying)?: None Help from another person to put on and taking off regular upper body clothing?: None Help from another person to put on and taking off regular lower body clothing?: None 6 Click Score: 24   End of Session Nurse Communication: Mobility status  Activity Tolerance: Patient tolerated treatment well Patient left: in chair;with call bell/phone within reach  OT Visit Diagnosis: Muscle weakness (generalized) (M62.81)                Time: 1114-1140 OT Time Calculation (min): 26 min Charges:  OT General Charges $OT Visit: 1 Visit OT Evaluation $OT Eval Low Complexity: 1 Low  Jolaine Artist, OT Acute Rehabilitation Services Pager [phone] Office [phone]   Amanda [name] 02/24/2020, 12:40 PM

## 2020-02-24 NOTE — Evaluation (Signed)
Physical Therapy Evaluation Patient Details Name: Amanda [name] MRN: [id number] DOB: 05-09-1946 Today's Date: 02/24/2020   History of Present Illness  74yo female with increasing fatigue and c/o chest pain, also dental pain. Admitted for hyponatremia. PMH chest pain, DM, HLD, HTN, CABG  Clinical Impression   Patient received in bed; called son over room phone to act as interpreter as her formal language was not available via stratus tablet. Seemed cognitively intact and followed visual cues well. Independent with all mobility with no significant deficits in gait/balance noted, patient does not seem to be concerned regarding mobility either. Left up in recliner with all needs met this morning, son/patient aware of PT plan. PT signing off as patient is at baseline level of mobility and not in need of skilled therapy services- thank you for the opportunity to participate in her care!     Follow Up Recommendations No PT follow up    Equipment Recommendations  None recommended by PT    Recommendations for Other Services       Precautions / Restrictions Precautions Precautions: None Restrictions Weight Bearing Restrictions: No      Mobility  Bed Mobility Overal bed mobility: Independent                Transfers Overall transfer level: Independent               General transfer comment: no physical assist given, no signs of LOB or unsteadiness  Ambulation/Gait Ambulation/Gait assistance: Independent Gait Distance (Feet): 200 Feet Assistive device: None Gait Pattern/deviations: WFL(Within Functional Limits);Step-through pattern Gait velocity: decreased   General Gait Details: no significant gait or balance deficits noted  Stairs            Wheelchair Mobility    Modified Rankin (Stroke Patients Only)       Balance Overall balance assessment: No apparent balance deficits (not formally assessed)                                            Pertinent Vitals/Pain Pain Assessment: Faces Faces Pain Scale: No hurt Pain Intervention(s): Monitored during session    Home Living Family/patient expects to be discharged to:: Private residence Living Arrangements: Children Available Help at Discharge: Family;Available 24 hours/day Type of Home: House Home Access: Stairs to enter Entrance Stairs-Rails:  (rail) [employer] of Steps: 4 Home Layout: Two level;Able to live on main level with bedroom/bathroom;Full bath on main level Home Equipment: Cane - single point      Prior Function Level of Independence: Independent               Hand Dominance        Extremity/Trunk Assessment   Upper Extremity Assessment Upper Extremity Assessment: Defer to OT evaluation    Lower Extremity Assessment Lower Extremity Assessment: Overall WFL for tasks assessed    Cervical / Trunk Assessment Cervical / Trunk Assessment: Normal  Communication   Communication: Prefers language other than English (called son who interpreted )  Cognition Arousal/Alertness: Awake/alert Behavior During Therapy: WFL for tasks assessed/performed Overall Cognitive Status: Within Functional Limits for tasks assessed                                 General Comments: followed visual cues well      General Comments  Exercises     Assessment/Plan    PT Assessment Patent does not need any further PT services  PT Problem List         PT Treatment Interventions      PT Goals (Current goals can be found in the Care Plan section)  Acute Rehab PT Goals Patient Stated Goal: return home when medically ready PT Goal Formulation: With family Time For Goal Achievement: 03/09/20 Potential to Achieve Goals: Good    Frequency     Barriers to discharge        Co-evaluation   Reason for Co-Treatment: Other (comment) (language barrier )   OT goals addressed during session: ADL's and self-care        AM-PAC PT "6 Clicks" Mobility  Outcome Measure Help needed turning from your back to your side while in a flat bed without using bedrails?: None Help needed moving from lying on your back to sitting on the side of a flat bed without using bedrails?: None Help needed moving to and from a bed to a chair (including a wheelchair)?: None Help needed standing up from a chair using your arms (e.g., wheelchair or bedside chair)?: None Help needed to walk in hospital room?: None Help needed climbing 3-5 steps with a railing? : A Little 6 Click Score: 23    End of Session   Activity Tolerance: Patient tolerated treatment well Patient left: in chair;with call bell/phone within reach Nurse Communication: Mobility status PT Visit Diagnosis: Muscle weakness (generalized) (M62.81)    Time: 2947-6546 PT Time Calculation (min) (ACUTE ONLY): 26 min   Charges:   PT Evaluation $PT Eval Low Complexity: 1 Low (co-eval with OT)          Windell Norfolk, DPT, PN1   Supplemental Physical Therapist [place]    Pager [phone] Acute Rehab Office [phone]

## 2020-02-24 NOTE — Progress Notes (Signed)
DISCHARGE NOTE HOME Amanda [name] to be discharged Home   per MD order. Discussed prescriptions and follow up appointments with the patient. Prescriptions given to patient; medication list explained in detail. Patient verbalized understanding.  Skin clean, dry and intact without evidence of skin break down, no evidence of skin tears noted. IV catheter discontinued intact. Site without signs and symptoms of complications. Dressing and pressure applied. Pt denies pain at the site currently. No complaints noted.  Patient free of lines, drains, and wounds.   An After Visit Summary (AVS) was printed and given to the patient. Patient escorted via wheelchair, and discharged home via private auto.  Dolores Hoose, RN

## 2020-03-19 ENCOUNTER — Inpatient Hospital Stay (HOSPITAL_COMMUNITY): Payer: Medicare Other

## 2020-03-19 ENCOUNTER — Emergency Department (HOSPITAL_COMMUNITY): Payer: Medicare Other

## 2020-03-19 ENCOUNTER — Inpatient Hospital Stay (HOSPITAL_COMMUNITY)
Admission: EM | Admit: 2020-03-19 | Discharge: 2020-03-26 | DRG: 637 | Disposition: A | Discharge status: Home Health | Payer: Medicare Other | Attending: Internal Medicine | Admitting: Internal Medicine

## 2020-03-19 DIAGNOSIS — N179 Acute kidney failure, unspecified: Secondary | ICD-10-CM | POA: Diagnosis present

## 2020-03-19 DIAGNOSIS — E785 Hyperlipidemia, unspecified: Secondary | ICD-10-CM | POA: Diagnosis present

## 2020-03-19 DIAGNOSIS — J189 Pneumonia, unspecified organism: Secondary | ICD-10-CM | POA: Diagnosis present

## 2020-03-19 DIAGNOSIS — Z789 Other specified health status: Secondary | ICD-10-CM

## 2020-03-19 DIAGNOSIS — G8918 Other acute postprocedural pain: Secondary | ICD-10-CM

## 2020-03-19 DIAGNOSIS — R7989 Other specified abnormal findings of blood chemistry: Secondary | ICD-10-CM

## 2020-03-19 DIAGNOSIS — E871 Hypo-osmolality and hyponatremia: Secondary | ICD-10-CM | POA: Diagnosis present

## 2020-03-19 DIAGNOSIS — R4182 Altered mental status, unspecified: Secondary | ICD-10-CM | POA: Diagnosis present

## 2020-03-19 DIAGNOSIS — E1110 Type 2 diabetes mellitus with ketoacidosis without coma: Secondary | ICD-10-CM | POA: Diagnosis present

## 2020-03-19 DIAGNOSIS — I110 Hypertensive heart disease with heart failure: Secondary | ICD-10-CM | POA: Diagnosis present

## 2020-03-19 DIAGNOSIS — K8000 Calculus of gallbladder with acute cholecystitis without obstruction: Secondary | ICD-10-CM | POA: Diagnosis present

## 2020-03-19 DIAGNOSIS — I5033 Acute on chronic diastolic (congestive) heart failure: Secondary | ICD-10-CM | POA: Diagnosis present

## 2020-03-19 DIAGNOSIS — J9621 Acute and chronic respiratory failure with hypoxia: Secondary | ICD-10-CM | POA: Diagnosis present

## 2020-03-19 DIAGNOSIS — I2510 Atherosclerotic heart disease of native coronary artery without angina pectoris: Secondary | ICD-10-CM | POA: Diagnosis present

## 2020-03-19 DIAGNOSIS — J9601 Acute respiratory failure with hypoxia: Secondary | ICD-10-CM | POA: Diagnosis not present

## 2020-03-19 DIAGNOSIS — E875 Hyperkalemia: Secondary | ICD-10-CM | POA: Diagnosis not present

## 2020-03-19 DIAGNOSIS — I700 Atherosclerosis of aorta: Secondary | ICD-10-CM

## 2020-03-19 DIAGNOSIS — Z20822 Contact with and (suspected) exposure to covid-19: Secondary | ICD-10-CM | POA: Diagnosis present

## 2020-03-19 DIAGNOSIS — Z79899 Other long term (current) drug therapy: Secondary | ICD-10-CM

## 2020-03-19 DIAGNOSIS — K219 Gastro-esophageal reflux disease without esophagitis: Secondary | ICD-10-CM | POA: Diagnosis present

## 2020-03-19 DIAGNOSIS — D509 Iron deficiency anemia, unspecified: Secondary | ICD-10-CM | POA: Diagnosis present

## 2020-03-19 DIAGNOSIS — S36119A Unspecified injury of liver, initial encounter: Secondary | ICD-10-CM | POA: Diagnosis present

## 2020-03-19 DIAGNOSIS — Z951 Presence of aortocoronary bypass graft: Secondary | ICD-10-CM

## 2020-03-19 DIAGNOSIS — Z7982 Long term (current) use of aspirin: Secondary | ICD-10-CM | POA: Diagnosis not present

## 2020-03-19 DIAGNOSIS — R140 Abdominal distension (gaseous): Secondary | ICD-10-CM

## 2020-03-19 DIAGNOSIS — E876 Hypokalemia: Secondary | ICD-10-CM | POA: Diagnosis not present

## 2020-03-19 DIAGNOSIS — J90 Pleural effusion, not elsewhere classified: Secondary | ICD-10-CM

## 2020-03-19 LAB — CBG MONITORING, ED
Glucose-Capillary: 407 mg/dL — ABNORMAL HIGH (ref 70–99)
Glucose-Capillary: 388 mg/dL — ABNORMAL HIGH (ref 70–99)
Glucose-Capillary: 335 mg/dL — ABNORMAL HIGH (ref 70–99)

## 2020-03-19 LAB — BASIC METABOLIC PANEL
Anion gap: 21 — ABNORMAL HIGH (ref 5–15)
BUN: 27 mg/dL — ABNORMAL HIGH (ref 8–23)
CO2: 13 mmol/L — ABNORMAL LOW (ref 22–32)
Calcium: 9.3 mg/dL (ref 8.9–10.3)
Chloride: 82 mmol/L — ABNORMAL LOW (ref 98–111)
Creatinine, Ser: 1.66 mg/dL — ABNORMAL HIGH (ref 0.44–1.00)
GFR, Estimated: 32 mL/min — ABNORMAL LOW (ref >60)
Glucose, Bld: 400 mg/dL — ABNORMAL HIGH (ref 70–99)
Potassium: 4.1 mmol/L (ref 3.5–5.1)
Sodium: 116 mmol/L — CL (ref 135–145)

## 2020-03-19 LAB — I-STAT ARTERIAL BLOOD GAS, ED
Acid-base deficit: 8 mmol/L — ABNORMAL HIGH (ref 0.0–2.0)
Bicarbonate: 17.2 mmol/L — ABNORMAL LOW (ref 20.0–28.0)
Calcium, Ion: 1.14 mmol/L — ABNORMAL LOW (ref 1.15–1.40)
HCT: 31 % — ABNORMAL LOW (ref 36.0–46.0)
Hemoglobin: 10.5 g/dL — ABNORMAL LOW (ref 12.0–15.0)
O2 Saturation: 70 %
Potassium: 4.1 mmol/L (ref 3.5–5.1)
Sodium: 117 mmol/L — CL (ref 135–145)
TCO2: 18 mmol/L — ABNORMAL LOW (ref 22–32)
pCO2 arterial: 34.4 mmHg (ref 32.0–48.0)
pH, Arterial: 7.307 — ABNORMAL LOW (ref 7.350–7.450)
pO2, Arterial: 40 mmHg — CL (ref 83.0–108.0)

## 2020-03-19 LAB — RETICULOCYTES
Immature Retic Fract: 21.7 % — ABNORMAL HIGH (ref 2.3–15.9)
RBC.: 3.78 MIL/uL — ABNORMAL LOW (ref 3.87–5.11)
Retic Count, Absolute: 172.4 10*3/uL (ref 19.0–186.0)
Retic Ct Pct: 4.6 % — ABNORMAL HIGH (ref 0.4–3.1)

## 2020-03-19 LAB — CBC WITH DIFFERENTIAL/PLATELET
Abs Immature Granulocytes: 0.03 10*3/uL (ref 0.00–0.07)
Basophils Absolute: 0 10*3/uL (ref 0.0–0.1)
Basophils Relative: 0 %
Eosinophils Absolute: 0 10*3/uL (ref 0.0–0.5)
Eosinophils Relative: 0 %
HCT: 29.5 % — ABNORMAL LOW (ref 36.0–46.0)
Hemoglobin: 9.6 g/dL — ABNORMAL LOW (ref 12.0–15.0)
Immature Granulocytes: 0 %
Lymphocytes Relative: 13 %
Lymphs Abs: 1 10*3/uL (ref 0.7–4.0)
MCH: 25.8 pg — ABNORMAL LOW (ref 26.0–34.0)
MCHC: 32.5 g/dL (ref 30.0–36.0)
MCV: 79.3 fL — ABNORMAL LOW (ref 80.0–100.0)
Monocytes Absolute: 0.4 10*3/uL (ref 0.1–1.0)
Monocytes Relative: 6 %
Neutro Abs: 6 10*3/uL (ref 1.7–7.7)
Neutrophils Relative %: 81 %
Platelets: 199 10*3/uL (ref 150–400)
RBC: 3.72 MIL/uL — ABNORMAL LOW (ref 3.87–5.11)
RDW: 12.3 % (ref 11.5–15.5)
WBC: 7.4 10*3/uL (ref 4.0–10.5)
nRBC: 0 % (ref 0.0–0.2)

## 2020-03-19 LAB — COMPREHENSIVE METABOLIC PANEL
ALT: 247 U/L — ABNORMAL HIGH (ref 0–44)
AST: 262 U/L — ABNORMAL HIGH (ref 15–41)
Albumin: 4.2 g/dL (ref 3.5–5.0)
Alkaline Phosphatase: 99 U/L (ref 38–126)
Anion gap: 18 — ABNORMAL HIGH (ref 5–15)
BUN: 25 mg/dL — ABNORMAL HIGH (ref 8–23)
CO2: 14 mmol/L — ABNORMAL LOW (ref 22–32)
Calcium: 9.2 mg/dL (ref 8.9–10.3)
Chloride: 84 mmol/L — ABNORMAL LOW (ref 98–111)
Creatinine, Ser: 1.72 mg/dL — ABNORMAL HIGH (ref 0.44–1.00)
GFR, Estimated: 31 mL/min — ABNORMAL LOW (ref >60)
Glucose, Bld: 404 mg/dL — ABNORMAL HIGH (ref 70–99)
Potassium: 4.2 mmol/L (ref 3.5–5.1)
Sodium: 116 mmol/L — CL (ref 135–145)
Total Bilirubin: 1.1 mg/dL (ref 0.3–1.2)
Total Protein: 7.5 g/dL (ref 6.5–8.1)

## 2020-03-19 LAB — TSH: TSH: 1.635 u[IU]/mL (ref 0.350–4.500)

## 2020-03-19 LAB — RESPIRATORY PANEL BY RT PCR (FLU A&B, COVID)
Influenza A by PCR: NEGATIVE
Influenza B by PCR: NEGATIVE
SARS Coronavirus 2 by RT PCR: NEGATIVE

## 2020-03-19 LAB — IRON AND TIBC
Iron: 44 ug/dL (ref 28–170)
Saturation Ratios: 12 % (ref 10.4–31.8)
TIBC: 360 ug/dL (ref 250–450)
UIBC: 316 ug/dL

## 2020-03-19 LAB — AMMONIA: Ammonia: 31 umol/L (ref 9–35)

## 2020-03-19 LAB — FERRITIN: Ferritin: 99 ng/mL (ref 11–307)

## 2020-03-19 LAB — LACTIC ACID, PLASMA: Lactic Acid, Venous: 8.8 mmol/L (ref 0.5–1.9)

## 2020-03-19 LAB — TROPONIN I (HIGH SENSITIVITY)
Troponin I (High Sensitivity): 9 ng/L (ref <18)
Troponin I (High Sensitivity): 11 ng/L (ref <18)

## 2020-03-19 LAB — VITAMIN B12: Vitamin B-12: 806 pg/mL (ref 180–914)

## 2020-03-19 LAB — LIPASE, BLOOD: Lipase: 94 U/L — ABNORMAL HIGH (ref 11–51)

## 2020-03-19 LAB — BETA-HYDROXYBUTYRIC ACID: Beta-Hydroxybutyric Acid: 0.26 mmol/L (ref 0.05–0.27)

## 2020-03-19 LAB — FOLATE: Folate: 18.3 ng/mL (ref >5.9)

## 2020-03-19 MED ORDER — PIPERACILLIN-TAZOBACTAM 3.375 G IVPB 30 MIN: 3.3750 g | Freq: Once | INTRAVENOUS | Status: DC

## 2020-03-19 MED ORDER — METRONIDAZOLE IN NACL 5-0.79 MG/ML-% IV SOLN: 500.0000 mg | Freq: Once | INTRAVENOUS | Status: DC

## 2020-03-19 MED ORDER — FUROSEMIDE 10 MG/ML IJ SOLN
40.0000 mg | Freq: Four times a day (QID) | INTRAMUSCULAR | Status: AC
  Administered 2020-03-20 (×2): 40 mg via INTRAVENOUS
  Filled 2020-03-19 (×2): qty 4

## 2020-03-19 MED ORDER — SODIUM CHLORIDE 0.9 % IV SOLN: 1.0000 g | Freq: Once | INTRAVENOUS | Status: DC

## 2020-03-19 MED ORDER — DEXTROSE 50 % IV SOLN
0.0000 mL | INTRAVENOUS | Status: DC | PRN
  Filled 2020-03-19: qty 50

## 2020-03-19 MED ORDER — SODIUM CHLORIDE 0.9 % IV SOLN
1.0000 g | Freq: Once | INTRAVENOUS | Status: AC
  Administered 2020-03-19: 1 g via INTRAVENOUS
  Filled 2020-03-19: qty 10

## 2020-03-19 MED ORDER — INSULIN REGULAR(HUMAN) IN NACL 100-0.9 UT/100ML-% IV SOLN
INTRAVENOUS | Status: DC
  Administered 2020-03-20: 3.6 [IU]/h via INTRAVENOUS
  Filled 2020-03-19: qty 100

## 2020-03-19 MED ORDER — PIPERACILLIN-TAZOBACTAM 3.375 G IVPB
3.3750 g | Freq: Three times a day (TID) | INTRAVENOUS | Status: DC
  Administered 2020-03-20 – 2020-03-22 (×7): 3.375 g via INTRAVENOUS
  Filled 2020-03-19 (×8): qty 50

## 2020-03-19 MED ORDER — INSULIN REGULAR(HUMAN) IN NACL 100-0.9 UT/100ML-% IV SOLN: INTRAVENOUS | Status: DC

## 2020-03-19 MED ORDER — ALBUTEROL SULFATE HFA 108 (90 BASE) MCG/ACT IN AERS: 2.0000 | INHALATION_SPRAY | Freq: Once | RESPIRATORY_TRACT | Status: DC

## 2020-03-19 MED ORDER — POTASSIUM CHLORIDE 10 MEQ/100ML IV SOLN
10.0000 meq | INTRAVENOUS | Status: AC
  Administered 2020-03-19: 10 meq via INTRAVENOUS
  Filled 2020-03-19: qty 100

## 2020-03-19 MED ORDER — ALBUMIN HUMAN 25 % IV SOLN
25.0000 g | Freq: Four times a day (QID) | INTRAVENOUS | Status: AC
  Administered 2020-03-20 (×4): 25 g via INTRAVENOUS
  Filled 2020-03-19 (×6): qty 100

## 2020-03-19 MED ORDER — LACTATED RINGERS IV SOLN: INTRAVENOUS | Status: DC

## 2020-03-19 MED ORDER — DEXTROSE 50 % IV SOLN: 0.0000 mL | INTRAVENOUS | Status: DC | PRN

## 2020-03-19 MED ORDER — VANCOMYCIN HCL IN DEXTROSE 1-5 GM/200ML-% IV SOLN: 1000.0000 mg | Freq: Once | INTRAVENOUS | Status: DC

## 2020-03-19 MED ORDER — LACTATED RINGERS IV BOLUS
20.0000 mL/kg | Freq: Once | INTRAVENOUS | Status: AC
  Administered 2020-03-19: 1072 mL via INTRAVENOUS

## 2020-03-19 MED ORDER — PANTOPRAZOLE SODIUM 40 MG IV SOLR
40.0000 mg | INTRAVENOUS | Status: DC
  Administered 2020-03-20 – 2020-03-23 (×4): 40 mg via INTRAVENOUS
  Filled 2020-03-19 (×4): qty 40

## 2020-03-19 MED ORDER — SODIUM CHLORIDE 0.9 % IV SOLN
500.0000 mg | Freq: Once | INTRAVENOUS | Status: AC
  Administered 2020-03-19: 500 mg via INTRAVENOUS
  Filled 2020-03-19: qty 500

## 2020-03-19 MED ORDER — DEXTROSE IN LACTATED RINGERS 5 % IV SOLN: INTRAVENOUS | Status: DC

## 2020-03-19 NOTE — ED Notes (Signed)
Unable to finish triaging patient due to language barrier. No family around. Tried to call the son but he did not answer. Computer did not have her language. Used language line but according to the interpreter, the patient speak [name] (same as the interpreter) but from a different area. The interpreter was able to interpret some but was very difficult.

## 2020-03-19 NOTE — ED Notes (Addendum)
Critical Lab: Lactic 8.8  Dr. Hal Hope made aware.

## 2020-03-19 NOTE — Progress Notes (Signed)
ABG done. Appears to be venous. Caccavale, Sophia, PA-C notified

## 2020-03-19 NOTE — H&P (Signed)
NAME:  Amanda [name], MRN:  [id number], DOB:  [birth year], LOS: 0 ADMISSION DATE:  03/19/2020, CONSULTATION DATE:  03/19/20 REFERRING MD:  Tegeler, CHIEF COMPLAINT:  SOB   Brief History   74 year old woman presenting with acute hypoxemic respiratory failure and abdominal pain found to have signs of both fluid overload and acute cholecystitis.  History of present illness   74 year old non english speaking woman [place], unable to find interpreter for this) presenting with belly pain and SOB.  Workup in ER reveals acute on chronic hyponatremia, acute liver injury, acute kidney injury, lactic acidosis, and significant hyperglycemia.  [place] RUQ c/w cholecystitis.  Gen Surg called and recommends CT A/P for now, this is pending.  Patient had progressive dyspnea with fluid resuscitation so placed and BIPAP and PCCM consulted.  Past Medical History  HTN DM GERD  Significant Hospital Events   03/19/20 admitted  Consults:  General Surgery  Procedures:  N/A  Significant Diagnostic Tests:  CT A/P >> CXR pulmonary edema RUQ [place] cholecystitis changes  Micro Data:  COVID>>   Antimicrobials:  Zosyn>>   Interim history/subjective:  Consulted  Objective   Blood pressure 137/84, pulse 82, temperature 97.9 F (36.6 C), temperature source Oral, resp. rate (!) 30, height 5' (1.524 m), weight 53.6 kg, SpO2 100 %.       No intake or output data in the 24 hours ending 03/19/20 2324 Filed Weights   03/19/20 1900  Weight: 53.6 kg    Examination: Constitutional: elderly woman in no acute distress Eyes: eyes are anicteric, reactive to light Ears, nose, mouth, and throat: BIPAP in place with good seal Cardiovascular: heart sounds are regular, ext are warm to touch. trace edema Respiratory: Diminished at bases with crackles, no accessory muscle use Gastrointestinal: abdomen is soft, diffuse TTP Skin: No rashes, normal turgor Neurologic: moves all 4 ext, language barrier prohibits much of  further exam Psychiatric: same as above  Resolved Hospital Problem list     Assessment & Plan:  Acute hypoxemic respiratory failure due to pulmonary edema- trop neg, echo last month benign - Albumin/lasix - Strict I/O - BIPAP qHS, try to wean tomorrow AM - Repeat limited echo, check BNP  Acute cholecystitis - f/u CT A/P, gen surgery recs - zosyn  Acute on chronic kidney injury, mild - albumin/lasix, avoid nephrotoxins  Lactic acidosis related to severe sepsis, trend and f/u CT A/P  Acute on chronic hyponatremia likely acute phase reactant plus pseudohyponatremia related to hyperglycemia (corrected 123) - Goal corrected 120-130 for acute illness  DM with hyperglycemia, beta hydroxybutyrate neg - insulin gtt for now  Language barrier- need to find appropriate translator in AM Amanda [name]), big barrier to care  Best practice:  Diet: NPO Pain/Anxiety/Delirium protocol (if indicated): N/A VAP protocol (if indicated): N/A DVT prophylaxis: SCDs pending surgical eval GI prophylaxis: PPI Glucose control: insulin gtt Mobility: BR Code Status: full Family Communication: Amanda, [name] [phone]  Called and updated, he seemed to understand what was going on  Disposition: ICU pending imrpovement in respiratory status   Medical Decision Making    Diagnoses that are immediately life threatening include acute hypoxemic respiratory failure, severe lactic acidosis Critical test findings: lactate 8.8, CXR pulmonary edema Interventions today to address these diagnoses are BIPAP, close observation, antibiotics Likelihood of life-threatening deterioration without intervention is high.  Labs   CBC: Recent Labs  Lab 03/19/20 1724 03/19/20 1759  WBC  --  7.4  NEUTROABS  --  6.0  HGB 10.5*  9.6*  HCT 31.0* 29.5*  MCV  --  79.3*  PLT  --  371    Basic Metabolic Panel: Recent Labs  Lab 03/19/20 1724 03/19/20 1759 03/19/20 2046  NA 117* 116* 116*  K 4.1 4.2 4.1  CL  --   84* 82*  CO2  --  14* 13*  GLUCOSE  --  404* 400*  BUN  --  25* 27*  CREATININE  --  1.72* 1.66*  CALCIUM  --  9.2 9.3   GFR: Estimated Creatinine Clearance: 21 mL/min (A) (by C-G formula based on SCr of 1.66 mg/dL (H)). Recent Labs  Lab 03/19/20 1759 03/19/20 2046  WBC 7.4  --   LATICACIDVEN  --  8.8*    Liver Function Tests: Recent Labs  Lab 03/19/20 1759  AST 262*  ALT 247*  ALKPHOS 99  BILITOT 1.1  PROT 7.5  ALBUMIN 4.2   Recent Labs  Lab 03/19/20 2046  LIPASE 94*   Recent Labs  Lab 03/19/20 2046  AMMONIA 31    ABG    Component Value Date/Time   PHART 7.307 (L) 03/19/2020 1724   PCO2ART 34.4 03/19/2020 1724   PO2ART 40 (LL) 03/19/2020 1724   HCO3 17.2 (L) 03/19/2020 1724   TCO2 18 (L) 03/19/2020 1724   ACIDBASEDEF 8.0 (H) 03/19/2020 1724   O2SAT 70.0 03/19/2020 1724     Coagulation Profile: No results for input(s): INR, PROTIME in the last 168 hours.  Cardiac Enzymes: No results for input(s): CKTOTAL, CKMB, CKMBINDEX, TROPONINI in the last 168 hours.  HbA1C: Hgb A1c MFr Bld  Date/Time Value Ref Range Status  02/22/2020 03:10 PM 9.9 (H) 4.8 - 5.6 % Final    Comment:    (NOTE) Pre diabetes:          5.7%-6.4%  Diabetes:              >6.4%  Glycemic control for   <7.0% adults with diabetes   10/05/2011 06:50 AM 6.2 (H) <5.7 % Final    Comment:    (NOTE)                                                                       According to the ADA Clinical Practice Recommendations for 2011, when HbA1c is used as a screening test:  >=6.5%   Diagnostic of Diabetes Mellitus           (if abnormal result is confirmed) 5.7-6.4%   Increased risk of developing Diabetes Mellitus References:Diagnosis and Classification of Diabetes Mellitus,Diabetes IRCV,8938,10(FBPZW 1):S62-S69 and Standards of Medical Care in         Diabetes - 2011,Diabetes CHEN,2778,24 (Suppl 1):S11-S61.    CBG: Recent Labs  Lab 03/19/20 1654 03/19/20 2039 03/19/20 2228    GLUCAP 407* 388* 335*    Review of Systems:   Limited due to language barrier  Past Medical History  She,  has a past medical history of Abdominal pain, Chest pain, Diabetes mellitus, GERD (gastroesophageal reflux disease), Hyperlipidemia, and Hypertension.   Surgical History    Past Surgical History:  Procedure Laterality Date  . CARDIAC SURGERY    . 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 CORONARY ARTERY BYPASS GRAFT  10/05/2011   Procedure:  CORONARY ARTERY BYPASS GRAFTING (CABG);  Surgeon: Ivin Poot, MD;  Location: [facility];  Service: Open Heart Surgery;  Laterality: N/A;  TEE  . LEFT HEART CATHETERIZATION WITH CORONARY ANGIOGRAM N/A 10/04/2011   Procedure: LEFT HEART CATHETERIZATION WITH CORONARY ANGIOGRAM;  Surgeon: Sherren Mocha, MD;  Location: [facility] CATH LAB;  Service: Cardiovascular;  Laterality: N/A;     Social History   reports that she has never smoked. She has never used smokeless tobacco. She reports that she does not drink alcohol and does not use drugs.   Family History   Her Family history is unknown by patient.   Allergies No Known Allergies   Home Medications  Prior to Admission medications   Medication Sig Start Date End Date Taking? Authorizing Provider  antiseptic oral rinse (BIOTENE) LIQD 15 mLs by Mouth Rinse route as needed for dry mouth. 02/24/20   Regalado, Belkys A, MD  aspirin 81 MG tablet Take 81 mg by mouth daily.    [provider]  diclofenac Sodium (VOLTAREN) 1 % GEL Apply 1 application topically 4 (four) times daily. 02/15/20   [provider]  glipiZIDE (GLUCOTROL) 10 MG tablet Take 1 tablet by mouth daily. 03/05/18   [provider]  metoprolol tartrate (LOPRESSOR) 25 MG tablet Take 0.5 tablets (12.5 mg total) by mouth 2 (two) times daily. 02/24/20   Regalado, Belkys A, MD  pantoprazole (PROTONIX) 40 MG tablet Take 1 tablet (40 mg total) by mouth daily. 02/25/20   Regalado, Belkys A, MD  phenol (CHLORASEPTIC)  1.4 % LIQD Use as directed 1 spray in the mouth or throat as needed for throat irritation / pain. 02/24/20   Regalado, Belkys A, MD  pravastatin (PRAVACHOL) 80 MG tablet Take 80 mg by mouth daily.    [provider]  traMADol (ULTRAM) 50 MG tablet Take 1 tablet (50 mg total) by mouth every 6 (six) hours as needed for moderate pain. 02/24/20   Regalado, Cassie Freer, MD     Critical care time: 35 minutes

## 2020-03-19 NOTE — ED Notes (Signed)
Pulmonologist is at bedside.

## 2020-03-19 NOTE — ED Notes (Signed)
Date and time results received: 03/19/20 1900 (use smartphrase ".now" to insert current time)  Test: SODIUM Critical Value: 116  Name of Provider Notified: CACCAVALE, SOPHIA  Orders Received? Or Actions Taken?

## 2020-03-19 NOTE — ED Notes (Signed)
IV fluids stopped at this time due to patient having increased SOB. Dr. Hal Hope came to bedside.

## 2020-03-19 NOTE — ED Notes (Signed)
Received written order from Dr. Hal Hope to place pt on bipap.

## 2020-03-19 NOTE — ED Provider Notes (Addendum)
[facility] EMERGENCY DEPARTMENT Provider Note   CSN: [id number] Arrival date & time: 03/19/20  1634     History No chief complaint on file.   Amanda [name] is a 74 y.o. female presenting for shortness of breath.  Level V caveat due to language barrier and decreased verbal response.   Patient unable to provide history either due to language barrier, shortness of breath, or confusion.  History obtained from son via telephone.  He states she has been getting more short of breath recently.  Additionally, blood sugars have been elevated.  Today, patient was not responding appropriately at home, which is why EMS was called.  Son cannot specify when the symptoms began.  States patient has been having chest pain, unable to state when this started or what pattern.  Additional history obtained from chart review.  Patient was recently hospitalized for chest pain and hyponatremia (121 on arrival).  History of diabetes, hypertension, GERD, CAD  HPI     Past Medical History:  Diagnosis Date  . Abdominal pain   . Chest pain    Atypical  . Diabetes mellitus   . GERD (gastroesophageal reflux disease)   . Hyperlipidemia   . Hypertension     Patient Active Problem List   Diagnosis Date Noted  . DKA (diabetic ketoacidosis) ([place]) 03/19/2020  . Hyponatremia 02/22/2020  . 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 CAD (coronary artery disease) 10/04/2011  . Type 2 diabetes mellitus ([place]) 10/04/2011  . Chest pain 09/25/2011  . HELICOBACTER PYLORI INFECTION 01/25/2009  . DIABETIC FOOT ULCER 01/22/2009  . GERD 12/30/2008  . MURMUR 11/26/2008  . URINARY HESITANCY 11/26/2008  . HYPERCHOLESTEROLEMIA 09/14/2008  . Hypertension 09/14/2008  . CONSTIPATION 06/08/2008  . PRURITUS 05/21/2008  . DRY SKIN 05/21/2008  . 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 CARDIAC SURGERY    . CORONARY ARTERY BYPASS GRAFT  10/04/2011    . CORONARY ARTERY BYPASS GRAFT  10/05/2011   Procedure: CORONARY ARTERY BYPASS GRAFTING (CABG);  Surgeon: Ivin Poot, MD;  Location: [facility];  Service: Open Heart Surgery;  Laterality: N/A;  TEE  . LEFT HEART CATHETERIZATION WITH CORONARY ANGIOGRAM N/A 10/04/2011   Procedure: LEFT HEART CATHETERIZATION WITH CORONARY ANGIOGRAM;  Surgeon: Sherren Mocha, MD;  Location: [facility] CATH LAB;  Service: Cardiovascular;  Laterality: N/A;     OB History   No obstetric history on file.     Family History  Family history unknown: Yes    Social History   Tobacco Use  . Smoking status: Never Smoker  . Smokeless tobacco: Never Used  Vaping Use  . Vaping Use: Never used  Substance Use Topics  . Alcohol use: No  . Drug use: No    Home Medications Prior to Admission medications   Medication Sig Start Date End Date Taking? Authorizing Provider  antiseptic oral rinse (BIOTENE) LIQD 15 mLs by Mouth Rinse route as needed for dry mouth. 02/24/20   Regalado, Belkys A, MD  aspirin 81 MG tablet Take 81 mg by mouth daily.    [provider]  diclofenac Sodium (VOLTAREN) 1 % GEL Apply 1 application topically 4 (four) times daily. 02/15/20   [provider]  glipiZIDE (GLUCOTROL) 10 MG tablet Take 1 tablet by mouth daily. 03/05/18   [provider]  metoprolol tartrate (LOPRESSOR) 25 MG tablet Take 0.5 tablets (12.5 mg total) by mouth 2 (two) times daily. 02/24/20  Regalado, Belkys A, MD  pantoprazole (PROTONIX) 40 MG tablet Take 1 tablet (40 mg total) by mouth daily. 02/25/20   Regalado, Belkys A, MD  phenol (CHLORASEPTIC) 1.4 % LIQD Use as directed 1 spray in the mouth or throat as needed for throat irritation / pain. 02/24/20   Regalado, Belkys A, MD  pravastatin (PRAVACHOL) 80 MG tablet Take 80 mg by mouth daily.    [provider]  traMADol (ULTRAM) 50 MG tablet Take 1 tablet (50 mg total) by mouth every 6 (six) hours as needed for moderate pain. 02/24/20   Regalado,  Cassie Freer, MD    Allergies    Patient has no known allergies.  Review of Systems   Review of Systems  Respiratory: Positive for shortness of breath.   Cardiovascular: Positive for chest pain.  All other systems reviewed and are negative.   Physical Exam Updated Vital Signs BP 123/68   Pulse 82   Resp (!) 31   Ht 5' (1.524 m)   Wt 53.6 kg   SpO2 98%   BMI 23.08 kg/m   Physical Exam Vitals and nursing note reviewed.  Constitutional:      Appearance: She is well-developed.     Comments: Increased WOB  HENT:     Head: Normocephalic and atraumatic.  Eyes:     Extraocular Movements: Extraocular movements intact.     Conjunctiva/sclera: Conjunctivae normal.     Pupils: Pupils are equal, round, and reactive to light.  Cardiovascular:     Rate and Rhythm: Normal rate and regular rhythm.     Pulses: Normal pulses.  Pulmonary:     Effort: Accessory muscle usage present.     Comments: Tachypneic.  Increased work of breathing.  Patient appears to be self-PEEPing.  Clear lung sounds in all fields. Abdominal:     General: There is no distension.     Palpations: Abdomen is soft. There is no mass.     Tenderness: There is no abdominal tenderness. There is no guarding or rebound.  Musculoskeletal:        General: Normal range of motion.     Cervical back: Normal range of motion and neck supple.     Right lower leg: No edema.     Left lower leg: No edema.  Skin:    General: Skin is warm and dry.     Capillary Refill: Capillary refill takes less than 2 seconds.  Neurological:     Mental Status: She is oriented to person, place, and time.    ED Results / Procedures / Treatments   Labs (all labs ordered are listed, but only abnormal results are displayed) Labs Reviewed  CBC WITH DIFFERENTIAL/PLATELET - Abnormal; Notable for the following components:      Result Value   RBC 3.72 (*)    Hemoglobin 9.6 (*)    HCT 29.5 (*)    MCV 79.3 (*)    MCH 25.8 (*)    All other  components within normal limits  COMPREHENSIVE METABOLIC PANEL - Abnormal; Notable for the following components:   Sodium 116 (*)    Chloride 84 (*)    CO2 14 (*)    Glucose, Bld 404 (*)    BUN 25 (*)    Creatinine, Ser 1.72 (*)    AST 262 (*)    ALT 247 (*)    GFR, Estimated 31 (*)    Anion gap 18 (*)    All other components within normal limits  CBG  MONITORING, ED - Abnormal; Notable for the following components:   Glucose-Capillary 407 (*)    All other components within normal limits  I-STAT ARTERIAL BLOOD GAS, ED - Abnormal; Notable for the following components:   pH, Arterial 7.307 (*)    pO2, Arterial 40 (*)    Bicarbonate 17.2 (*)    TCO2 18 (*)    Acid-base deficit 8.0 (*)    Sodium 117 (*)    Calcium, Ion 1.14 (*)    HCT 31.0 (*)    Hemoglobin 10.5 (*)    All other components within normal limits  RESPIRATORY PANEL BY RT PCR (FLU A&B, COVID)  URINALYSIS, ROUTINE W REFLEX MICROSCOPIC  BASIC METABOLIC PANEL  BASIC METABOLIC PANEL  BASIC METABOLIC PANEL  BASIC METABOLIC PANEL  BETA-HYDROXYBUTYRIC ACID  BETA-HYDROXYBUTYRIC ACID  LIPASE, BLOOD  TSH  AMMONIA  VITAMIN B12  FOLATE  IRON AND TIBC  FERRITIN  RETICULOCYTES  CBG MONITORING, ED  I-STAT VENOUS BLOOD GAS, ED  TROPONIN I (HIGH SENSITIVITY)  TROPONIN I (HIGH SENSITIVITY)    EKG EKG Interpretation  Date/Time:  Friday March 19 2020 16:48:05 EDT Ventricular Rate:  81 PR Interval:    QRS Duration: 85 QT Interval:  352 QTC Calculation: 412 R Axis:   71 Text Interpretation: Sinus rhythm Probable left atrial enlargement Low voltage, extremity leads Repol abnrm suggests ischemia, anterolateral ST elevation, consider inferior injury when compared to prior,  more wandering baseline. No STEMI Confirmed by Antony Blackbird [phone]) on 03/19/2020 4:52:57 PM   Radiology CT Head Wo Contrast  Result Date: 03/19/2020 CLINICAL DATA:  Mental status change, unknown cause. Altered mental status. Shortness of breath  and chest pain. EXAM: CT HEAD WITHOUT CONTRAST TECHNIQUE: Contiguous axial images were obtained from the base of the skull through the vertex without intravenous contrast. COMPARISON:  No pertinent prior exams are available for comparison. FINDINGS: Brain: Mild generalized cerebral atrophy. Moderate ill-defined hypoattenuation within the cerebral white matter is nonspecific, but compatible chronic small vessel ischemic disease. There is no acute intracranial hemorrhage. No demarcated cortical infarct. No extra-axial fluid collection. No evidence of intracranial mass. No midline shift. Vascular: No hyperdense vessel.  Atherosclerotic calcifications. Skull: No acute calvarial fracture or aggressive osseous lesion. Sinuses/Orbits: Visualized orbits show no acute finding. Trace ethmoid sinus mucosal thickening. No significant mastoid effusion. IMPRESSION: No evidence of acute intracranial abnormality. Mild cerebral atrophy with moderate chronic small vessel ischemic disease. Electronically Signed   By: Kellie Simmering DO   On: 03/19/2020 19:04   DG Chest Portable 1 View  Result Date: 03/19/2020 CLINICAL DATA:  Shortness of breath EXAM: PORTABLE CHEST 1 VIEW COMPARISON:  02/22/2020 FINDINGS: Cardiac shadow is enlarged but stable. Postsurgical changes are again seen. Aortic calcifications are noted. The lungs are well aerated bilaterally. Patchy right basilar infiltrate is seen. Some mild interstitial changes are noted which may represent mild edema. No bony abnormality is seen. IMPRESSION: Changes of mild edema and new right basilar infiltrate. Electronically Signed   By: Inez Catalina M.D.   On: 03/19/2020 17:09    Procedures .Critical Care Performed by: Franchot Heidelberg, PA-C Authorized by: Franchot Heidelberg, PA-C   Critical care provider statement:    Critical care time (minutes):  50   Critical care time was exclusive of:  Separately billable procedures and treating other patients and teaching time    Critical care was necessary to treat or prevent imminent or life-threatening deterioration of the following conditions:  CNS failure or compromise and endocrine crisis  Critical care was time spent personally by me on the following activities:  Blood draw for specimens, development of treatment plan with patient or surrogate, evaluation of patient's response to treatment, examination of patient, obtaining history from patient or surrogate, ordering and performing treatments and interventions, ordering and review of laboratory studies, ordering and review of radiographic studies, pulse oximetry, re-evaluation of patient's condition and review of old charts   I assumed direction of critical care for this patient from another provider in my specialty: no   Comments:     Pt in DKA requiring gtt and admission   (including critical care time)  Medications Ordered in ED Medications  lactated ringers bolus 1,072 mL (has no administration in time range)  insulin regular, human (MYXREDLIN) 100 units/ 100 mL infusion (has no administration in time range)  lactated ringers infusion (has no administration in time range)  dextrose 5 % in lactated ringers infusion (has no administration in time range)  dextrose 50 % solution 0-50 mL (has no administration in time range)  potassium chloride 10 mEq in 100 mL IVPB (has no administration in time range)  cefTRIAXone (ROCEPHIN) 1 g in sodium chloride 0.9 % 100 mL IVPB (has no administration in time range)  azithromycin (ZITHROMAX) 500 mg in sodium chloride 0.9 % 250 mL IVPB (has no administration in time range)    ED Course  I have reviewed the triage vital signs and the nursing notes.  Pertinent labs & imaging results that were available during my care of the patient were reviewed by me and considered in my medical decision making (see chart for details).    MDM Rules/Calculators/A&P                          Patient presented for evaluation of altered  mental status and abnormal breathing.  History limited due to language barrier and confusion of patient.  On exam, she is tachypneic with increased work of breathing. In the setting of newly elevated blood sugars, consider DKA.  Also consider COPD/CHF.  Patient recently admitted with hyponatremia, consider this as cause.  She will need labs, head CT, chest x-ray.  Chest x-ray viewed interpreted by me, consistent with right lower lobe infiltrate.  Will start antibiotics labs interpreted by me, concerning for hyponatremia, even when corrected for pseudohyponatremia.  Additionally, patient with elevated blood sugar and low bicarb of 14.  She also is a gap.  Concerning for DKA.  Will start insulin drip.  Creatinine elevated from a few weeks ago, however not doubled.  ABG was actually VBG, patient is mildly acidotic at 7.307.  LFTs elevated, unknown cause at this time.  Will obtain right upper quadrant ultrasound and lipase for further evaluation.  CT head negative for acute findings.  Discussed findings with patient son, and he discussed with patient via telephone.  Patient will need to be admitted.  Discussed with Dr. Hal Hope from triad hospitalist service, patient to be admitted.   10:49 PM Pt with worsening respiratory status, on BiPaP. Lactic 8.8. US shows acute cholecystitis. Consulted gen surgery and discussed with admitting team.   Gen surgery recommends CT with contrast. Dr. Hal Hope discussing with PCCM.   Critical care to admit.   Final Clinical Impression(s) / ED Diagnoses Final diagnoses:  Diabetic ketoacidosis without coma associated with type 2 diabetes mellitus ([place])  Hyponatremia  Elevated LFTs  Community acquired pneumonia of right lower lobe of lung    Rx / DC  Orders ED Discharge Orders    None       Franchot Heidelberg, PA-C 03/19/20 2010    [address], PA-C 03/19/20 2303    Tegeler, Gwenyth Allegra, MD 03/22/20 [phone]

## 2020-03-20 ENCOUNTER — Inpatient Hospital Stay (HOSPITAL_COMMUNITY): Payer: Medicare Other

## 2020-03-20 DIAGNOSIS — I428 Other cardiomyopathies: Secondary | ICD-10-CM

## 2020-03-20 DIAGNOSIS — E1110 Type 2 diabetes mellitus with ketoacidosis without coma: Secondary | ICD-10-CM | POA: Diagnosis not present

## 2020-03-20 DIAGNOSIS — R0602 Shortness of breath: Secondary | ICD-10-CM

## 2020-03-20 DIAGNOSIS — R4182 Altered mental status, unspecified: Secondary | ICD-10-CM | POA: Diagnosis not present

## 2020-03-20 DIAGNOSIS — J9601 Acute respiratory failure with hypoxia: Secondary | ICD-10-CM | POA: Diagnosis not present

## 2020-03-20 LAB — BASIC METABOLIC PANEL
Anion gap: 16 — ABNORMAL HIGH (ref 5–15)
Anion gap: 16 — ABNORMAL HIGH (ref 5–15)
Anion gap: 17 — ABNORMAL HIGH (ref 5–15)
BUN: 27 mg/dL — ABNORMAL HIGH (ref 8–23)
BUN: 25 mg/dL — ABNORMAL HIGH (ref 8–23)
BUN: 25 mg/dL — ABNORMAL HIGH (ref 8–23)
CO2: 16 mmol/L — ABNORMAL LOW (ref 22–32)
CO2: 19 mmol/L — ABNORMAL LOW (ref 22–32)
CO2: 21 mmol/L — ABNORMAL LOW (ref 22–32)
Calcium: 8.4 mg/dL — ABNORMAL LOW (ref 8.9–10.3)
Calcium: 9 mg/dL (ref 8.9–10.3)
Calcium: 9.8 mg/dL (ref 8.9–10.3)
Chloride: 85 mmol/L — ABNORMAL LOW (ref 98–111)
Chloride: 85 mmol/L — ABNORMAL LOW (ref 98–111)
Chloride: 88 mmol/L — ABNORMAL LOW (ref 98–111)
Creatinine, Ser: 1.6 mg/dL — ABNORMAL HIGH (ref 0.44–1.00)
Creatinine, Ser: 1.64 mg/dL — ABNORMAL HIGH (ref 0.44–1.00)
Creatinine, Ser: 1.63 mg/dL — ABNORMAL HIGH (ref 0.44–1.00)
GFR, Estimated: 33 mL/min — ABNORMAL LOW (ref >60)
GFR, Estimated: 32 mL/min — ABNORMAL LOW (ref >60)
GFR, Estimated: 33 mL/min — ABNORMAL LOW (ref >60)
Glucose, Bld: 379 mg/dL — ABNORMAL HIGH (ref 70–99)
Glucose, Bld: 318 mg/dL — ABNORMAL HIGH (ref 70–99)
Glucose, Bld: 163 mg/dL — ABNORMAL HIGH (ref 70–99)
Potassium: 4.5 mmol/L (ref 3.5–5.1)
Potassium: 4.5 mmol/L (ref 3.5–5.1)
Potassium: 3.2 mmol/L — ABNORMAL LOW (ref 3.5–5.1)
Sodium: 117 mmol/L — CL (ref 135–145)
Sodium: 120 mmol/L — ABNORMAL LOW (ref 135–145)
Sodium: 126 mmol/L — ABNORMAL LOW (ref 135–145)

## 2020-03-20 LAB — GLUCOSE, CAPILLARY
Glucose-Capillary: 393 mg/dL — ABNORMAL HIGH (ref 70–99)
Glucose-Capillary: 388 mg/dL — ABNORMAL HIGH (ref 70–99)
Glucose-Capillary: 324 mg/dL — ABNORMAL HIGH (ref 70–99)
Glucose-Capillary: 241 mg/dL — ABNORMAL HIGH (ref 70–99)
Glucose-Capillary: 116 mg/dL — ABNORMAL HIGH (ref 70–99)
Glucose-Capillary: 214 mg/dL — ABNORMAL HIGH (ref 70–99)
Glucose-Capillary: 205 mg/dL — ABNORMAL HIGH (ref 70–99)
Glucose-Capillary: 191 mg/dL — ABNORMAL HIGH (ref 70–99)
Glucose-Capillary: 163 mg/dL — ABNORMAL HIGH (ref 70–99)
Glucose-Capillary: 149 mg/dL — ABNORMAL HIGH (ref 70–99)
Glucose-Capillary: 133 mg/dL — ABNORMAL HIGH (ref 70–99)
Glucose-Capillary: 123 mg/dL — ABNORMAL HIGH (ref 70–99)

## 2020-03-20 LAB — HEPATIC FUNCTION PANEL
ALT: 151 U/L — ABNORMAL HIGH (ref 0–44)
AST: 117 U/L — ABNORMAL HIGH (ref 15–41)
Albumin: 4.9 g/dL (ref 3.5–5.0)
Alkaline Phosphatase: 75 U/L (ref 38–126)
Bilirubin, Direct: 0.3 mg/dL — ABNORMAL HIGH (ref 0.0–0.2)
Indirect Bilirubin: 1 mg/dL — ABNORMAL HIGH (ref 0.3–0.9)
Total Bilirubin: 1.3 mg/dL — ABNORMAL HIGH (ref 0.3–1.2)
Total Protein: 7.8 g/dL (ref 6.5–8.1)

## 2020-03-20 LAB — ECHOCARDIOGRAM LIMITED
Area-P 1/2: 6.17 cm2
Calc EF: 69.9 %
Height: 60 in
S' Lateral: 3 cm
Single Plane A2C EF: 71.8 %
Single Plane A4C EF: 65.9 %
Weight: 1890.66 oz

## 2020-03-20 LAB — URINALYSIS, ROUTINE W REFLEX MICROSCOPIC
Bacteria, UA: NONE SEEN
Bilirubin Urine: NEGATIVE
Glucose, UA: >=500 mg/dL — AB
Hgb urine dipstick: NEGATIVE
Ketones, ur: NEGATIVE mg/dL
Leukocytes,Ua: NEGATIVE
Nitrite: NEGATIVE
Protein, ur: 30 mg/dL — AB
Specific Gravity, Urine: 1.016 (ref 1.005–1.030)
pH: 5 (ref 5.0–8.0)

## 2020-03-20 LAB — CBC
HCT: 23.7 % — ABNORMAL LOW (ref 36.0–46.0)
Hemoglobin: 8 g/dL — ABNORMAL LOW (ref 12.0–15.0)
MCH: 25.6 pg — ABNORMAL LOW (ref 26.0–34.0)
MCHC: 33.8 g/dL (ref 30.0–36.0)
MCV: 75.7 fL — ABNORMAL LOW (ref 80.0–100.0)
Platelets: 157 10*3/uL (ref 150–400)
RBC: 3.13 MIL/uL — ABNORMAL LOW (ref 3.87–5.11)
RDW: 12.4 % (ref 11.5–15.5)
WBC: 6 10*3/uL (ref 4.0–10.5)
nRBC: 0 % (ref 0.0–0.2)

## 2020-03-20 LAB — BRAIN NATRIURETIC PEPTIDE: B Natriuretic Peptide: 1085.8 pg/mL — ABNORMAL HIGH (ref 0.0–100.0)

## 2020-03-20 LAB — LACTIC ACID, PLASMA
Lactic Acid, Venous: 6.7 mmol/L (ref 0.5–1.9)
Lactic Acid, Venous: 4.2 mmol/L (ref 0.5–1.9)
Lactic Acid, Venous: 2.5 mmol/L (ref 0.5–1.9)

## 2020-03-20 LAB — PROCALCITONIN: Procalcitonin: 0.18 ng/mL

## 2020-03-20 LAB — PHOSPHORUS: Phosphorus: 3.7 mg/dL (ref 2.5–4.6)

## 2020-03-20 LAB — MAGNESIUM: Magnesium: 1.7 mg/dL (ref 1.7–2.4)

## 2020-03-20 MED ORDER — CHLORHEXIDINE GLUCONATE CLOTH 2 % EX PADS
6.0000 | MEDICATED_PAD | Freq: Every day | CUTANEOUS | Status: DC
  Administered 2020-03-20 – 2020-03-26 (×8): 6 via TOPICAL

## 2020-03-20 MED ORDER — INSULIN DETEMIR 100 UNIT/ML ~~LOC~~ SOLN
10.0000 [IU] | Freq: Two times a day (BID) | SUBCUTANEOUS | Status: DC
  Administered 2020-03-20 (×2): 10 [IU] via SUBCUTANEOUS
  Filled 2020-03-20 (×4): qty 0.1

## 2020-03-20 MED ORDER — ORAL CARE MOUTH RINSE
15.0000 mL | Freq: Two times a day (BID) | OROMUCOSAL | Status: DC
  Administered 2020-03-20 – 2020-03-23 (×5): 15 mL via OROMUCOSAL

## 2020-03-20 MED ORDER — IPRATROPIUM-ALBUTEROL 0.5-2.5 (3) MG/3ML IN SOLN
3.0000 mL | Freq: Four times a day (QID) | RESPIRATORY_TRACT | Status: DC
  Administered 2020-03-20 – 2020-03-22 (×9): 3 mL via RESPIRATORY_TRACT
  Filled 2020-03-20 (×9): qty 3

## 2020-03-20 MED ORDER — IOHEXOL 300 MG/ML  SOLN
100.0000 mL | Freq: Once | INTRAMUSCULAR | Status: AC | PRN
  Administered 2020-03-20: 100 mL via INTRAVENOUS

## 2020-03-20 MED ORDER — POTASSIUM CHLORIDE 20 MEQ PO PACK
40.0000 meq | PACK | Freq: Once | ORAL | Status: AC
  Administered 2020-03-20: 40 meq via ORAL
  Filled 2020-03-20: qty 2

## 2020-03-20 MED ORDER — PERFLUTREN LIPID MICROSPHERE
1.0000 mL | INTRAVENOUS | Status: AC | PRN
  Administered 2020-03-20: 2 mL via INTRAVENOUS
  Filled 2020-03-20: qty 10

## 2020-03-20 MED ORDER — ALBUTEROL SULFATE (2.5 MG/3ML) 0.083% IN NEBU: 2.5000 mg | INHALATION_SOLUTION | RESPIRATORY_TRACT | Status: DC | PRN

## 2020-03-20 MED ORDER — ENOXAPARIN SODIUM 30 MG/0.3ML ~~LOC~~ SOLN
30.0000 mg | SUBCUTANEOUS | Status: DC
  Administered 2020-03-20 – 2020-03-26 (×7): 30 mg via SUBCUTANEOUS
  Filled 2020-03-20 (×7): qty 0.3

## 2020-03-20 MED ORDER — FUROSEMIDE 10 MG/ML IJ SOLN
40.0000 mg | Freq: Once | INTRAMUSCULAR | Status: AC
  Administered 2020-03-20: 40 mg via INTRAVENOUS
  Filled 2020-03-20: qty 4

## 2020-03-20 MED ORDER — CHLORHEXIDINE GLUCONATE 0.12 % MT SOLN
15.0000 mL | Freq: Two times a day (BID) | OROMUCOSAL | Status: DC
  Administered 2020-03-22 – 2020-03-23 (×3): 15 mL via OROMUCOSAL
  Filled 2020-03-20 (×2): qty 15

## 2020-03-20 MED ORDER — ACETAMINOPHEN 325 MG PO TABS
650.0000 mg | ORAL_TABLET | Freq: Four times a day (QID) | ORAL | Status: DC | PRN
  Administered 2020-03-22: 650 mg via ORAL
  Filled 2020-03-20: qty 2

## 2020-03-20 MED ORDER — INSULIN ASPART 100 UNIT/ML ~~LOC~~ SOLN
2.0000 [IU] | SUBCUTANEOUS | Status: DC
  Administered 2020-03-20: 6 [IU] via SUBCUTANEOUS
  Administered 2020-03-20 – 2020-03-21 (×5): 2 [IU] via SUBCUTANEOUS

## 2020-03-20 MED ORDER — MAGNESIUM SULFATE 2 GM/50ML IV SOLN
2.0000 g | Freq: Once | INTRAVENOUS | Status: AC
  Administered 2020-03-20: 2 g via INTRAVENOUS
  Filled 2020-03-20: qty 50

## 2020-03-20 NOTE — ED Notes (Signed)
Report given to floor. Beckie Busing, RN

## 2020-03-20 NOTE — Progress Notes (Signed)
Notified nuclear medicine technologist Amanda [name] of stat NM hepatobiliary liver function to be performed. Ms. Amanda [name] states that due to the national shortage of medication, unable to complete until Monday.

## 2020-03-20 NOTE — Progress Notes (Signed)
Stopped by this morning to check on patient.  Interpreter available over the phone with nurses.  Complaining of pain all over the body - worst in her feet.  No specific complaints of right upper quadrant pain.  No right upper quadrant tenderness on exam.  Multiple metabolic abnormalities, being treated for CHF.  Recommend HIDA scan on Monday if concern continues for cholecystitis but imaging findings seem secondary to heart failure at this time.  Louanna Raw, MD [facility]

## 2020-03-20 NOTE — Progress Notes (Signed)
  Echocardiogram 2D Echocardiogram has been performed.  Amanda [name] 03/20/2020, 9:28 AM

## 2020-03-20 NOTE — Progress Notes (Signed)
NAME:  Amanda [name], MRN:  [id number], DOB:  08-27-1945, LOS: 1 ADMISSION DATE:  03/19/2020, CONSULTATION DATE:  03/19/20 REFERRING MD:  Tegeler, CHIEF COMPLAINT:  SOB   Brief History   74 year old woman presenting with acute hypoxemic respiratory failure and abdominal pain found to have signs of both fluid overload and acute cholecystitis.  History of present illness   74 year old non english speaking woman [place], unable to find interpreter for this) presenting with belly pain and SOB.  Workup in ER reveals acute on chronic hyponatremia, acute liver injury, acute kidney injury, lactic acidosis, and significant hyperglycemia.  [place] RUQ c/w cholecystitis.  Gen Surg called and recommends CT A/P for now, this is pending.  Patient had progressive dyspnea with fluid resuscitation so placed and BIPAP and PCCM consulted.  Past Medical History  HTN DM GERD  Significant Hospital Events   03/19/20 admitted  Consults:  General Surgery  Procedures:  N/A  Significant Diagnostic Tests:  CT A/P >> CXR pulmonary edema RUQ [place] cholecystitis changes  Limited ECHO 11/6: EF 60-65%, grade II diastolic dysfunction. RV systolic function normal. Elevated RVSP 5mmHg. Left atria mildly dilated.  Micro Data:  COVID>>negative Bld Cx>> MRSA negative  Antimicrobials:  Zosyn>>   Interim history/subjective:  Patient admitted to the ICU. Spoke with her via translater. She is complaining of some shortness of breath and pain on the bottom of her feet. She also complains of being thirst. She is requiring intermittent bipap.  Objective   Blood pressure 119/67, pulse 89, temperature 98.1 F (36.7 C), temperature source Axillary, resp. rate 19, height 5' (1.524 m), weight 53.6 kg, SpO2 100 %.        Intake/Output Summary (Last 24 hours) at 03/20/2020 0805 Last data filed at 03/20/2020 0700 Gross per 24 hour  Intake 229.88 ml  Output 850 ml  Net -620.12 ml   Filed Weights   03/19/20 1900  Weight:  53.6 kg    Examination: Constitutional: elderly woman in no acute distress Eyes: eyes are anicteric, reactive to light Ears, nose, mouth, and throat: BIPAP in place with good seal Cardiovascular: heart sounds are regular, ext are warm to touch. trace edema Respiratory: Diminished at bases with crackles, no accessory muscle use, diffuse wheezing Gastrointestinal: abdomen is soft, diffuse TTP Skin: No rashes, normal turgor Neurologic: moves all 4 ext   Resolved Hospital Problem list     Assessment & Plan:  Acute hypoxemic respiratory failure due to pulmonary edema- trop neg, echo last month benign - spot lasix - Strict I/O - BIPAP PRN - Repeat limited echo, check BNP - scheduled duonebs for wheezing. Unsure if underlying lung disease.  Radiographic findings of Acute cholecystitis - No acute complaints of abdominal pain or fevers - f/u CT A/P, gen surgery recs - zosyn for now, low threshold to discontinue  Acute on chronic kidney injury, mild In setting of respiratory failure and acute heart failure - monitor renal function - spot diuresis  Lactic acidosis  In setting of heart failure and respiratory failure  - trending down with diuresis and supplemental oxygen  Acute on chronic hyponatremia likely acute phase reactant plus pseudohyponatremia related to hyperglycemia (corrected 123) - Goal corrected 120-130 for acute illness  DM with hyperglycemia, beta hydroxybutyrate neg - transitioned off insulin drip to subq  Language barrier- need to find appropriate translator in AM [name]), big barrier to care  Best practice:  Diet: NPO Pain/Anxiety/Delirium protocol (if indicated): N/A VAP protocol (if indicated): N/A DVT  prophylaxis: SCDs pending surgical eval GI prophylaxis: PPI Glucose control: insulin gtt Mobility: BR Code Status: full Family Communication: Delaina, [place] [phone]  Called and updated, he seemed to understand what was going on  Disposition:  ICU pending imrpovement in respiratory status   Medical Decision Making    Diagnoses that are immediately life threatening include acute hypoxemic respiratory failure, severe lactic acidosis Critical test findings: lactate 8.8, CXR pulmonary edema Interventions today to address these diagnoses are BIPAP, close observation, antibiotics Likelihood of life-threatening deterioration without intervention is high.  Labs   CBC: Recent Labs  Lab 03/19/20 1724 03/19/20 1759 03/20/20 0340  WBC  --  7.4 6.0  NEUTROABS  --  6.0  --   HGB 10.5* 9.6* 8.0*  HCT 31.0* 29.5* 23.7*  MCV  --  79.3* 75.7*  PLT  --  199 518    Basic Metabolic Panel: Recent Labs  Lab 03/19/20 1724 03/19/20 1759 03/19/20 2046 03/20/20 0007 03/20/20 0340  NA 117* 116* 116* 117* 120*  K 4.1 4.2 4.1 4.5 4.5  CL  --  84* 82* 85* 85*  CO2  --  14* 13* 16* 19*  GLUCOSE  --  404* 400* 379* 318*  BUN  --  25* 27* 27* 25*  CREATININE  --  1.72* 1.66* 1.60* 1.64*  CALCIUM  --  9.2 9.3 8.4* 9.0  MG  --   --   --   --  1.7  PHOS  --   --   --   --  3.7   GFR: Estimated Creatinine Clearance: 21.3 mL/min (A) (by C-G formula based on SCr of 1.64 mg/dL (H)). Recent Labs  Lab 03/19/20 1759 03/19/20 2046 03/20/20 0007 03/20/20 0340  PROCALCITON  --   --  0.18  --   WBC 7.4  --   --  6.0  LATICACIDVEN  --  8.8* 6.7* 4.2*    Liver Function Tests: Recent Labs  Lab 03/19/20 1759  AST 262*  ALT 247*  ALKPHOS 99  BILITOT 1.1  PROT 7.5  ALBUMIN 4.2   Recent Labs  Lab 03/19/20 2046  LIPASE 94*   Recent Labs  Lab 03/19/20 2046  AMMONIA 31    ABG    Component Value Date/Time   PHART 7.307 (L) 03/19/2020 1724   PCO2ART 34.4 03/19/2020 1724   PO2ART 40 (LL) 03/19/2020 1724   HCO3 17.2 (L) 03/19/2020 1724   TCO2 18 (L) 03/19/2020 1724   ACIDBASEDEF 8.0 (H) 03/19/2020 1724   O2SAT 70.0 03/19/2020 1724     Coagulation Profile: No results for input(s): INR, PROTIME in the last 168 hours.  Cardiac  Enzymes: No results for input(s): CKTOTAL, CKMB, CKMBINDEX, TROPONINI in the last 168 hours.   Critical care time: 35 minutes     Freda Jackson, MD [facility] Office: [phone]   See Amion for Pager Details

## 2020-03-20 NOTE — Progress Notes (Signed)
Writer notified E-link regarding insullin drip and D5 order clarification, no new orders.

## 2020-03-20 NOTE — Progress Notes (Signed)
eLink Physician-Brief Progress Note Patient Name: Amanda [name] DOB: 08-24-1945 MRN: [id number]   Date of Service  03/20/2020  HPI/Events of Note  Patient admitted with altered mental status, hyponatremia, uncontrolled diabetes mellitus, lactic acidosis, r/o septic shock, acute hypoxemic respiratory failure and r/o cholecystitis.  eICU Interventions  New Patient Evaluation completed.        Jerime Arif U Eryx Zane 03/20/2020, 2:00 AM

## 2020-03-20 NOTE — Consult Note (Signed)
CC: Abdominal discomfort; possible cholecystitis  Requesting provider: Dr. Sherry Ruffing  HPI: Amanda [name] is an 74 y.o. female - with hx of HTN, HLD, GERD, whom we are asked to see for possible acute cholecystitis. Our history is quite limited 2/2 language barrier and no family reachable. By review, she speaks [place] and there is not a local interpreter for this. As per chart review, she presented to ED with abd pain, chronic hyponatremia, acute liver injury, acute kidney injury, lactic acidosis, and significant hyperglycemia  WBC 7 Hgb 9.6 Plt 199  AST/ALT 262/247 Alk phos 99 TBili 1.1  BNP 1086  Lactate 8.8  [place] RUQ shows gallstones and sludge. GBW 1.6 cm with small pericholecystic fluid  CT AP with contrast shows small gallstone near neck, +PCCF  She experienced physiologic decompensation in the ED from respiratory standpoint - complaining of significant dyspnea and inability to breathe. She was placed on bipap. Additionally found to be in possible DKA.  Past Medical History:  Diagnosis Date  . Abdominal pain   . Chest pain    Atypical  . Diabetes mellitus   . GERD (gastroesophageal reflux disease)   . Hyperlipidemia   . Hypertension     Past Surgical History:  Procedure Laterality Date  . CARDIAC SURGERY    . 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 CORONARY ARTERY BYPASS GRAFT  10/05/2011   Procedure: CORONARY ARTERY BYPASS GRAFTING (CABG);  Surgeon: Ivin Poot, MD;  Location: [facility];  Service: Open Heart Surgery;  Laterality: N/A;  TEE  . LEFT HEART CATHETERIZATION WITH CORONARY ANGIOGRAM N/A 10/04/2011   Procedure: LEFT HEART CATHETERIZATION WITH CORONARY ANGIOGRAM;  Surgeon: Sherren Mocha, MD;  Location: [facility] CATH LAB;  Service: Cardiovascular;  Laterality: N/A;    Family History  Family history unknown: Yes    Social:  reports that she has never smoked. She has never used smokeless tobacco. She reports that she does not drink alcohol and does not use  drugs.  Allergies: No Known Allergies  Medications: I have reviewed the patient's current medications.  Results for orders placed or performed during the hospital encounter of 03/19/20 (from the past 48 hour(s))  POC CBG, ED     Status: Abnormal   Collection Time: 03/19/20  4:54 PM  Result Value Ref Range   Glucose-Capillary 407 (H) 70 - 99 mg/dL    Comment: Glucose reference range applies only to samples taken after fasting for at least 8 hours.   Comment 1 Notify RN    Comment 2 Document in Chart   I-Stat arterial blood gas, [facility])     Status: Abnormal   Collection Time: 03/19/20  5:24 PM  Result Value Ref Range   pH, Arterial 7.307 (L) 7.35 - 7.45   pCO2 arterial 34.4 32 - 48 mmHg   pO2, Arterial 40 (LL) 83 - 108 mmHg   Bicarbonate 17.2 (L) 20.0 - 28.0 mmol/L   TCO2 18 (L) 22 - 32 mmol/L   O2 Saturation 70.0 %   Acid-base deficit 8.0 (H) 0.0 - 2.0 mmol/L   Sodium 117 (LL) 135 - 145 mmol/L   Potassium 4.1 3.5 - 5.1 mmol/L   Calcium, Ion 1.14 (L) 1.15 - 1.40 mmol/L   HCT 31.0 (L) 36 - 46 %   Hemoglobin 10.5 (L) 12.0 - 15.0 g/dL   Collection site Radial    Drawn by RT    Sample type ARTERIAL    Comment NOTIFIED PHYSICIAN   CBC with Differential  Status: Abnormal   Collection Time: 03/19/20  5:59 PM  Result Value Ref Range   WBC 7.4 4.0 - 10.5 K/uL   RBC 3.72 (L) 3.87 - 5.11 MIL/uL   Hemoglobin 9.6 (L) 12.0 - 15.0 g/dL   HCT 29.5 (L) 36 - 46 %   MCV 79.3 (L) 80.0 - 100.0 fL   MCH 25.8 (L) 26.0 - 34.0 pg   MCHC 32.5 30.0 - 36.0 g/dL   RDW 12.3 11.5 - 15.5 %   Platelets 199 150 - 400 K/uL   nRBC 0.0 0.0 - 0.2 %   Neutrophils Relative % 81 %   Neutro Abs 6.0 1.7 - 7.7 K/uL   Lymphocytes Relative 13 %   Lymphs Abs 1.0 0.7 - 4.0 K/uL   Monocytes Relative 6 %   Monocytes Absolute 0.4 0.1 - 1.0 K/uL   Eosinophils Relative 0 %   Eosinophils Absolute 0.0 0.0 - 0.5 K/uL   Basophils Relative 0 %   Basophils Absolute 0.0 0.0 - 0.1 K/uL   Immature Granulocytes 0 %   Abs  Immature Granulocytes 0.03 0.00 - 0.07 K/uL    Comment: Performed at [facility], [address] [place], [place] [address]  Comprehensive metabolic panel     Status: Abnormal   Collection Time: 03/19/20  5:59 PM  Result Value Ref Range   Sodium 116 (LL) 135 - 145 mmol/L    Comment: CRITICAL RESULT CALLED TO, READ BACK BY AND VERIFIED WITH: RN Resa Miner AT 1900 03/19/20 BY L BENFIELD    Potassium 4.2 3.5 - 5.1 mmol/L   Chloride 84 (L) 98 - 111 mmol/L   CO2 14 (L) 22 - 32 mmol/L   Glucose, Bld 404 (H) 70 - 99 mg/dL    Comment: Glucose reference range applies only to samples taken after fasting for at least 8 hours.   BUN 25 (H) 8 - 23 mg/dL   Creatinine, Ser 1.72 (H) 0.44 - 1.00 mg/dL   Calcium 9.2 8.9 - 10.3 mg/dL   Total Protein 7.5 6.5 - 8.1 g/dL   Albumin 4.2 3.5 - 5.0 g/dL   AST 262 (H) 15 - 41 U/L   ALT 247 (H) 0 - 44 U/L   Alkaline Phosphatase 99 38 - 126 U/L   Total Bilirubin 1.1 0.3 - 1.2 mg/dL   GFR, Estimated 31 (L) >60 mL/min    Comment: (NOTE) Calculated using the CKD-EPI Creatinine Equation (2021)    Anion gap 18 (H) 5 - 15    Comment: Performed at [facility], [place] [address] [place], [place] [address]  Troponin I (High Sensitivity)     Status: None   Collection Time: 03/19/20  5:59 PM  Result Value Ref Range   Troponin I (High Sensitivity) 9 <18 ng/L    Comment: Performed at [facility] [address] [place], [place] [address]  CBG monitoring, ED     Status: Abnormal   Collection Time: 03/19/20  8:39 PM  Result Value Ref Range   Glucose-Capillary 388 (H) 70 - 99 mg/dL    Comment: Glucose reference range applies only to samples taken after fasting for at least 8 hours.  Troponin I (High Sensitivity)     Status: None   Collection Time: 03/19/20  8:46 PM  Result Value Ref Range   Troponin I (High Sensitivity) 11 <18 ng/L    Comment: (NOTE) Elevated high sensitivity troponin I (hsTnI) values and significant  changes across serial  measurements may suggest ACS but many other  chronic and acute conditions are known to elevate hsTnI results.  Refer to the Links section for chest pain algorithms and additional  guidance. Performed at [facility], [place] [address] [place], [place] [address]   Basic metabolic panel     Status: Abnormal   Collection Time: 03/19/20  8:46 PM  Result Value Ref Range   Sodium 116 (LL) 135 - 145 mmol/L    Comment: CRITICAL RESULT CALLED TO, READ BACK BY AND VERIFIED WITH: ISLEY K,RN 03/19/20 2152 WAYK    Potassium 4.1 3.5 - 5.1 mmol/L   Chloride 82 (L) 98 - 111 mmol/L   CO2 13 (L) 22 - 32 mmol/L   Glucose, Bld 400 (H) 70 - 99 mg/dL    Comment: Glucose reference range applies only to samples taken after fasting for at least 8 hours.   BUN 27 (H) 8 - 23 mg/dL   Creatinine, Ser 1.66 (H) 0.44 - 1.00 mg/dL   Calcium 9.3 8.9 - 10.3 mg/dL   GFR, Estimated 32 (L) >60 mL/min    Comment: (NOTE) Calculated using the CKD-EPI Creatinine Equation (2021)    Anion gap 21 (H) 5 - 15    Comment: Performed at [facility] [address] [place], [place] [address]  Beta-hydroxybutyric acid     Status: None   Collection Time: 03/19/20  8:46 PM  Result Value Ref Range   Beta-Hydroxybutyric Acid 0.26 0.05 - 0.27 mmol/L    Comment: Performed at [facility] [address] [place], [place] [address]  Lipase, blood     Status: Abnormal   Collection Time: 03/19/20  8:46 PM  Result Value Ref Range   Lipase 94 (H) 11 - 51 U/L    Comment: Performed at [facility] [address], [place], [place] [address]  TSH     Status: None   Collection Time: 03/19/20  8:46 PM  Result Value Ref Range   TSH 1.635 0.350 - 4.500 uIU/mL    Comment: Performed by a 3rd Generation assay with a functional sensitivity of <=0.01 uIU/mL. Performed at [facility], [place] [address], [place], [place] [address]   Ammonia     Status: None   Collection Time: 03/19/20  8:46 PM  Result Value Ref Range     Ammonia 31 9 - 35 umol/L    Comment: Performed at [facility], [place] [address] [place], [place] [address]  Vitamin B12     Status: None   Collection Time: 03/19/20  8:46 PM  Result Value Ref Range   Vitamin B-12 806 180 - 914 pg/mL    Comment: (NOTE) This assay is not validated for testing neonatal or myeloproliferative syndrome specimens for Vitamin B12 levels. Performed at [facility], [place] [address], [place], [place] [address]   Folate     Status: None   Collection Time: 03/19/20  8:46 PM  Result Value Ref Range   Folate 18.3 >5.9 ng/mL    Comment: Performed at [facility] [address] [place], [place] [address]  Iron and TIBC     Status: None   Collection Time: 03/19/20  8:46 PM  Result Value Ref Range   Iron 44 28 - 170 ug/dL   TIBC 360 250 - 450 ug/dL   Saturation Ratios 12 10.4 - 31.8 %   UIBC 316 ug/dL    Comment: Performed at  [facility], [place] [address], [place], [place] [address]  Ferritin     Status: None   Collection Time: 03/19/20  8:46 PM  Result Value Ref Range   Ferritin 99 11 - 307 ng/mL    Comment: Performed at [facility] [address], [place], [place] [address]  Reticulocytes     Status: Abnormal   Collection Time: 03/19/20  8:46 PM  Result Value Ref Range   Retic Ct Pct 4.6 (H) 0.4 - 3.1 %   RBC. 3.78 (L) 3.87 - 5.11 MIL/uL   Retic Count, Absolute 172.4 19.0 - 186.0 K/uL   Immature Retic Fract 21.7 (H) 2.3 - 15.9 %    Comment: Performed at [facility] [address], [place], [place] [address]  Lactic acid, plasma     Status: Abnormal   Collection Time: 03/19/20  8:46 PM  Result Value Ref Range   Lactic Acid, Venous 8.8 (HH) 0.5 - 1.9 mmol/L    Comment: CRITICAL RESULT CALLED TO, READ BACK BY AND VERIFIED WITH: Venora Maples 03/19/20 2230 WAYK Performed at [facility], [place] [address] [place], [place] [address]   Respiratory Panel by RT PCR (Flu A&B, Covid) - Nasopharyngeal Swab     Status: None    Collection Time: 03/19/20  9:37 PM   Specimen: Nasopharyngeal Swab  Result Value Ref Range   SARS Coronavirus 2 by RT PCR NEGATIVE NEGATIVE    Comment: (NOTE) SARS-CoV-2 target nucleic acids are NOT DETECTED.  The SARS-CoV-2 RNA is generally detectable in upper respiratoy specimens during the acute phase of infection. The lowest concentration of SARS-CoV-2 viral copies this assay can detect is 131 copies/mL. A negative result does not preclude SARS-Cov-2 infection and should not be used as the sole basis for treatment or other patient management decisions. A negative result may occur with  improper specimen collection/handling, submission of specimen other than nasopharyngeal swab, presence of viral mutation(s) within the areas targeted by this assay, and inadequate number of viral copies (<131 copies/mL). A negative result must be combined with clinical observations, patient history, and epidemiological information. The expected result is Negative.  Fact Sheet for Patients:  PinkCheek.be  Fact Sheet for Healthcare Providers:  GravelBags.it  This test is no t yet approved or cleared by the [place] FDA and  has been authorized for detection and/or diagnosis of SARS-CoV-2 by FDA under an Emergency Use Authorization (EUA). This EUA will remain  in effect (meaning this test can be used) for the duration of the COVID-19 declaration under Section 564(b)(1) of the Act, 21 U.S.C. section 360bbb-3(b)(1), unless the authorization is terminated or revoked sooner.     Influenza A by PCR NEGATIVE NEGATIVE   Influenza B by PCR NEGATIVE NEGATIVE    Comment: (NOTE) The Xpert Xpress SARS-CoV-2/FLU/RSV assay is intended as an aid in  the diagnosis of influenza from Nasopharyngeal swab specimens and  should not be used as a sole basis for treatment. Nasal washings and  aspirates are unacceptable for Xpert Xpress  SARS-CoV-2/FLU/RSV  testing.  Fact Sheet for Patients: PinkCheek.be  Fact Sheet for Healthcare Providers: GravelBags.it  This test is not yet approved or cleared by the [place] FDA and  has been authorized for detection and/or diagnosis of SARS-CoV-2 by  FDA under an Emergency Use Authorization (EUA). This EUA will remain  in effect (meaning this test can be used) for the duration of the  Covid-19 declaration under Section 564(b)(1) of  the Act, 21  U.S.C. section 360bbb-3(b)(1), unless the authorization is  terminated or revoked. Performed at [facility], [place] [address] [place], [place] [address]   CBG monitoring, ED     Status: Abnormal   Collection Time: 03/19/20 10:28 PM  Result Value Ref Range   Glucose-Capillary 335 (H) 70 - 99 mg/dL    Comment: Glucose reference range applies only to samples taken after fasting for at least 8 hours.  Basic metabolic panel     Status: Abnormal   Collection Time: 03/20/20 12:07 AM  Result Value Ref Range   Sodium 117 (LL) 135 - 145 mmol/L    Comment: CRITICAL RESULT CALLED TO, READ BACK BY AND VERIFIED WITH: ISLEY K,RN 03/20/20 0100 WAYK    Potassium 4.5 3.5 - 5.1 mmol/L   Chloride 85 (L) 98 - 111 mmol/L   CO2 16 (L) 22 - 32 mmol/L   Glucose, Bld 379 (H) 70 - 99 mg/dL    Comment: Glucose reference range applies only to samples taken after fasting for at least 8 hours.   BUN 27 (H) 8 - 23 mg/dL   Creatinine, Ser 1.60 (H) 0.44 - 1.00 mg/dL   Calcium 8.4 (L) 8.9 - 10.3 mg/dL   GFR, Estimated 33 (L) >60 mL/min    Comment: (NOTE) Calculated using the CKD-EPI Creatinine Equation (2021)    Anion gap 16 (H) 5 - 15    Comment: Performed at [facility] [address], [place], [place] [address]  Brain natriuretic peptide     Status: Abnormal   Collection Time: 03/20/20 12:07 AM  Result Value Ref Range   B Natriuretic Peptide 1,085.8 (H) 0.0 - 100.0 pg/mL     Comment: Performed at [facility] [address], [place], [place] [address]  Lactic acid, plasma     Status: Abnormal   Collection Time: 03/20/20 12:07 AM  Result Value Ref Range   Lactic Acid, Venous 6.7 (HH) 0.5 - 1.9 mmol/L    Comment: CRITICAL VALUE NOTED.  VALUE IS CONSISTENT WITH PREVIOUSLY REPORTED AND CALLED VALUE. Performed at [facility], [place] [address] [place], [place] [address]   Urinalysis, Routine w reflex microscopic Urine, Clean Catch     Status: Abnormal   Collection Time: 03/20/20 12:23 AM  Result Value Ref Range   Color, Urine YELLOW YELLOW   APPearance CLEAR CLEAR   Specific Gravity, Urine 1.016 1.005 - 1.030   pH 5.0 5.0 - 8.0   Glucose, UA >=500 (A) NEGATIVE mg/dL   Hgb urine dipstick NEGATIVE NEGATIVE   Bilirubin Urine NEGATIVE NEGATIVE   Ketones, ur NEGATIVE NEGATIVE mg/dL   Protein, ur 30 (A) NEGATIVE mg/dL   Nitrite NEGATIVE NEGATIVE   Leukocytes,Ua NEGATIVE NEGATIVE   RBC / HPF 0-5 0 - 5 RBC/hpf   WBC, UA 0-5 0 - 5 WBC/hpf   Bacteria, UA NONE SEEN NONE SEEN   Squamous Epithelial / LPF 0-5 0 - 5    Comment: Performed at [facility], [place] [address] [place], [place] [address]    CT Head Wo Contrast  Result Date: 03/19/2020 CLINICAL DATA:  Mental status change, unknown cause. Altered mental status. Shortness of breath and chest pain. EXAM: CT HEAD WITHOUT CONTRAST TECHNIQUE: Contiguous axial images were obtained from the base of the skull through the vertex without intravenous contrast. COMPARISON:  No pertinent prior exams are available for comparison. FINDINGS: Brain: Mild generalized cerebral atrophy. Moderate ill-defined hypoattenuation within  the cerebral [place] matter is nonspecific, but compatible chronic small vessel ischemic disease. There is no acute intracranial hemorrhage. No demarcated cortical infarct. No extra-axial fluid collection. No evidence of intracranial mass. No midline shift. Vascular: No hyperdense vessel.   Atherosclerotic calcifications. Skull: No acute calvarial fracture or aggressive osseous lesion. Sinuses/Orbits: Visualized orbits show no acute finding. Trace ethmoid sinus mucosal thickening. No significant mastoid effusion. IMPRESSION: No evidence of acute intracranial abnormality. Mild cerebral atrophy with moderate chronic small vessel ischemic disease. Electronically Signed   By: Kellie Simmering DO   On: 03/19/2020 19:04   CT ABDOMEN PELVIS W CONTRAST  Result Date: 03/20/2020 CLINICAL DATA:  Abdominal pain EXAM: CT ABDOMEN AND PELVIS WITH CONTRAST TECHNIQUE: Multidetector CT imaging of the abdomen and pelvis was performed using the standard protocol following bolus administration of intravenous contrast. CONTRAST:  144m OMNIPAQUE IOHEXOL 300 MG/ML  SOLN COMPARISON:  Ultrasound 03/19/2020 FINDINGS: Lower chest: Small bilateral pleural effusions. Bibasilar atelectasis. Hepatobiliary: Small high-density structure in the region of the gallbladder neck, likely small stone. Other stones that were seen on prior ultrasound not visualized. Pericholecystic fluid noted. No focal hepatic abnormality. Pancreas: No focal abnormality or ductal dilatation. Spleen: No focal abnormality.  Normal size. Adrenals/Urinary Tract: No renal or adrenal mass. No hydronephrosis. Urinary bladder unremarkable. Stomach/Bowel: Stomach, large and small bowel grossly unremarkable. Normal appendix. Vascular/Lymphatic: Heavily calcified aorta and iliac vessels. No evidence of aneurysm or adenopathy. Reproductive: Uterus and adnexa unremarkable.  No mass. Other: No free fluid or free air. Musculoskeletal: No acute bony abnormality. Degenerative changes in the lumbar spine. IMPRESSION: Small gallstone seen within the region of the gallbladder neck. Pericholecystic fluid seen around the gallbladder. In combination with findings on earlier ultrasound, findings are suggestive of acute cholecystitis. Small bilateral pleural effusions with bibasilar  atelectasis. Aortic atherosclerosis. Electronically Signed   By: KRolm BaptiseM.D.   On: 03/20/2020 00:16   DG Chest Portable 1 View  Result Date: 03/19/2020 CLINICAL DATA:  Shortness of breath EXAM: PORTABLE CHEST 1 VIEW COMPARISON:  02/22/2020 FINDINGS: Cardiac shadow is enlarged but stable. Postsurgical changes are again seen. Aortic calcifications are noted. The lungs are well aerated bilaterally. Patchy right basilar infiltrate is seen. Some mild interstitial changes are noted which may represent mild edema. No bony abnormality is seen. IMPRESSION: Changes of mild edema and new right basilar infiltrate. Electronically Signed   By: MInez CatalinaM.D.   On: 03/19/2020 17:09   [place]Abdomen Limited RUQ (LIVER/GB)  Result Date: 03/19/2020 CLINICAL DATA:  Elevated LFTs EXAM: ULTRASOUND ABDOMEN LIMITED RIGHT UPPER QUADRANT COMPARISON:  None. FINDINGS: Gallbladder: The gallbladder is not well visualized however there appears to be diffusely thickened gallbladder wall measuring 1.6 cm. There is a calculi present measuring 1.1 cm. Small amount of pericholecystic fluid and gallbladder sludge is present. Common bile duct: Diameter: 4.2 mm Liver: No focal lesion identified. Within normal limits in parenchymal echogenicity. Portal vein is patent on color Doppler imaging with normal direction of blood flow towards the liver. Other: None. IMPRESSION: Findings suggestive of acute cholecystitis. Electronically Signed   By: BPrudencio PairM.D.   On: 03/19/2020 22:26    ROS - all of the below systems have been reviewed with the patient and positives are indicated with bold text General: chills, fever or night sweats Eyes: blurry vision or double vision ENT: epistaxis or sore throat Allergy/Immunology: itchy/watery eyes or nasal congestion Hematologic/Lymphatic: bleeding problems, blood clots or swollen lymph nodes Endocrine: temperature intolerance or unexpected weight changes  Breast: new or changing breast lumps or  nipple discharge Resp: cough, shortness of breath, or wheezing CV: chest pain or dyspnea on exertion GI: as per HPI GU: dysuria, trouble voiding, or hematuria MSK: joint pain or joint stiffness Neuro: TIA or stroke symptoms Derm: pruritus and skin lesion changes Psych: anxiety and depression  PE Blood pressure 132/77, pulse 79, temperature 97.9 F (36.6 C), temperature source Oral, resp. rate (!) 23, height 5' (1.524 m), weight 53.6 kg, SpO2 100 %. Constitutional: Uncomfortable, in respiratory distress; wearing bipap; no deformities Eyes: Moist conjunctiva; no lid lag; anicteric; PERRL Neck: Trachea midline; no thyromegaly Lungs: Increased respiratory effort; coarse breath sounds bilaterally CV: RRR; no palpable thrills GI: Abd soft, mildly ttp in bilateral upper quadrants; nondistended; no palpable hepatosplenomegaly MSK: Normal range of motion of extremities; no clubbing/cyanosis Psychiatric: Unable to assess 2/2 language barrier Lymphatic: No palpable cervical or axillary lymphadenopathy  Results for orders placed or performed during the hospital encounter of 03/19/20 (from the past 48 hour(s))  POC CBG, ED     Status: Abnormal   Collection Time: 03/19/20  4:54 PM  Result Value Ref Range   Glucose-Capillary 407 (H) 70 - 99 mg/dL    Comment: Glucose reference range applies only to samples taken after fasting for at least 8 hours.   Comment 1 Notify RN    Comment 2 Document in Chart   I-Stat arterial blood gas, [facility] ED)     Status: Abnormal   Collection Time: 03/19/20  5:24 PM  Result Value Ref Range   pH, Arterial 7.307 (L) 7.35 - 7.45   pCO2 arterial 34.4 32 - 48 mmHg   pO2, Arterial 40 (LL) 83 - 108 mmHg   Bicarbonate 17.2 (L) 20.0 - 28.0 mmol/L   TCO2 18 (L) 22 - 32 mmol/L   O2 Saturation 70.0 %   Acid-base deficit 8.0 (H) 0.0 - 2.0 mmol/L   Sodium 117 (LL) 135 - 145 mmol/L   Potassium 4.1 3.5 - 5.1 mmol/L   Calcium, Ion 1.14 (L) 1.15 - 1.40 mmol/L   HCT 31.0 (L) 36 -  46 %   Hemoglobin 10.5 (L) 12.0 - 15.0 g/dL   Collection site Radial    Drawn by RT    Sample type ARTERIAL    Comment NOTIFIED PHYSICIAN   CBC with Differential     Status: Abnormal   Collection Time: 03/19/20  5:59 PM  Result Value Ref Range   WBC 7.4 4.0 - 10.5 K/uL   RBC 3.72 (L) 3.87 - 5.11 MIL/uL   Hemoglobin 9.6 (L) 12.0 - 15.0 g/dL   HCT 29.5 (L) 36 - 46 %   MCV 79.3 (L) 80.0 - 100.0 fL   MCH 25.8 (L) 26.0 - 34.0 pg   MCHC 32.5 30.0 - 36.0 g/dL   RDW 12.3 11.5 - 15.5 %   Platelets 199 150 - 400 K/uL   nRBC 0.0 0.0 - 0.2 %   Neutrophils Relative % 81 %   Neutro Abs 6.0 1.7 - 7.7 K/uL   Lymphocytes Relative 13 %   Lymphs Abs 1.0 0.7 - 4.0 K/uL   Monocytes Relative 6 %   Monocytes Absolute 0.4 0.1 - 1.0 K/uL   Eosinophils Relative 0 %   Eosinophils Absolute 0.0 0.0 - 0.5 K/uL   Basophils Relative 0 %   Basophils Absolute 0.0 0.0 - 0.1 K/uL   Immature Granulocytes 0 %   Abs Immature Granulocytes 0.03 0.00 - 0.07 K/uL  Comment: Performed at [facility], [place] [address], [place], [place] [address]  Comprehensive metabolic panel     Status: Abnormal   Collection Time: 03/19/20  5:59 PM  Result Value Ref Range   Sodium 116 (LL) 135 - 145 mmol/L    Comment: CRITICAL RESULT CALLED TO, READ BACK BY AND VERIFIED WITH: RN Resa Miner AT 1900 03/19/20 BY L BENFIELD    Potassium 4.2 3.5 - 5.1 mmol/L   Chloride 84 (L) 98 - 111 mmol/L   CO2 14 (L) 22 - 32 mmol/L   Glucose, Bld 404 (H) 70 - 99 mg/dL    Comment: Glucose reference range applies only to samples taken after fasting for at least 8 hours.   BUN 25 (H) 8 - 23 mg/dL   Creatinine, Ser 1.72 (H) 0.44 - 1.00 mg/dL   Calcium 9.2 8.9 - 10.3 mg/dL   Total Protein 7.5 6.5 - 8.1 g/dL   Albumin 4.2 3.5 - 5.0 g/dL   AST 262 (H) 15 - 41 U/L   ALT 247 (H) 0 - 44 U/L   Alkaline Phosphatase 99 38 - 126 U/L   Total Bilirubin 1.1 0.3 - 1.2 mg/dL   GFR, Estimated 31 (L) >60 mL/min    Comment: (NOTE) Calculated using the  CKD-EPI Creatinine Equation (2021)    Anion gap 18 (H) 5 - 15    Comment: Performed at [facility], [place] [address], [place], [place] [address]  Troponin I (High Sensitivity)     Status: None   Collection Time: 03/19/20  5:59 PM  Result Value Ref Range   Troponin I (High Sensitivity) 9 <18 ng/L    Comment: Performed at [facility] [address] [place], [place] [address]  CBG monitoring, ED     Status: Abnormal   Collection Time: 03/19/20  8:39 PM  Result Value Ref Range   Glucose-Capillary 388 (H) 70 - 99 mg/dL    Comment: Glucose reference range applies only to samples taken after fasting for at least 8 hours.  Troponin I (High Sensitivity)     Status: None   Collection Time: 03/19/20  8:46 PM  Result Value Ref Range   Troponin I (High Sensitivity) 11 <18 ng/L    Comment: (NOTE) Elevated high sensitivity troponin I (hsTnI) values and significant  changes across serial measurements may suggest ACS but many other  chronic and acute conditions are known to elevate hsTnI results.  Refer to the Links section for chest pain algorithms and additional  guidance. Performed at [facility], [place] [address] [place], [place] [address]   Basic metabolic panel     Status: Abnormal   Collection Time: 03/19/20  8:46 PM  Result Value Ref Range   Sodium 116 (LL) 135 - 145 mmol/L    Comment: CRITICAL RESULT CALLED TO, READ BACK BY AND VERIFIED WITH: ISLEY K,RN 03/19/20 2152 WAYK    Potassium 4.1 3.5 - 5.1 mmol/L   Chloride 82 (L) 98 - 111 mmol/L   CO2 13 (L) 22 - 32 mmol/L   Glucose, Bld 400 (H) 70 - 99 mg/dL    Comment: Glucose reference range applies only to samples taken after fasting for at least 8 hours.   BUN 27 (H) 8 - 23 mg/dL   Creatinine, Ser 1.66 (H) 0.44 - 1.00 mg/dL   Calcium 9.3 8.9 - 10.3 mg/dL   GFR, Estimated 32 (L) >60 mL/min    Comment: (NOTE) Calculated using  the CKD-EPI Creatinine Equation (2021)    Anion gap 21 (H) 5 - 15    Comment: Performed  at [facility], [place] [address], [place], [place] [address]  Beta-hydroxybutyric acid     Status: None   Collection Time: 03/19/20  8:46 PM  Result Value Ref Range   Beta-Hydroxybutyric Acid 0.26 0.05 - 0.27 mmol/L    Comment: Performed at [facility] [address] [place], [place] [address]  Lipase, blood     Status: Abnormal   Collection Time: 03/19/20  8:46 PM  Result Value Ref Range   Lipase 94 (H) 11 - 51 U/L    Comment: Performed at [facility] [address], [place], [place] [address]  TSH     Status: None   Collection Time: 03/19/20  8:46 PM  Result Value Ref Range   TSH 1.635 0.350 - 4.500 uIU/mL    Comment: Performed by a 3rd Generation assay with a functional sensitivity of <=0.01 uIU/mL. Performed at [facility], [place] [address], [place], [place] [address]   Ammonia     Status: None   Collection Time: 03/19/20  8:46 PM  Result Value Ref Range   Ammonia 31 9 - 35 umol/L    Comment: Performed at [facility], [place] [address], [place], [place] [address]  Vitamin B12     Status: None   Collection Time: 03/19/20  8:46 PM  Result Value Ref Range   Vitamin B-12 806 180 - 914 pg/mL    Comment: (NOTE) This assay is not validated for testing neonatal or myeloproliferative syndrome specimens for Vitamin B12 levels. Performed at [facility], [place] [address], [place], [place] [address]   Folate     Status: None   Collection Time: 03/19/20  8:46 PM  Result Value Ref Range   Folate 18.3 >5.9 ng/mL    Comment: Performed at [facility] [address], [place], [place] [address]  Iron and TIBC     Status: None   Collection Time: 03/19/20  8:46 PM  Result Value Ref Range   Iron 44 28 - 170 ug/dL   TIBC 360 250 - 450 ug/dL   Saturation Ratios 12 10.4 - 31.8 %   UIBC 316 ug/dL    Comment: Performed at [facility], [place] [address] [place], [place] [address]  Ferritin     Status: None   Collection Time: 03/19/20  8:46 PM   Result Value Ref Range   Ferritin 99 11 - 307 ng/mL    Comment: Performed at [facility] [address] [place], [place] [address]  Reticulocytes     Status: Abnormal   Collection Time: 03/19/20  8:46 PM  Result Value Ref Range   Retic Ct Pct 4.6 (H) 0.4 - 3.1 %   RBC. 3.78 (L) 3.87 - 5.11 MIL/uL   Retic Count, Absolute 172.4 19.0 - 186.0 K/uL   Immature Retic Fract 21.7 (H) 2.3 - 15.9 %    Comment: Performed at [facility] [address] [place], [place] [address]  Lactic acid, plasma     Status: Abnormal   Collection Time: 03/19/20  8:46 PM  Result Value Ref Range   Lactic Acid, Venous 8.8 (HH) 0.5 - 1.9 mmol/L    Comment: CRITICAL RESULT CALLED TO, READ BACK BY AND VERIFIED WITH: Venora Maples 03/19/20 2230 WAYK Performed at [facility],  [address], [place], [place] [address]   Respiratory Panel by RT PCR (Flu A&B, Covid) - Nasopharyngeal Swab     Status: None   Collection Time: 03/19/20  9:37 PM   Specimen: Nasopharyngeal Swab  Result Value Ref Range   SARS Coronavirus 2 by RT PCR NEGATIVE NEGATIVE    Comment: (NOTE) SARS-CoV-2 target nucleic acids are NOT DETECTED.  The SARS-CoV-2 RNA is generally detectable in upper respiratoy specimens during the acute phase of infection. The lowest concentration of SARS-CoV-2 viral copies this assay can detect is 131 copies/mL. A negative result does not preclude SARS-Cov-2 infection and should not be used as the sole basis for treatment or other patient management decisions. A negative result may occur with  improper specimen collection/handling, submission of specimen other than nasopharyngeal swab, presence of viral mutation(s) within the areas targeted by this assay, and inadequate number of viral copies (<131 copies/mL). A negative result must be combined with clinical observations, patient history, and epidemiological information. The expected result is Negative.  Fact Sheet for Patients:   PinkCheek.be  Fact Sheet for Healthcare Providers:  GravelBags.it  This test is no t yet approved or cleared by the [place] FDA and  has been authorized for detection and/or diagnosis of SARS-CoV-2 by FDA under an Emergency Use Authorization (EUA). This EUA will remain  in effect (meaning this test can be used) for the duration of the COVID-19 declaration under Section 564(b)(1) of the Act, 21 U.S.C. section 360bbb-3(b)(1), unless the authorization is terminated or revoked sooner.     Influenza A by PCR NEGATIVE NEGATIVE   Influenza B by PCR NEGATIVE NEGATIVE    Comment: (NOTE) The Xpert Xpress SARS-CoV-2/FLU/RSV assay is intended as an aid in  the diagnosis of influenza from Nasopharyngeal swab specimens and  should not be used as a sole basis for treatment. Nasal washings and  aspirates are unacceptable for Xpert Xpress SARS-CoV-2/FLU/RSV  testing.  Fact Sheet for Patients: PinkCheek.be  Fact Sheet for Healthcare Providers: GravelBags.it  This test is not yet approved or cleared by the [place] FDA and  has been authorized for detection and/or diagnosis of SARS-CoV-2 by  FDA under an Emergency Use Authorization (EUA). This EUA will remain  in effect (meaning this test can be used) for the duration of the  Covid-19 declaration under Section 564(b)(1) of the Act, 21  U.S.C. section 360bbb-3(b)(1), unless the authorization is  terminated or revoked. Performed at [facility], [place] [address] [place], [place] [address]   CBG monitoring, ED     Status: Abnormal   Collection Time: 03/19/20 10:28 PM  Result Value Ref Range   Glucose-Capillary 335 (H) 70 - 99 mg/dL    Comment: Glucose reference range applies only to samples taken after fasting for at least 8 hours.  Basic metabolic panel     Status: Abnormal   Collection Time: 03/20/20 12:07 AM   Result Value Ref Range   Sodium 117 (LL) 135 - 145 mmol/L    Comment: CRITICAL RESULT CALLED TO, READ BACK BY AND VERIFIED WITH: ISLEY K,RN 03/20/20 0100 WAYK    Potassium 4.5 3.5 - 5.1 mmol/L   Chloride 85 (L) 98 - 111 mmol/L   CO2 16 (L) 22 - 32 mmol/L   Glucose, Bld 379 (H) 70 - 99 mg/dL    Comment: Glucose reference range applies only to samples taken after fasting for at least 8 hours.   BUN 27 (H) 8 - 23 mg/dL  Creatinine, Ser 1.60 (H) 0.44 - 1.00 mg/dL   Calcium 8.4 (L) 8.9 - 10.3 mg/dL   GFR, Estimated 33 (L) >60 mL/min    Comment: (NOTE) Calculated using the CKD-EPI Creatinine Equation (2021)    Anion gap 16 (H) 5 - 15    Comment: Performed at [facility] [address] [place], [place] [address]  Brain natriuretic peptide     Status: Abnormal   Collection Time: 03/20/20 12:07 AM  Result Value Ref Range   B Natriuretic Peptide 1,085.8 (H) 0.0 - 100.0 pg/mL    Comment: Performed at [facility] [address] [place], [place] [address]  Lactic acid, plasma     Status: Abnormal   Collection Time: 03/20/20 12:07 AM  Result Value Ref Range   Lactic Acid, Venous 6.7 (HH) 0.5 - 1.9 mmol/L    Comment: CRITICAL VALUE NOTED.  VALUE IS CONSISTENT WITH PREVIOUSLY REPORTED AND CALLED VALUE. Performed at [facility], [place] [address] [place], [place] [address]   Urinalysis, Routine w reflex microscopic Urine, Clean Catch     Status: Abnormal   Collection Time: 03/20/20 12:23 AM  Result Value Ref Range   Color, Urine YELLOW YELLOW   APPearance CLEAR CLEAR   Specific Gravity, Urine 1.016 1.005 - 1.030   pH 5.0 5.0 - 8.0   Glucose, UA >=500 (A) NEGATIVE mg/dL   Hgb urine dipstick NEGATIVE NEGATIVE   Bilirubin Urine NEGATIVE NEGATIVE   Ketones, ur NEGATIVE NEGATIVE mg/dL   Protein, ur 30 (A) NEGATIVE mg/dL   Nitrite NEGATIVE NEGATIVE   Leukocytes,Ua NEGATIVE NEGATIVE   RBC / HPF 0-5 0 - 5 RBC/hpf   WBC, UA 0-5 0 - 5 WBC/hpf   Bacteria, UA NONE SEEN  NONE SEEN   Squamous Epithelial / LPF 0-5 0 - 5    Comment: Performed at [facility], [place] [address], [place],  [address]    CT Head Wo Contrast  Result Date: 03/19/2020 CLINICAL DATA:  Mental status change, unknown cause. Altered mental status. Shortness of breath and chest pain. EXAM: CT HEAD WITHOUT CONTRAST TECHNIQUE: Contiguous axial images were obtained from the base of the skull through the vertex without intravenous contrast. COMPARISON:  No pertinent prior exams are available for comparison. FINDINGS: Brain: Mild generalized cerebral atrophy. Moderate ill-defined hypoattenuation within the cerebral Daija Routson matter is nonspecific, but compatible chronic small vessel ischemic disease. There is no acute intracranial hemorrhage. No demarcated cortical infarct. No extra-axial fluid collection. No evidence of intracranial mass. No midline shift. Vascular: No hyperdense vessel.  Atherosclerotic calcifications. Skull: No acute calvarial fracture or aggressive osseous lesion. Sinuses/Orbits: Visualized orbits show no acute finding. Trace ethmoid sinus mucosal thickening. No significant mastoid effusion. IMPRESSION: No evidence of acute intracranial abnormality. Mild cerebral atrophy with moderate chronic small vessel ischemic disease. Electronically Signed   By: Kellie Simmering DO   On: 03/19/2020 19:04   CT ABDOMEN PELVIS W CONTRAST  Result Date: 03/20/2020 CLINICAL DATA:  Abdominal pain EXAM: CT ABDOMEN AND PELVIS WITH CONTRAST TECHNIQUE: Multidetector CT imaging of the abdomen and pelvis was performed using the standard protocol following bolus administration of intravenous contrast. CONTRAST:  179m OMNIPAQUE IOHEXOL 300 MG/ML  SOLN COMPARISON:  Ultrasound 03/19/2020 FINDINGS: Lower chest: Small bilateral pleural effusions. Bibasilar atelectasis. Hepatobiliary: Small high-density structure in the region of the gallbladder neck, likely small stone. Other stones that were seen on prior  ultrasound not visualized. Pericholecystic fluid noted. No focal hepatic abnormality.  Pancreas: No focal abnormality or ductal dilatation. Spleen: No focal abnormality.  Normal size. Adrenals/Urinary Tract: No renal or adrenal mass. No hydronephrosis. Urinary bladder unremarkable. Stomach/Bowel: Stomach, large and small bowel grossly unremarkable. Normal appendix. Vascular/Lymphatic: Heavily calcified aorta and iliac vessels. No evidence of aneurysm or adenopathy. Reproductive: Uterus and adnexa unremarkable.  No mass. Other: No free fluid or free air. Musculoskeletal: No acute bony abnormality. Degenerative changes in the lumbar spine. IMPRESSION: Small gallstone seen within the region of the gallbladder neck. Pericholecystic fluid seen around the gallbladder. In combination with findings on earlier ultrasound, findings are suggestive of acute cholecystitis. Small bilateral pleural effusions with bibasilar atelectasis. Aortic atherosclerosis. Electronically Signed   By: Rolm Baptise M.D.   On: 03/20/2020 00:16   DG Chest Portable 1 View  Result Date: 03/19/2020 CLINICAL DATA:  Shortness of breath EXAM: PORTABLE CHEST 1 VIEW COMPARISON:  02/22/2020 FINDINGS: Cardiac shadow is enlarged but stable. Postsurgical changes are again seen. Aortic calcifications are noted. The lungs are well aerated bilaterally. Patchy right basilar infiltrate is seen. Some mild interstitial changes are noted which may represent mild edema. No bony abnormality is seen. IMPRESSION: Changes of mild edema and new right basilar infiltrate. Electronically Signed   By: Inez Catalina M.D.   On: 03/19/2020 17:09   US Abdomen Limited RUQ (LIVER/GB)  Result Date: 03/19/2020 CLINICAL DATA:  Elevated LFTs EXAM: ULTRASOUND ABDOMEN LIMITED RIGHT UPPER QUADRANT COMPARISON:  None. FINDINGS: Gallbladder: The gallbladder is not well visualized however there appears to be diffusely thickened gallbladder wall measuring 1.6 cm. There is a calculi present  measuring 1.1 cm. Small amount of pericholecystic fluid and gallbladder sludge is present. Common bile duct: Diameter: 4.2 mm Liver: No focal lesion identified. Within normal limits in parenchymal echogenicity. Portal vein is patent on color Doppler imaging with normal direction of blood flow towards the liver. Other: None. IMPRESSION: Findings suggestive of acute cholecystitis. Electronically Signed   By: Prudencio Pair M.D.   On: 03/19/2020 22:26     A/P: Ladawn Boullion is an 74 y.o. female with HTN, HLD, GERD, whom we are asked to see for possibility of cholecystitis  -She is currently admitted to MICU for respirator monitoring in setting of AKI, acute liver injury, possible DKA - respiratory distress - constellation of findings worrisome for acute heart failure -NPO, MIVF, empiric abx coverage for cholecystitis - IV Zosyn -Ultimately, can obtain HIDA scan for further evaluation as many imaging findings of 'cholecystitis' can be explained by edema 2/2 right heart failure -We will follow with you  Sharon Mt. Dema Severin, M.D. [facility] Use [email or web address] to contact on call provider

## 2020-03-21 DIAGNOSIS — E1110 Type 2 diabetes mellitus with ketoacidosis without coma: Secondary | ICD-10-CM | POA: Diagnosis not present

## 2020-03-21 DIAGNOSIS — J9601 Acute respiratory failure with hypoxia: Secondary | ICD-10-CM | POA: Diagnosis not present

## 2020-03-21 DIAGNOSIS — R4182 Altered mental status, unspecified: Secondary | ICD-10-CM | POA: Diagnosis not present

## 2020-03-21 DIAGNOSIS — N179 Acute kidney failure, unspecified: Secondary | ICD-10-CM

## 2020-03-21 DIAGNOSIS — J810 Acute pulmonary edema: Secondary | ICD-10-CM

## 2020-03-21 LAB — CBC
HCT: 23.1 % — ABNORMAL LOW (ref 36.0–46.0)
Hemoglobin: 8 g/dL — ABNORMAL LOW (ref 12.0–15.0)
MCH: 26.1 pg (ref 26.0–34.0)
MCHC: 34.6 g/dL (ref 30.0–36.0)
MCV: 75.5 fL — ABNORMAL LOW (ref 80.0–100.0)
Platelets: 166 10*3/uL (ref 150–400)
RBC: 3.06 MIL/uL — ABNORMAL LOW (ref 3.87–5.11)
RDW: 12.9 % (ref 11.5–15.5)
WBC: 8.7 10*3/uL (ref 4.0–10.5)
nRBC: 0 % (ref 0.0–0.2)

## 2020-03-21 LAB — GLUCOSE, CAPILLARY
Glucose-Capillary: 105 mg/dL — ABNORMAL HIGH (ref 70–99)
Glucose-Capillary: 105 mg/dL — ABNORMAL HIGH (ref 70–99)
Glucose-Capillary: 115 mg/dL — ABNORMAL HIGH (ref 70–99)
Glucose-Capillary: 142 mg/dL — ABNORMAL HIGH (ref 70–99)
Glucose-Capillary: 144 mg/dL — ABNORMAL HIGH (ref 70–99)
Glucose-Capillary: 60 mg/dL — ABNORMAL LOW (ref 70–99)
Glucose-Capillary: 85 mg/dL (ref 70–99)

## 2020-03-21 LAB — BASIC METABOLIC PANEL
Anion gap: 17 — ABNORMAL HIGH (ref 5–15)
BUN: 29 mg/dL — ABNORMAL HIGH (ref 8–23)
CO2: 22 mmol/L (ref 22–32)
Calcium: 9.8 mg/dL (ref 8.9–10.3)
Chloride: 90 mmol/L — ABNORMAL LOW (ref 98–111)
Creatinine, Ser: 2.35 mg/dL — ABNORMAL HIGH (ref 0.44–1.00)
GFR, Estimated: 21 mL/min — ABNORMAL LOW (ref >60)
Glucose, Bld: 144 mg/dL — ABNORMAL HIGH (ref 70–99)
Potassium: 3.3 mmol/L — ABNORMAL LOW (ref 3.5–5.1)
Sodium: 129 mmol/L — ABNORMAL LOW (ref 135–145)

## 2020-03-21 LAB — MRSA PCR SCREENING: MRSA by PCR: NEGATIVE

## 2020-03-21 LAB — PHOSPHORUS: Phosphorus: 4.2 mg/dL (ref 2.5–4.6)

## 2020-03-21 LAB — MAGNESIUM: Magnesium: 2.6 mg/dL — ABNORMAL HIGH (ref 1.7–2.4)

## 2020-03-21 MED ORDER — INSULIN ASPART 100 UNIT/ML ~~LOC~~ SOLN
0.0000 [IU] | SUBCUTANEOUS | Status: DC
  Administered 2020-03-22 (×2): 1 [IU] via SUBCUTANEOUS

## 2020-03-21 MED ORDER — DEXTROSE 5 % IV SOLN: INTRAVENOUS | Status: DC

## 2020-03-21 MED ORDER — DEXTROSE 50 % IV SOLN
12.5000 g | INTRAVENOUS | Status: AC
  Administered 2020-03-21: 12.5 g via INTRAVENOUS

## 2020-03-21 MED ORDER — INSULIN DETEMIR 100 UNIT/ML ~~LOC~~ SOLN
10.0000 [IU] | Freq: Every day | SUBCUTANEOUS | Status: DC
  Filled 2020-03-21: qty 0.1

## 2020-03-21 MED ORDER — POTASSIUM CHLORIDE 10 MEQ/100ML IV SOLN
10.0000 meq | INTRAVENOUS | Status: AC
  Administered 2020-03-21 (×3): 10 meq via INTRAVENOUS
  Filled 2020-03-21: qty 100

## 2020-03-21 MED ORDER — POTASSIUM CHLORIDE 10 MEQ/100ML IV SOLN
10.0000 meq | INTRAVENOUS | Status: AC
  Administered 2020-03-21: 10 meq via INTRAVENOUS

## 2020-03-21 NOTE — Progress Notes (Signed)
eLink Physician-Brief Progress Note Patient Name: Pinkey Mcjunkin DOB: 1945-05-25 MRN: [id number]   Date of Service  03/21/2020  HPI/Events of Note  Patient needs CBG with SSI coverage as she transitions from an insulin infusion.  eICU Interventions  CBG with SSI insulin coverage Q 4 hours ordered and insulin infusion discontinued.        Kerry Kass Pearle Wandler 03/21/2020, 8:14 PM

## 2020-03-21 NOTE — Progress Notes (Signed)
Writer called Elink regarding increased agitation, no new orders.

## 2020-03-21 NOTE — Progress Notes (Signed)
NAME:  Amanda [name], MRN:  [id number], DOB:  April 15, 1946, LOS: 2 ADMISSION DATE:  03/19/2020, CONSULTATION DATE:  03/19/20 REFERRING MD:  Tegeler, CHIEF COMPLAINT:  SOB   Brief History   74 year old woman presenting with acute hypoxemic respiratory failure and abdominal pain found to have signs of both fluid overload and acute cholecystitis.  History of present illness   74 year old non english speaking woman [place], unable to find interpreter for this) presenting with belly pain and SOB.  Workup in ER reveals acute on chronic hyponatremia, acute liver injury, acute kidney injury, lactic acidosis, and significant hyperglycemia.  [place] RUQ c/w cholecystitis.  Gen Surg called and recommends CT A/P for now, this is pending.  Patient had progressive dyspnea with fluid resuscitation so placed and BIPAP and PCCM consulted.  Past Medical History  HTN DM GERD  Significant Hospital Events   03/19/20 admitted  Consults:  General Surgery  Procedures:  N/A  Significant Diagnostic Tests:  CT A/P >> CXR pulmonary edema RUQ [place] cholecystitis changes  Limited ECHO 11/6: EF 60-65%, grade II diastolic dysfunction. RV systolic function normal. Elevated RVSP 22mmHg. Left atria mildly dilated.  Micro Data:  COVID>>negative Bld Cx>> MRSA negative  Antimicrobials:  Zosyn>>   Interim history/subjective:  Increase WOB during brief trial on [place], back on BiPAP, Cr rising, lasix holiday today  Objective   Blood pressure (!) 145/68, pulse (!) 105, temperature 99.3 F (37.4 C), temperature source Axillary, resp. rate (!) 25, height 5' (1.524 m), weight 53.6 kg, SpO2 99 %.    FiO2 (%):  [40 %-65 %] 60 %   Intake/Output Summary (Last 24 hours) at 03/21/2020 1427 Last data filed at 03/21/2020 1200 Gross per 24 hour  Intake 146.62 ml  Output 1550 ml  Net -1403.38 ml   Filed Weights   03/19/20 1900  Weight: 53.6 kg    Examination: Constitutional: elderly woman in no acute distress Eyes: eyes  are anicteric, reactive to light Ears, nose, mouth, and throat: BIPAP in place with good seal Cardiovascular: heart sounds are regular, ext are warm to touch. trace edema Respiratory: Diminished at bases with crackles, no accessory muscle use, diffuse wheezing Gastrointestinal: abdomen is soft, diffuse TTP Skin: No rashes, normal turgor Neurologic: moves all 4 ext   Resolved Hospital Problem list     Assessment & Plan:  Acute hypoxemic respiratory failure due to pulmonary edema- trop neg, echo last month benign - Strict I/O - BIPAP  - Repeat limited echo, check BNP - scheduled duonebs for wheezing. Unsure if underlying lung disease.  Radiographic findings of Acute cholecystitis - No acute complaints of abdominal pain or fevers - HIDA 11/8 per gen surgery recs - zosyn for now  Acute on chronic kidney injury In setting of respiratory failure and acute heart failure, possible hypovolemia in setting of aggressive diuresis. Cr worsened 11/7. - hold lasix 11/7  Hyperkalemia: -replace K  Lactic acidosis  In setting of heart failure and respiratory failure  - trended down with diuresis and supplemental oxygen  Acute on chronic hyponatremia likely acute phase reactant plus pseudohyponatremia related to hyperglycemia (corrected 123) - Resolving  DM with hyperglycemia, beta hydroxybutyrate neg - transitioned off insulin drip to subq  Language barrier- need to find appropriate translator in AM [name]), big barrier to care  Best practice:  Diet: NPO Pain/Anxiety/Delirium protocol (if indicated): N/A VAP protocol (if indicated): N/A DVT prophylaxis: SCDs pending surgical eval GI prophylaxis: PPI Glucose control: SQ insulin Mobility: BR Code  Status: full Family Communication: As able  Disposition: ICU pending imrpovement in respiratory status     Labs   CBC: Recent Labs  Lab 03/19/20 1724 03/19/20 1759 03/20/20 0340 03/21/20 0242  WBC  --  7.4 6.0 8.7  NEUTROABS   --  6.0  --   --   HGB 10.5* 9.6* 8.0* 8.0*  HCT 31.0* 29.5* 23.7* 23.1*  MCV  --  79.3* 75.7* 75.5*  PLT  --  199 157 458    Basic Metabolic Panel: Recent Labs  Lab 03/19/20 2046 03/20/20 0007 03/20/20 0340 03/20/20 1437 03/21/20 0242 03/21/20 1033  NA 116* 117* 120* 126*  --  129*  K 4.1 4.5 4.5 3.2*  --  3.3*  CL 82* 85* 85* 88*  --  90*  CO2 13* 16* 19* 21*  --  22  GLUCOSE 400* 379* 318* 163*  --  144*  BUN 27* 27* 25* 25*  --  29*  CREATININE 1.66* 1.60* 1.64* 1.63*  --  2.35*  CALCIUM 9.3 8.4* 9.0 9.8  --  9.8  MG  --   --  1.7  --  2.6*  --   PHOS  --   --  3.7  --  4.2  --    GFR: Estimated Creatinine Clearance: 14.9 mL/min (A) (by C-G formula based on SCr of 2.35 mg/dL (H)). Recent Labs  Lab 03/19/20 1759 03/19/20 2046 03/20/20 0007 03/20/20 0340 03/20/20 1437 03/21/20 0242  PROCALCITON  --   --  0.18  --   --   --   WBC 7.4  --   --  6.0  --  8.7  LATICACIDVEN  --  8.8* 6.7* 4.2* 2.5*  --     Liver Function Tests: Recent Labs  Lab 03/19/20 1759 03/20/20 1816  AST 262* 117*  ALT 247* 151*  ALKPHOS 99 75  BILITOT 1.1 1.3*  PROT 7.5 7.8  ALBUMIN 4.2 4.9   Recent Labs  Lab 03/19/20 2046  LIPASE 94*   Recent Labs  Lab 03/19/20 2046  AMMONIA 31    ABG    Component Value Date/Time   PHART 7.307 (L) 03/19/2020 1724   PCO2ART 34.4 03/19/2020 1724   PO2ART 40 (LL) 03/19/2020 1724   HCO3 17.2 (L) 03/19/2020 1724   TCO2 18 (L) 03/19/2020 1724   ACIDBASEDEF 8.0 (H) 03/19/2020 1724   O2SAT 70.0 03/19/2020 1724     Coagulation Profile: No results for input(s): INR, PROTIME in the last 168 hours.  Cardiac Enzymes: No results for input(s): CKTOTAL, CKMB, CKMBINDEX, TROPONINI in the last 168 hours.   Critical care time:     CRITICAL CARE Performed by: Lanier Clam   Total critical care time: 32 minutes  Critical care time was exclusive of separately billable procedures and treating other patients.  Critical care was  necessary to treat or prevent imminent or life-threatening deterioration.  Critical care was time spent personally by me on the following activities: development of treatment plan with patient and/or surrogate as well as nursing, discussions with consultants, evaluation of patient's response to treatment, examination of patient, obtaining history from patient or surrogate, ordering and performing treatments and interventions, ordering and review of laboratory studies, ordering and review of radiographic studies, pulse oximetry and re-evaluation of patient's condition.

## 2020-03-22 ENCOUNTER — Inpatient Hospital Stay (HOSPITAL_COMMUNITY): Payer: Medicare Other

## 2020-03-22 DIAGNOSIS — R4182 Altered mental status, unspecified: Secondary | ICD-10-CM | POA: Diagnosis not present

## 2020-03-22 DIAGNOSIS — R140 Abdominal distension (gaseous): Secondary | ICD-10-CM

## 2020-03-22 DIAGNOSIS — E1110 Type 2 diabetes mellitus with ketoacidosis without coma: Secondary | ICD-10-CM | POA: Diagnosis not present

## 2020-03-22 DIAGNOSIS — J9601 Acute respiratory failure with hypoxia: Secondary | ICD-10-CM | POA: Diagnosis not present

## 2020-03-22 DIAGNOSIS — K8000 Calculus of gallbladder with acute cholecystitis without obstruction: Secondary | ICD-10-CM

## 2020-03-22 LAB — BASIC METABOLIC PANEL
Anion gap: 21 — ABNORMAL HIGH (ref 5–15)
BUN: 31 mg/dL — ABNORMAL HIGH (ref 8–23)
CO2: 17 mmol/L — ABNORMAL LOW (ref 22–32)
Calcium: 9.6 mg/dL (ref 8.9–10.3)
Chloride: 94 mmol/L — ABNORMAL LOW (ref 98–111)
Creatinine, Ser: 2.6 mg/dL — ABNORMAL HIGH (ref 0.44–1.00)
GFR, Estimated: 19 mL/min — ABNORMAL LOW (ref >60)
Glucose, Bld: 123 mg/dL — ABNORMAL HIGH (ref 70–99)
Potassium: 3.8 mmol/L (ref 3.5–5.1)
Sodium: 132 mmol/L — ABNORMAL LOW (ref 135–145)

## 2020-03-22 LAB — CBC
HCT: 27.1 % — ABNORMAL LOW (ref 36.0–46.0)
Hemoglobin: 8.8 g/dL — ABNORMAL LOW (ref 12.0–15.0)
MCH: 25.7 pg — ABNORMAL LOW (ref 26.0–34.0)
MCHC: 32.5 g/dL (ref 30.0–36.0)
MCV: 79 fL — ABNORMAL LOW (ref 80.0–100.0)
Platelets: 178 10*3/uL (ref 150–400)
RBC: 3.43 MIL/uL — ABNORMAL LOW (ref 3.87–5.11)
RDW: 13.6 % (ref 11.5–15.5)
WBC: 9.5 10*3/uL (ref 4.0–10.5)
nRBC: 0 % (ref 0.0–0.2)

## 2020-03-22 LAB — GLUCOSE, CAPILLARY
Glucose-Capillary: 142 mg/dL — ABNORMAL HIGH (ref 70–99)
Glucose-Capillary: 147 mg/dL — ABNORMAL HIGH (ref 70–99)
Glucose-Capillary: 157 mg/dL — ABNORMAL HIGH (ref 70–99)
Glucose-Capillary: 166 mg/dL — ABNORMAL HIGH (ref 70–99)
Glucose-Capillary: 237 mg/dL — ABNORMAL HIGH (ref 70–99)
Glucose-Capillary: 244 mg/dL — ABNORMAL HIGH (ref 70–99)

## 2020-03-22 LAB — PHOSPHORUS: Phosphorus: 4.3 mg/dL (ref 2.5–4.6)

## 2020-03-22 LAB — MAGNESIUM: Magnesium: 2.4 mg/dL (ref 1.7–2.4)

## 2020-03-22 MED ORDER — SODIUM CHLORIDE 0.9 % IV SOLN
2.0000 g | INTRAVENOUS | Status: AC
  Administered 2020-03-22 – 2020-03-23 (×2): 2 g via INTRAVENOUS
  Filled 2020-03-22 (×2): qty 20

## 2020-03-22 MED ORDER — IPRATROPIUM-ALBUTEROL 0.5-2.5 (3) MG/3ML IN SOLN
3.0000 mL | Freq: Three times a day (TID) | RESPIRATORY_TRACT | Status: DC
  Administered 2020-03-23: 3 mL via RESPIRATORY_TRACT
  Filled 2020-03-22: qty 3

## 2020-03-22 MED ORDER — TECHNETIUM TC 99M MEBROFENIN IV KIT
5.0000 | PACK | Freq: Once | INTRAVENOUS | Status: AC | PRN
  Administered 2020-03-22: 5 via INTRAVENOUS

## 2020-03-22 MED ORDER — PIPERACILLIN-TAZOBACTAM IN DEX 2-0.25 GM/50ML IV SOLN
2.2500 g | Freq: Three times a day (TID) | INTRAVENOUS | Status: DC
  Administered 2020-03-22: 2.25 g via INTRAVENOUS
  Filled 2020-03-22 (×2): qty 50

## 2020-03-22 MED ORDER — WHITE PETROLATUM EX OINT
TOPICAL_OINTMENT | CUTANEOUS | Status: AC
  Administered 2020-03-22: 0.2
  Filled 2020-03-22: qty 28.35

## 2020-03-22 MED ORDER — FUROSEMIDE 10 MG/ML IJ SOLN
40.0000 mg | Freq: Two times a day (BID) | INTRAMUSCULAR | Status: DC
  Administered 2020-03-22 – 2020-03-23 (×2): 40 mg via INTRAVENOUS
  Filled 2020-03-22 (×2): qty 4

## 2020-03-22 NOTE — Progress Notes (Signed)
HIDA negative for acute cholecystitis, no indication for cholecystectomy. We will sign off, please call with concerns.  Wellington Hampshire, [facility] 03/22/2020, 2:32 PM Please see Amion for pager number during day hours 7:00am-4:30pm

## 2020-03-22 NOTE — Progress Notes (Signed)
Pt transported on Bipap with RT, RN, Radio producer and transport to [facility] for a HIDA scan. Pt remained on bipap during the scan and transport. VSS throughout. RT will continue to monitor.

## 2020-03-22 NOTE — Progress Notes (Signed)
[facility] Progress Note     Subjective: CC-  On Bipap this AM. No abdominal pain. Going for HIDA scan today.  Objective: Vital signs in last 24 hours: Temp:  [98.4 F (36.9 C)-100.1 F (37.8 C)] 98.4 F (36.9 C) (11/08 0737) Pulse Rate:  [91-105] 104 (11/08 0800) Resp:  [16-34] 26 (11/08 0800) BP: (117-159)/(53-92) 149/92 (11/08 0800) SpO2:  [64 %-100 %] 96 % (11/08 0800) FiO2 (%):  [40 %] 40 % (11/08 0739) Last BM Date:  (pta)  Intake/Output from previous day: 11/07 0701 - 11/08 0700 In: 1201.6 [I.V.:289.1; IV Piggyback:412.5] Out: 500 [Urine:500] Intake/Output this shift: Total I/O In: 37.5 [I.V.:25; IV Piggyback:12.5] Out: -   PE: Gen:  Drowsy, NAD Card:  RRR Pulm:  On Bipap Abd: mild distension, soft, +BS, nontender abdomen, no HSM Skin: no rashes noted, warm and dry  Lab Results:  Recent Labs    03/21/20 0242 03/22/20 0130  WBC 8.7 9.5  HGB 8.0* 8.8*  HCT 23.1* 27.1*  PLT 166 178   BMET Recent Labs    03/20/20 1437 03/21/20 1033  NA 126* 129*  K 3.2* 3.3*  CL 88* 90*  CO2 21* 22  GLUCOSE 163* 144*  BUN 25* 29*  CREATININE 1.63* 2.35*  CALCIUM 9.8 9.8   PT/INR No results for input(s): LABPROT, INR in the last 72 hours. CMP     Component Value Date/Time   NA 129 (L) 03/21/2020 1033   K 3.3 (L) 03/21/2020 1033   CL 90 (L) 03/21/2020 1033   CO2 22 03/21/2020 1033   GLUCOSE 144 (H) 03/21/2020 1033   BUN 29 (H) 03/21/2020 1033   CREATININE 2.35 (H) 03/21/2020 1033   CALCIUM 9.8 03/21/2020 1033   PROT 7.8 03/20/2020 1816   ALBUMIN 4.9 03/20/2020 1816   AST 117 (H) 03/20/2020 1816   ALT 151 (H) 03/20/2020 1816   ALKPHOS 75 03/20/2020 1816   BILITOT 1.3 (H) 03/20/2020 1816   GFRNONAA 21 (L) 03/21/2020 1033   GFRAA 67 (L) 10/12/2011 0710   Lipase     Component Value Date/Time   LIPASE 94 (H) 03/19/2020 2046       Studies/Results: DG Abd 1 View  Result Date: 03/20/2020 CLINICAL DATA:  Abdominal distension. EXAM:  ABDOMEN - 1 VIEW COMPARISON:  March 19, 2020 FINDINGS: There is mild gaseous distention of the small bowel and colon. There is a moderate amount of stool throughout the colon. There is no definite pneumatosis or free air. No radiopaque kidney stones. IMPRESSION: Mild gaseous distention of the small bowel and colon. Moderate amount of stool throughout the colon. Electronically Signed   By: Constance Holster M.D.   On: 03/20/2020 19:55   ECHOCARDIOGRAM LIMITED  Result Date: 03/20/2020    ECHOCARDIOGRAM LIMITED REPORT   Patient Name:   NICOLETTE GIESKE Date of Exam: 03/20/2020 Medical Rec #:  [id number]         Height:       60.0 in Accession #:    [id number]        Weight:       118.2 lb Date of Birth:  20-May-1945         BSA:          1.493 m Patient Age:    39 years          BP:           142/67 mmHg Patient Gender: F  HR:           100 bpm. Exam Location:  Inpatient Procedure: 2D Echo, Cardiac Doppler, Color Doppler and Intracardiac            Opacification Agent Indications:    Cardiomyopathy-Unspecified 425.9 / I42.9  History:        Patient has prior history of Echocardiogram examinations, most                 recent 02/23/2020. Signs/Symptoms:Chest Pain; Risk                 Factors:Hypertension, Diabetes, Dyslipidemia and Non-Smoker.                 GERD.  Sonographer:    Vickie Epley RDCS Referring Phys: [id number] [place]  1. Left ventricular ejection fraction, by estimation, is 60 to 65%. The left ventricle has normal function. The left ventricle has no regional wall motion abnormalities. Left ventricular diastolic parameters are consistent with Grade II diastolic dysfunction (pseudonormalization).  2. Right ventricular systolic function is normal. The right ventricular size is normal. There is mildly elevated pulmonary artery systolic pressure. The estimated right ventricular systolic pressure is 22.0 mmHg.  3. Left atrial size was mildly dilated.  4. The mitral valve is  grossly normal. Trivial mitral valve regurgitation.  5. The aortic valve is tricuspid. There is moderate calcification of the aortic valve. Aortic valve regurgitation is not visualized. Mild to moderate aortic valve sclerosis/calcification is present, without any evidence of aortic stenosis.  6. The inferior vena cava is normal in size with greater than 50% respiratory variability, suggesting right atrial pressure of 3 mmHg. FINDINGS  Left Ventricle: Left ventricular ejection fraction, by estimation, is 60 to 65%. The left ventricle has normal function. The left ventricle has no regional wall motion abnormalities. Definity contrast agent was given IV to delineate the left ventricular  endocardial borders. The left ventricular internal cavity size was normal in size. There is no left ventricular hypertrophy. Left ventricular diastolic parameters are consistent with Grade II diastolic dysfunction (pseudonormalization). Right Ventricle: The right ventricular size is normal. No increase in right ventricular wall thickness. Right ventricular systolic function is normal. There is mildly elevated pulmonary artery systolic pressure. The tricuspid regurgitant velocity is 2.91  m/s, and with an assumed right atrial pressure of 3 mmHg, the estimated right ventricular systolic pressure is 25.4 mmHg. Left Atrium: Left atrial size was mildly dilated. Right Atrium: Right atrial size was normal in size. Mitral Valve: The mitral valve is grossly normal. Mild mitral annular calcification. Trivial mitral valve regurgitation. Aortic Valve: The aortic valve is tricuspid. There is moderate calcification of the aortic valve. There is mild aortic valve annular calcification. Aortic valve regurgitation is not visualized. Mild to moderate aortic valve sclerosis/calcification is present, without any evidence of aortic stenosis. Pulmonic Valve: The pulmonic valve was grossly normal. Pulmonic valve regurgitation is trivial. Aorta: The aortic  root is normal in size and structure. Venous: The inferior vena cava is normal in size with greater than 50% respiratory variability, suggesting right atrial pressure of 3 mmHg. IAS/Shunts: No atrial level shunt detected by color flow Doppler. LEFT VENTRICLE PLAX 2D LVIDd:         4.50 cm     Diastology LVIDs:         3.00 cm     LV e' medial:    3.83 cm/s LV PW:         0.80 cm  LV E/e' medial:  24.5 LV IVS:        0.80 cm     LV e' lateral:   6.90 cm/s LVOT diam:     2.00 cm     LV E/e' lateral: 13.6 LV SV:         68 LV SV Index:   46 LVOT Area:     3.14 cm  LV Volumes (MOD) LV vol d, MOD A2C: 68.2 ml LV vol d, MOD A4C: 59.3 ml LV vol s, MOD A2C: 19.2 ml LV vol s, MOD A4C: 20.2 ml LV SV MOD A2C:     49.0 ml LV SV MOD A4C:     59.3 ml LV SV MOD BP:      45.6 ml RIGHT VENTRICLE RV S prime:     7.35 cm/s TAPSE (M-mode): 1.6 cm LEFT ATRIUM         Index LA diam:    4.10 cm 2.75 cm/m  AORTIC VALVE LVOT Vmax:   120.00 cm/s LVOT Vmean:  87.600 cm/s LVOT VTI:    0.217 m  AORTA Ao Root diam: 3.10 cm MITRAL VALVE               TRICUSPID VALVE MV Area (PHT): 6.17 cm    TR Peak grad:   33.9 mmHg MV Decel Time: 123 msec    TR Vmax:        291.00 cm/s MV E velocity: 93.80 cm/s MV A velocity: 92.50 cm/s  SHUNTS MV E/A ratio:  1.01        Systemic VTI:  0.22 m                            Systemic Diam: 2.00 cm Rozann Lesches MD Electronically signed by Rozann Lesches MD Signature Date/Time: 03/20/2020/1:10:44 PM    Final     Anti-infectives: Anti-infectives (From admission, onward)   Start     Dose/Rate Route Frequency Ordered Stop   03/20/20 0000  piperacillin-tazobactam (ZOSYN) IVPB 3.375 g        3.375 g 12.5 mL/hr over 240 Minutes Intravenous Every 8 hours 03/19/20 2347     03/19/20 2315  piperacillin-tazobactam (ZOSYN) IVPB 3.375 g  Status:  Discontinued        3.375 g 100 mL/hr over 30 Minutes Intravenous  Once 03/19/20 2301 03/19/20 2309   03/19/20 2315  vancomycin (VANCOCIN) IVPB 1000 mg/200 mL premix   Status:  Discontinued        1,000 mg 200 mL/hr over 60 Minutes Intravenous  Once 03/19/20 2301 03/19/20 2310   03/19/20 2315  cefTRIAXone (ROCEPHIN) 1 g in sodium chloride 0.9 % 100 mL IVPB  Status:  Discontinued        1 g 200 mL/hr over 30 Minutes Intravenous  Once 03/19/20 2310 03/19/20 2347   03/19/20 2315  metroNIDAZOLE (FLAGYL) IVPB 500 mg  Status:  Discontinued        500 mg 100 mL/hr over 60 Minutes Intravenous  Once 03/19/20 2310 03/19/20 2347   03/19/20 2245  cefTRIAXone (ROCEPHIN) 1 g in sodium chloride 0.9 % 100 mL IVPB  Status:  Discontinued        1 g 200 mL/hr over 30 Minutes Intravenous  Once 03/19/20 2241 03/19/20 2301   03/19/20 2245  metroNIDAZOLE (FLAGYL) IVPB 500 mg  Status:  Discontinued        500 mg 100 mL/hr over 60 Minutes Intravenous  Once 03/19/20  2241 03/19/20 2301   03/19/20 1930  cefTRIAXone (ROCEPHIN) 1 g in sodium chloride 0.9 % 100 mL IVPB        1 g 200 mL/hr over 30 Minutes Intravenous  Once 03/19/20 1915 03/19/20 2213   03/19/20 1930  azithromycin (ZITHROMAX) 500 mg in sodium chloride 0.9 % 250 mL IVPB        500 mg 250 mL/hr over 60 Minutes Intravenous  Once 03/19/20 1915 03/19/20 2316       Assessment/Plan Respiratory failure, pulmonary edema - on Bipap DM HTN GERD AKI  ?Acute cholecystitis - HIDA pending.  ID - currently zosyn 11/5>> FEN - IVF, NPO VTE - lovenox Foley - in place Follow up - TBD    LOS: 3 days    Wellington Hampshire, [facility] 03/22/2020, 8:46 AM Please see Amion for pager number during day hours 7:00am-4:30pm

## 2020-03-22 NOTE — Progress Notes (Signed)
PHARMACY NOTE:  ANTIMICROBIAL RENAL DOSAGE ADJUSTMENT  Current antimicrobial regimen includes a mismatch between antimicrobial dosage and estimated renal function.  As per policy approved by the Pharmacy & Therapeutics and Medical Executive Committees, the antimicrobial dosage will be adjusted accordingly.  Current antimicrobial dosage:  Zosyn 3.375g IV every 8 hours - 4 hour infusion  Indication: Intra-abdominal infection  Renal Function:   Estimated Creatinine Clearance: 14.9 mL/min (A) (by C-G formula based on SCr of 2.35 mg/dL (H)). []      On intermittent HD, scheduled: []      On CRRT    Antimicrobial dosage has been changed to:  Zosyn 2.25g IV every 8 hours - 30 minute infusion.   Additional comments:   Thank you for allowing pharmacy to be a part of this patient's care.  Sloan Leiter, PharmD, BCPS, BCCCP Clinical Pharmacist Please refer to [facility] for [place] numbers 03/22/2020 8:59 AM

## 2020-03-22 NOTE — Progress Notes (Signed)
NAME:  Amanda [name], MRN:  [id number], DOB:  05/24/1945, LOS: 3 ADMISSION DATE:  03/19/2020, CONSULTATION DATE:  03/19/20 REFERRING MD:  Tegeler, CHIEF COMPLAINT:  SOB   Brief History   74 year old woman presenting with acute hypoxemic respiratory failure and abdominal pain found to have signs of both fluid overload and acute cholecystitis.  History of present illness   74 year old non english speaking woman ([place], unable to find interpreter for this) presenting with belly pain and SOB.  Workup in ER reveals acute on chronic hyponatremia, acute liver injury, acute kidney injury, lactic acidosis, and significant hyperglycemia.  [place] RUQ c/w cholecystitis.  Gen Surg called and recommends CT A/P for now, this is pending.  Patient had progressive dyspnea with fluid resuscitation so placed and BIPAP and PCCM consulted.  Past Medical History  HTN DM GERD  Significant Hospital Events   03/19/20 admitted  Consults:  General Surgery  Procedures:  N/A  Significant Diagnostic Tests:  CT A/P >>Small gallstone seen within the region of the gallbladder neck. Pericholecystic fluid seen around the gallbladder. In combination with findings on earlier ultrasound, findings are suggestive of acute cholecystitis. CXR pulmonary edema RUQ [place] cholecystitis changes  Limited ECHO 11/6: EF 60-65%, grade II diastolic dysfunction. RV systolic function normal. Elevated RVSP 30mmHg. Left atria mildly dilated.  Micro Data:  COVID>>negative Bld Cx>> MRSA negative  Antimicrobials:  Zosyn 11/5- Azithromycin 11/5 only Ceftriaxone 11/5 only   Interim history/subjective:  Seen on Bipap this AM, going for HIDA, trial nasal cannula after.  Lasix stopped 11/6   Objective   Blood pressure (!) 149/92, pulse (!) 104, temperature 98.4 F (36.9 C), temperature source Axillary, resp. rate (!) 26, height 5' (1.524 m), weight 53.6 kg, SpO2 96 %.    FiO2 (%):  [40 %-60 %] 40 %   Intake/Output Summary (Last 24  hours) at 03/22/2020 0836 Last data filed at 03/22/2020 0700 Gross per 24 hour  Intake 1201.6 ml  Output 500 ml  Net 701.6 ml   Filed Weights   03/19/20 1900  Weight: 53.6 kg    Examination: Constitutional: elderly woman, seen on Bipap in no acute distress Eyes: eyes are anicteric, reactive to light Ears, nose, mouth, and throat: BIPAP in place with good seal Cardiovascular: RR, no appreciated murmers, rubs, gallops Respiratory: decreased air entry bilateral bases, no wheezing Gastrointestinal: abdomen is soft, no obvious TTP Skin: No rashes, normal turgor Neurologic: opens eyes, language barrier   Resolved Hospital Problem list     Assessment & Plan:     Acute hypoxemic respiratory failure due to pulmonary edema- trop neg, echo last month benign P: -CXR with worsening effusions today, resume Lasix and consider thoracentesis - Strict I/O - BIPAP  - Repeat limited echo with grade II diastolic failure and EF 57-26% - scheduled duonebs for wheezing. Unsure if underlying lung disease.  Radiographic findings of Acute cholecystitis Small gallstone seen within the region of the gallbladder neck,  No obvious pain on exam P - HIDA today - Continue Zosyn pending surgery recs  Acute on chronic non-oliguric kidney injury In setting of respiratory failure and acute heart failure, possible hypovolemia in setting of aggressive diuresis. Cr worsened 11/7. P: -despite Lasix holiday creatinine and AGMA worsening today, with increasing pleural effusions will resume -Check FEUrea -follow UOP, may need nephrology consult  Hypokalemia: -replace K  Lactic acidosis  In setting of heart failure and respiratory failure  - trended down with diuresis and supplemental oxygen  Acute on chronic hyponatremia likely acute phase reactant plus pseudohyponatremia related to hyperglycemia (corrected 123) - Resolving  DM with hyperglycemia, beta hydroxybutyrate neg P: -Stop D5, hold SSI while  NPO   *Re: Language barrier-I spoke with [employer] who said they have a [place] interpreter, however son says she speaks a specific dialect within the [place] language and did not think this would be helpful.  He was at the bedside this morning and says she is speaking with him and understands her circumstances  Best practice:  Diet: NPO Pain/Anxiety/Delirium protocol (if indicated): N/A VAP protocol (if indicated): N/A DVT prophylaxis: SCDs pending surgical eval GI prophylaxis: PPI Glucose control: SQ insulin prn Mobility: BR Code Status: full Family Communication: Son updated   Labs   CBC: Recent Labs  Lab 03/19/20 1724 03/19/20 1759 03/20/20 0340 03/21/20 0242 03/22/20 0130  WBC  --  7.4 6.0 8.7 9.5  NEUTROABS  --  6.0  --   --   --   HGB 10.5* 9.6* 8.0* 8.0* 8.8*  HCT 31.0* 29.5* 23.7* 23.1* 27.1*  MCV  --  79.3* 75.7* 75.5* 79.0*  PLT  --  199 157 166 161    Basic Metabolic Panel: Recent Labs  Lab 03/19/20 2046 03/20/20 0007 03/20/20 0340 03/20/20 1437 03/21/20 0242 03/21/20 1033 03/22/20 0130  NA 116* 117* 120* 126*  --  129*  --   K 4.1 4.5 4.5 3.2*  --  3.3*  --   CL 82* 85* 85* 88*  --  90*  --   CO2 13* 16* 19* 21*  --  22  --   GLUCOSE 400* 379* 318* 163*  --  144*  --   BUN 27* 27* 25* 25*  --  29*  --   CREATININE 1.66* 1.60* 1.64* 1.63*  --  2.35*  --   CALCIUM 9.3 8.4* 9.0 9.8  --  9.8  --   MG  --   --  1.7  --  2.6*  --  2.4  PHOS  --   --  3.7  --  4.2  --  4.3   GFR: Estimated Creatinine Clearance: 14.9 mL/min (A) (by C-G formula based on SCr of 2.35 mg/dL (H)). Recent Labs  Lab 03/19/20 1759 03/19/20 2046 03/20/20 0007 03/20/20 0340 03/20/20 1437 03/21/20 0242 03/22/20 0130  PROCALCITON  --   --  0.18  --   --   --   --   WBC 7.4  --   --  6.0  --  8.7 9.5  LATICACIDVEN  --  8.8* 6.7* 4.2* 2.5*  --   --     Liver Function Tests: Recent Labs  Lab 03/19/20 1759 03/20/20 1816  AST 262* 117*  ALT 247* 151*  ALKPHOS  99 75  BILITOT 1.1 1.3*  PROT 7.5 7.8  ALBUMIN 4.2 4.9   Recent Labs  Lab 03/19/20 2046  LIPASE 94*   Recent Labs  Lab 03/19/20 2046  AMMONIA 31    ABG    Component Value Date/Time   PHART 7.307 (L) 03/19/2020 1724   PCO2ART 34.4 03/19/2020 1724   PO2ART 40 (LL) 03/19/2020 1724   HCO3 17.2 (L) 03/19/2020 1724   TCO2 18 (L) 03/19/2020 1724   ACIDBASEDEF 8.0 (H) 03/19/2020 1724   O2SAT 70.0 03/19/2020 1724     Coagulation Profile: No results for input(s): INR, PROTIME in the last 168 hours.  Cardiac Enzymes: No results for input(s): CKTOTAL, CKMB, CKMBINDEX, TROPONINI in the  last 168 hours.   Critical care time:     CRITICAL CARE Performed by: Otilio Carpen Zarayah Lanting   Total critical care time: 35 minutes  Critical care time was exclusive of separately billable procedures and treating other patients.  Critical care was necessary to treat or prevent imminent or life-threatening deterioration.  Critical care was time spent personally by me on the following activities: development of treatment plan with patient and/or surrogate as well as nursing, discussions with consultants, evaluation of patient's response to treatment, examination of patient, obtaining history from patient or surrogate, ordering and performing treatments and interventions, ordering and review of laboratory studies, ordering and review of radiographic studies, pulse oximetry and re-evaluation of patient's condition.  Otilio Carpen Miyah Hampshire, PA-C  PCCM  Pager# [phone], if no answer [phone]

## 2020-03-23 DIAGNOSIS — E1110 Type 2 diabetes mellitus with ketoacidosis without coma: Principal | ICD-10-CM

## 2020-03-23 DIAGNOSIS — R4182 Altered mental status, unspecified: Secondary | ICD-10-CM | POA: Diagnosis not present

## 2020-03-23 LAB — BASIC METABOLIC PANEL
Anion gap: 14 (ref 5–15)
BUN: 26 mg/dL — ABNORMAL HIGH (ref 8–23)
CO2: 24 mmol/L (ref 22–32)
Calcium: 9.8 mg/dL (ref 8.9–10.3)
Chloride: 98 mmol/L (ref 98–111)
Creatinine, Ser: 2.09 mg/dL — ABNORMAL HIGH (ref 0.44–1.00)
GFR, Estimated: 24 mL/min — ABNORMAL LOW (ref >60)
Glucose, Bld: 229 mg/dL — ABNORMAL HIGH (ref 70–99)
Potassium: 3.3 mmol/L — ABNORMAL LOW (ref 3.5–5.1)
Sodium: 136 mmol/L (ref 135–145)

## 2020-03-23 LAB — UREA NITROGEN, URINE: Urea Nitrogen, Ur: 450 mg/dL

## 2020-03-23 LAB — MAGNESIUM: Magnesium: 2.3 mg/dL (ref 1.7–2.4)

## 2020-03-23 LAB — CBC
HCT: 27.4 % — ABNORMAL LOW (ref 36.0–46.0)
Hemoglobin: 9 g/dL — ABNORMAL LOW (ref 12.0–15.0)
MCH: 25.9 pg — ABNORMAL LOW (ref 26.0–34.0)
MCHC: 32.8 g/dL (ref 30.0–36.0)
MCV: 78.7 fL — ABNORMAL LOW (ref 80.0–100.0)
Platelets: 195 10*3/uL (ref 150–400)
RBC: 3.48 MIL/uL — ABNORMAL LOW (ref 3.87–5.11)
RDW: 13.7 % (ref 11.5–15.5)
WBC: 5.5 10*3/uL (ref 4.0–10.5)
nRBC: 0 % (ref 0.0–0.2)

## 2020-03-23 LAB — GLUCOSE, CAPILLARY
Glucose-Capillary: 182 mg/dL — ABNORMAL HIGH (ref 70–99)
Glucose-Capillary: 218 mg/dL — ABNORMAL HIGH (ref 70–99)
Glucose-Capillary: 219 mg/dL — ABNORMAL HIGH (ref 70–99)
Glucose-Capillary: 272 mg/dL — ABNORMAL HIGH (ref 70–99)
Glucose-Capillary: 381 mg/dL — ABNORMAL HIGH (ref 70–99)
Glucose-Capillary: 44 mg/dL — CL (ref 70–99)

## 2020-03-23 LAB — PHOSPHORUS: Phosphorus: 3.9 mg/dL (ref 2.5–4.6)

## 2020-03-23 MED ORDER — POTASSIUM CHLORIDE CRYS ER 20 MEQ PO TBCR
40.0000 meq | EXTENDED_RELEASE_TABLET | Freq: Once | ORAL | Status: AC
  Administered 2020-03-23: 40 meq via ORAL
  Filled 2020-03-23: qty 2

## 2020-03-23 MED ORDER — ASPIRIN EC 81 MG PO TBEC
81.0000 mg | DELAYED_RELEASE_TABLET | Freq: Every day | ORAL | Status: DC
  Administered 2020-03-23 – 2020-03-26 (×4): 81 mg via ORAL
  Filled 2020-03-23 (×4): qty 1

## 2020-03-23 MED ORDER — PRAVASTATIN SODIUM 40 MG PO TABS
80.0000 mg | ORAL_TABLET | Freq: Every day | ORAL | Status: DC
  Administered 2020-03-23 – 2020-03-26 (×4): 80 mg via ORAL
  Filled 2020-03-23 (×5): qty 2

## 2020-03-23 MED ORDER — FUROSEMIDE 10 MG/ML IJ SOLN
40.0000 mg | Freq: Every day | INTRAMUSCULAR | Status: DC
  Administered 2020-03-24: 40 mg via INTRAVENOUS
  Filled 2020-03-23: qty 4

## 2020-03-23 MED ORDER — METOPROLOL TARTRATE 25 MG PO TABS
25.0000 mg | ORAL_TABLET | Freq: Two times a day (BID) | ORAL | Status: DC
  Administered 2020-03-23 – 2020-03-26 (×7): 25 mg via ORAL
  Filled 2020-03-23 (×3): qty 1
  Filled 2020-03-23 (×2): qty 2
  Filled 2020-03-23: qty 1
  Filled 2020-03-23: qty 2

## 2020-03-23 MED ORDER — PANTOPRAZOLE SODIUM 40 MG PO TBEC
40.0000 mg | DELAYED_RELEASE_TABLET | Freq: Every day | ORAL | Status: DC
  Administered 2020-03-24 – 2020-03-26 (×3): 40 mg via ORAL
  Filled 2020-03-23 (×3): qty 1

## 2020-03-23 MED ORDER — INSULIN ASPART 100 UNIT/ML ~~LOC~~ SOLN
0.0000 [IU] | SUBCUTANEOUS | Status: DC
  Administered 2020-03-23: 5 [IU] via SUBCUTANEOUS
  Administered 2020-03-23: 3 [IU] via SUBCUTANEOUS
  Administered 2020-03-23: 9 [IU] via SUBCUTANEOUS
  Administered 2020-03-23: 2 [IU] via SUBCUTANEOUS
  Administered 2020-03-24: 9 [IU] via SUBCUTANEOUS

## 2020-03-23 MED ORDER — INSULIN DETEMIR 100 UNIT/ML ~~LOC~~ SOLN
5.0000 [IU] | Freq: Every day | SUBCUTANEOUS | Status: DC
  Administered 2020-03-23 – 2020-03-25 (×3): 5 [IU] via SUBCUTANEOUS
  Filled 2020-03-23 (×3): qty 0.05

## 2020-03-23 NOTE — Progress Notes (Signed)
NAME:  Amanda [name], MRN:  [id number], DOB:  Nov 23, 1945, LOS: 4 ADMISSION DATE:  03/19/2020, CONSULTATION DATE:  03/19/20 REFERRING MD:  Tegeler, CHIEF COMPLAINT:  SOB   Brief History   74 year old woman presenting with acute hypoxemic respiratory failure and abdominal pain found to have signs of both fluid overload and acute cholecystitis.  History of present illness   74 year old non english speaking woman ([place], unable to find interpreter for this) presenting with belly pain and SOB.  Workup in ER reveals acute on chronic hyponatremia, acute liver injury, acute kidney injury, lactic acidosis, and significant hyperglycemia.  [place] RUQ c/w cholecystitis.  Gen Surg called and recommends CT A/P for now, this is pending.  Patient had progressive dyspnea with fluid resuscitation so placed and BIPAP and PCCM consulted.  Past Medical History  HTN DM GERD  Significant Hospital Events   03/19/20 admitted  Consults:  General Surgery  Procedures:  N/A  Significant Diagnostic Tests:  CT A/P >>Small gallstone seen within the region of the gallbladder neck. Pericholecystic fluid seen around the gallbladder. In combination with findings on earlier ultrasound, findings are suggestive of acute cholecystitis. CXR pulmonary edema RUQ [place] cholecystitis changes  Limited ECHO 11/6: EF 60-65%, grade II diastolic dysfunction. RV systolic function normal. Elevated RVSP 16mmHg. Left atria mildly dilated.  Micro Data:  COVID>>negative Bld Cx>> MRSA negative  Antimicrobials:  Zosyn 11/5-11/8 Azithromycin 11/5 only Ceftriaxone 11/5-11/10   Interim history/subjective:  Pt transitioned to nasal cannula yesterday and tolerated well overnight.  On 5L, awake and talkative today. Diuresed 3L yesterday   Objective   Blood pressure (!) 143/74, pulse (!) 101, temperature (!) 100.4 F (38 C), temperature source Oral, resp. rate (!) 29, height 5' (1.524 m), weight 53.6 kg, SpO2 92 %.    FiO2 (%):  [40  %] 40 %   Intake/Output Summary (Last 24 hours) at 03/23/2020 0753 Last data filed at 03/23/2020 0600 Gross per 24 hour  Intake 495.97 ml  Output 2935 ml  Net -2439.03 ml   Filed Weights   03/19/20 1900  Weight: 53.6 kg    Examination: Constitutional: elderly woman, awake and conversing with son on nasal cannula and in no distress Eyes: eyes are anicteric, reactive to light Ears, nose, mouth, and throat: moist, pupils equal Cardiovascular: RR, no appreciated murmers, rubs, gallops Respiratory: mildly decreased bilateral bases with good air entry on nasal cannula and no distress Gastrointestinal: abdomen is soft, no obvious TTP Skin: No rashes, normal turgor Neurologic: opens eyes, language barrier   Resolved Hospital Problem list   Lactic acidosis [place]ma Hyponatremia  Assessment & Plan:   Acute hypoxemic respiratory failure due to pulmonary edema- Improved. trop neg, echo progressed to diastolic HF when compared to last admission Diuresed ~3L yesterday and transitioned to nasal cannula P: -CXR with worsening effusions 11/8, diuresed and clinically improved todau - Strict I/O - BIPAP  - Repeat limited echo with grade II diastolic failure and EF 36-64% -prn duonebs -Pt is hemodynamically stable for transfer out of the ICU today -finish course of ceftriaxone for possible CAP on 11/9 -resume home Asa, metoprolol and continue Lasix 40mg  qd  Radiographic findings of Acute cholecystitis Small gallstone seen within the region of the gallbladder neck,  No obvious pain on exam P - HIDA negative for acute cholecystitis - Surgery signed off and Zosyn d/c'd  Acute on chronic non-oliguric kidney injury In setting of respiratory failure and acute heart failure, possible hypovolemia in setting of aggressive  diuresis. Cr worsened 11/7. P: -Improved with diuresis, continue to follow UOP and renal indices and avoid nephrotoxins as able  Hypokalemia: -replace K    DM with  hyperglycemia, beta hydroxybutyrate neg P: -add diet, SSI and [place]  *Re: Language barrier-I spoke with [employer] who said they have a [place] interpreter, however son says she speaks a specific dialect within the [place] language and did not think this would be helpful.  He was at the bedside this morning and says she is speaking with him and understands her circumstances  Best practice:  Diet: diabetic Pain/Anxiety/Delirium protocol (if indicated): N/A VAP protocol (if indicated): N/A DVT prophylaxis: SCDs pending surgical eval GI prophylaxis: PPI Glucose control: SQ insulin prn Mobility: BR Code Status: full Family Communication: Son updated at the bedside Dispo: To [facility]   Labs   CBC: Recent Labs  Lab 03/19/20 1759 03/20/20 0340 03/21/20 0242 03/22/20 0130 03/23/20 0131  WBC 7.4 6.0 8.7 9.5 5.5  NEUTROABS 6.0  --   --   --   --   HGB 9.6* 8.0* 8.0* 8.8* 9.0*  HCT 29.5* 23.7* 23.1* 27.1* 27.4*  MCV 79.3* 75.7* 75.5* 79.0* 78.7*  PLT 199 157 166 178 073    Basic Metabolic Panel: Recent Labs  Lab 03/20/20 0340 03/20/20 1437 03/21/20 0242 03/21/20 1033 03/22/20 0130 03/23/20 0131  NA 120* 126*  --  129* 132* 136  K 4.5 3.2*  --  3.3* 3.8 3.3*  CL 85* 88*  --  90* 94* 98  CO2 19* 21*  --  22 17* 24  GLUCOSE 318* 163*  --  144* 123* 229*  BUN 25* 25*  --  29* 31* 26*  CREATININE 1.64* 1.63*  --  2.35* 2.60* 2.09*  CALCIUM 9.0 9.8  --  9.8 9.6 9.8  MG 1.7  --  2.6*  --  2.4 2.3  PHOS 3.7  --  4.2  --  4.3 3.9   GFR: Estimated Creatinine Clearance: 16.7 mL/min (A) (by C-G formula based on SCr of 2.09 mg/dL (H)). Recent Labs  Lab 03/19/20 1759 03/19/20 2046 03/20/20 0007 03/20/20 0340 03/20/20 1437 03/21/20 0242 03/22/20 0130 03/23/20 0131  PROCALCITON  --   --  0.18  --   --   --   --   --   WBC   < >  --   --  6.0  --  8.7 9.5 5.5  LATICACIDVEN  --  8.8* 6.7* 4.2* 2.5*  --   --   --    < > = values in this interval not displayed.     Liver Function Tests: Recent Labs  Lab 03/19/20 1759 03/20/20 1816  AST 262* 117*  ALT 247* 151*  ALKPHOS 99 75  BILITOT 1.1 1.3*  PROT 7.5 7.8  ALBUMIN 4.2 4.9   Recent Labs  Lab 03/19/20 2046  LIPASE 94*   Recent Labs  Lab 03/19/20 2046  AMMONIA 31    ABG    Component Value Date/Time   PHART 7.307 (L) 03/19/2020 1724   PCO2ART 34.4 03/19/2020 1724   PO2ART 40 (LL) 03/19/2020 1724   HCO3 17.2 (L) 03/19/2020 1724   TCO2 18 (L) 03/19/2020 1724   ACIDBASEDEF 8.0 (H) 03/19/2020 1724   O2SAT 70.0 03/19/2020 1724     Coagulation Profile: No results for input(s): INR, PROTIME in the last 168 hours.  Cardiac Enzymes: No results for input(s): CKTOTAL, CKMB, CKMBINDEX, TROPONINI in the last 168 hours.   Critical  care time: 40 minutes    CRITICAL CARE Performed by: Otilio Carpen Ravenna Legore   Total critical care time: 40 minutes  Critical care time was exclusive of separately billable procedures and treating other patients.  Critical care was necessary to treat or prevent imminent or life-threatening deterioration.  Critical care was time spent personally by me on the following activities: development of treatment plan with patient and/or surrogate as well as nursing, discussions with consultants, evaluation of patient's response to treatment, examination of patient, obtaining history from patient or surrogate, ordering and performing treatments and interventions, ordering and review of laboratory studies, ordering and review of radiographic studies, pulse oximetry and re-evaluation of patient's condition.  Otilio Carpen Eyla Tallon, PA-C  PCCM  Pager# [phone], if no answer [phone]

## 2020-03-23 NOTE — Progress Notes (Signed)
[place] Progress Note Patient Name: Amanda [name] DOB: 05/28/1945 MRN: [id number]   Date of Service  03/23/2020  HPI/Events of Note  K 3.3 Creatinine 2.09  eICU Interventions  Ordered K 40 meqs PO x 1     Intervention Category Minor Interventions: Electrolytes abnormality - evaluation and management  Judd Lien 03/23/2020, 3:34 AM

## 2020-03-24 ENCOUNTER — Inpatient Hospital Stay (HOSPITAL_COMMUNITY): Payer: Medicare Other

## 2020-03-24 DIAGNOSIS — E1110 Type 2 diabetes mellitus with ketoacidosis without coma: Secondary | ICD-10-CM | POA: Diagnosis not present

## 2020-03-24 DIAGNOSIS — I700 Atherosclerosis of aorta: Secondary | ICD-10-CM

## 2020-03-24 DIAGNOSIS — N179 Acute kidney failure, unspecified: Secondary | ICD-10-CM

## 2020-03-24 DIAGNOSIS — Z789 Other specified health status: Secondary | ICD-10-CM

## 2020-03-24 DIAGNOSIS — D509 Iron deficiency anemia, unspecified: Secondary | ICD-10-CM

## 2020-03-24 DIAGNOSIS — R4182 Altered mental status, unspecified: Secondary | ICD-10-CM | POA: Diagnosis not present

## 2020-03-24 DIAGNOSIS — J9601 Acute respiratory failure with hypoxia: Secondary | ICD-10-CM | POA: Diagnosis not present

## 2020-03-24 DIAGNOSIS — J90 Pleural effusion, not elsewhere classified: Secondary | ICD-10-CM

## 2020-03-24 LAB — CULTURE, BLOOD (ROUTINE X 2)
Culture: NO GROWTH
Culture: NO GROWTH
Special Requests: ADEQUATE
Special Requests: ADEQUATE

## 2020-03-24 LAB — COMPREHENSIVE METABOLIC PANEL
ALT: 82 U/L — ABNORMAL HIGH (ref 0–44)
AST: 30 U/L (ref 15–41)
Albumin: 3.9 g/dL (ref 3.5–5.0)
Alkaline Phosphatase: 67 U/L (ref 38–126)
Anion gap: 14 (ref 5–15)
BUN: 22 mg/dL (ref 8–23)
CO2: 24 mmol/L (ref 22–32)
Calcium: 9.9 mg/dL (ref 8.9–10.3)
Chloride: 99 mmol/L (ref 98–111)
Creatinine, Ser: 1.48 mg/dL — ABNORMAL HIGH (ref 0.44–1.00)
GFR, Estimated: 37 mL/min — ABNORMAL LOW (ref >60)
Glucose, Bld: 154 mg/dL — ABNORMAL HIGH (ref 70–99)
Potassium: 3.4 mmol/L — ABNORMAL LOW (ref 3.5–5.1)
Sodium: 137 mmol/L (ref 135–145)
Total Bilirubin: 0.9 mg/dL (ref 0.3–1.2)
Total Protein: 7.8 g/dL (ref 6.5–8.1)

## 2020-03-24 LAB — CBC
HCT: 29.8 % — ABNORMAL LOW (ref 36.0–46.0)
Hemoglobin: 9.7 g/dL — ABNORMAL LOW (ref 12.0–15.0)
MCH: 25.9 pg — ABNORMAL LOW (ref 26.0–34.0)
MCHC: 32.6 g/dL (ref 30.0–36.0)
MCV: 79.5 fL — ABNORMAL LOW (ref 80.0–100.0)
Platelets: 225 10*3/uL (ref 150–400)
RBC: 3.75 MIL/uL — ABNORMAL LOW (ref 3.87–5.11)
RDW: 13.6 % (ref 11.5–15.5)
WBC: 4 10*3/uL (ref 4.0–10.5)
nRBC: 0 % (ref 0.0–0.2)

## 2020-03-24 LAB — MAGNESIUM: Magnesium: 2 mg/dL (ref 1.7–2.4)

## 2020-03-24 LAB — GLUCOSE, CAPILLARY
Glucose-Capillary: 101 mg/dL — ABNORMAL HIGH (ref 70–99)
Glucose-Capillary: 133 mg/dL — ABNORMAL HIGH (ref 70–99)
Glucose-Capillary: 171 mg/dL — ABNORMAL HIGH (ref 70–99)
Glucose-Capillary: 184 mg/dL — ABNORMAL HIGH (ref 70–99)
Glucose-Capillary: 209 mg/dL — ABNORMAL HIGH (ref 70–99)
Glucose-Capillary: 264 mg/dL — ABNORMAL HIGH (ref 70–99)
Glucose-Capillary: 280 mg/dL — ABNORMAL HIGH (ref 70–99)
Glucose-Capillary: 359 mg/dL — ABNORMAL HIGH (ref 70–99)

## 2020-03-24 LAB — PHOSPHORUS: Phosphorus: 2.9 mg/dL (ref 2.5–4.6)

## 2020-03-24 MED ORDER — POTASSIUM CHLORIDE CRYS ER 20 MEQ PO TBCR
40.0000 meq | EXTENDED_RELEASE_TABLET | Freq: Once | ORAL | Status: AC
  Administered 2020-03-24: 40 meq via ORAL
  Filled 2020-03-24: qty 2

## 2020-03-24 MED ORDER — INSULIN ASPART 100 UNIT/ML ~~LOC~~ SOLN
0.0000 [IU] | Freq: Three times a day (TID) | SUBCUTANEOUS | Status: DC
  Administered 2020-03-24: 1 [IU] via SUBCUTANEOUS
  Administered 2020-03-25: 2 [IU] via SUBCUTANEOUS
  Administered 2020-03-25: 4 [IU] via SUBCUTANEOUS
  Administered 2020-03-25: 2 [IU] via SUBCUTANEOUS

## 2020-03-24 MED ORDER — INSULIN ASPART 100 UNIT/ML ~~LOC~~ SOLN: 0.0000 [IU] | Freq: Three times a day (TID) | SUBCUTANEOUS | Status: DC

## 2020-03-24 NOTE — Progress Notes (Signed)
Inpatient Diabetes Program Recommendations  AACE/ADA: New Consensus Statement on Inpatient Glycemic Control (2015)  Target Ranges:  Prepandial:   less than 140 mg/dL      Peak postprandial:   less than 180 mg/dL (1-2 hours)      Critically ill patients:  140 - 180 mg/dL   Lab Results  Component Value Date   GLUCAP 359 (H) 03/24/2020   HGBA1C 9.9 (H) 02/22/2020  Results for KRIS, BURD (MRN [id number]) as of 03/24/2020 10:45  Ref. Range 03/23/2020 19:38 03/23/2020 23:33 03/24/2020 00:11 03/24/2020 03:36 03/24/2020 07:58 03/24/2020 10:15  Glucose-Capillary Latest Ref Range: 70 - 99 mg/dL 381 (H) 44 (LL) 101 (H) 171 (H) 209 (H) 359 (H)    Diabetes history: DM 2 Outpatient Diabetes medications:  Glucotrol 10 mg daily Metformin 500 mg bid Current orders for Inpatient glycemic control:  Levemir 5 units daily Novolog sensitive q 4 hours  Inpatient Diabetes Program Recommendations:    Please consider reducing Novolog correction to very sensitive (0-6 units) tid with meals and HS. Once correction reduced, may need to increase basal.   Thanks,  Adah Perl, RN, BC-ADM Inpatient Diabetes Coordinator Pager [phone]

## 2020-03-24 NOTE — Progress Notes (Signed)
eLink Physician-Brief Progress Note Patient Name: Amanda [name] DOB: [birth year] MRN: [id number]   Date of Service  03/24/2020  HPI/Events of Note  K+ 3.4  eICU Interventions  KCL 40 meq  po x 1 ordered per adult electrolyte replacement protocol.        Kerry Kass [name] 03/24/2020, 4:57 AM

## 2020-03-24 NOTE — Hospital Course (Signed)
75 year old woman presented with acute hypoxic respiratory failure abdominal pain.  Found to have hyponatremia, acute kidney injury, lactic acidosis, significant hyperglycemia, acute liver injury. Admitted by critical care for acute hypoxic respiratory failure secondary to pulmonary edema, requiring BiPAP, lactic acidosis, acute on chronic hyponatremia, acute kidney injury, hyperglycemia.  Was weaned successfully off BiPAP down to nasal cannula.  Successfully diuresed. Treated for possible pneumonia with completion of antibiotics. Acute cholecystitis was considered but HIDA scan was negative, surgery ruled out acute cholecystitis.  Antibiotics stopped.  Acute kidney injury improved with diuresis.  Transferred to [facility] 11/10.  Language barrier noted.

## 2020-03-24 NOTE — Progress Notes (Signed)
PROGRESS NOTE  Amanda [name] WVP:[id number] DOB: 08/11/1945 DOA: 03/19/2020 PCP: [name], MD  Brief History   74 year old woman presented with acute hypoxic respiratory failure abdominal pain.  Found to have hyponatremia, acute kidney injury, lactic acidosis, significant hyperglycemia, acute liver injury. Admitted by critical care for acute hypoxic respiratory failure secondary to pulmonary edema, requiring BiPAP, lactic acidosis, acute on chronic hyponatremia, acute kidney injury, hyperglycemia.  Was weaned successfully off BiPAP down to nasal cannula.  Successfully diuresed. Treated for possible pneumonia with completion of antibiotics. Acute cholecystitis was considered but HIDA scan was negative, surgery ruled out acute cholecystitis.  Antibiotics stopped.  Acute kidney injury improved with diuresis.  Transferred to [facility] 11/10.  Language barrier noted.   A & P  Acute hypoxic respiratory failure secondary to acute diastolic CHF in the setting of acute kidney injury, bilateral pulmonary effusions, possible left lower lobe pneumonia. --Successfully weaned off BiPAP to nasal cannula  --Treated 5 days for pneumonia --wean oxygen as tolerated  Acute kidney injury secondary to volume --Appears to be close to baseline at this point.  Significant improvement over the last 48 hours.  Likely secondary to acute CHF. --follow-up as outpatient  Hypokalemia.  Repleted this AM.  Transaminitis without hyperbilirubinemia  --AST within normal limits.  ALT rapidly trending down.  Secondary to acute CHF.  HIDA scan, general surgery ruled out acute cholecystitis. --no further evaluation planned  Language barrier --Speaks specific dialect of [place] not amenable to hospital-provided translation services.  --I called son but no answer  Acute on chronic hyponatremia --Resolved with diuresis.  Microcytic anemia --Appears to be stable compared to baseline.  Trending up last 72 hours.  Aortic  atherosclerosis --No treatment indicated  Disposition Plan:  Discussion: overall improving. Plan wean oxygen, will discuss with son. PT/OT consults. Change Lasix to PO.  Status is: Inpatient  Remains inpatient appropriate because:Inpatient level of care appropriate due to severity of illness   Dispo: The patient is from: Home              Anticipated d/c is to: Home              Anticipated d/c date is: 1 day              Patient currently is not medically stable to d/c.  DVT prophylaxis: enoxaparin (LOVENOX) injection 30 mg Start: 03/20/20 1000 Place and maintain sequential compression device Start: 03/19/20 2349   Code Status: Prior Family Communication: will call again this afternoon.  Murray Hodgkins, MD  [facility] Direct contact: see [email or web address] (further directions at bottom of note if needed) 7PM-7AM contact night coverage as at bottom of note 03/24/2020, 11:45 AM  LOS: 5 days   Significant Hospital Events   03/19/20 admitted  Consults:  General Surgery  Procedures:  N/A  Significant Diagnostic Tests:  CT A/P >>Small gallstone seen within the region of the gallbladder neck. Pericholecystic fluid seen around the gallbladder. In combination with findings on earlier ultrasound, findings are suggestive of acute cholecystitis. CXR pulmonary edema RUQ [place] cholecystitis changes  Limited ECHO 11/6: EF 60-65%, grade II diastolic dysfunction. RV systolic function normal. Elevated RVSP 41mmHg. Left atria mildly dilated.  Micro Data:  COVID>>negative Bld Cx>> MRSA negative  Antimicrobials:  Zosyn 11/5-11/8 Azithromycin 11/5 only Ceftriaxone 11/5-11/10  Interval History/Subjective  CC: f/u SOB  Pt speaks dialect of [place], no translator available per staff. Son not answering phone presently.  Per RN no events overnight, eating well. RN spoke to son  earlier today who reported pt was mildly confused.  Objective   Vitals:  Vitals:   03/24/20 0800  03/24/20 1058  BP:  (!) 144/77  Pulse:  84  Resp:    Temp: 98.1 F (36.7 C)   SpO2:      Exam:  Physical Exam Vitals reviewed.  Constitutional:      General: She is not in acute distress.    Appearance: Normal appearance. She is not ill-appearing or toxic-appearing.  Cardiovascular:     Rate and Rhythm: Normal rate and regular rhythm.     Heart sounds: No murmur heard.  No gallop.      Comments: Telemetry SR. Pulmonary:     Effort: No respiratory distress.     Breath sounds: No wheezing or rales.  Abdominal:     Palpations: Abdomen is soft.  Musculoskeletal:     Right lower leg: No edema.     Left lower leg: No edema.  Neurological:     General: No focal deficit present.     Mental Status: She is alert.  Psychiatric:     Comments: Cannot assess without interpretor.      I have personally reviewed the following:   Today's Data  . 1 episode of hypoglycemia last night.  Controlled today. .   Scheduled Meds: . aspirin EC  81 mg Oral Daily  . Chlorhexidine Gluconate Cloth  6 each Topical Daily  . enoxaparin (LOVENOX) injection  30 mg Subcutaneous Q24H  . furosemide  40 mg Intravenous Daily  . insulin aspart  0-6 Units Subcutaneous TID WC  . insulin detemir  5 Units Subcutaneous Daily  . metoprolol tartrate  25 mg Oral BID  . pantoprazole  40 mg Oral Daily  . pravastatin  80 mg Oral q1800   Continuous Infusions:  Active Problems:   Language barrier to communication   Hyponatremia   Acute respiratory failure with hypoxia (HCC)   Abdominal distension   Bilateral pleural effusion   AKI (acute kidney injury) ([place])   Microcytic anemia   Aortic atherosclerosis ([place])   LOS: 5 days   How to contact the [facility] Attending or Consulting provider [place] or covering provider during after hours [place], for this patient?  1. Check the care team in [facility] and look for a) attending/consulting [facility] provider listed and b) the [facility] team listed 2. Log into [email or web address] and use  [place]'s universal password to access. If you do not have the password, please contact the hospital operator. 3. Locate the [facility] provider you are looking for under [facility] and page to a number that you can be directly reached. 4. If you still have difficulty reaching the provider, please page the [facility] (Director on Call) for the Hospitalists listed on amion for assistance.

## 2020-03-25 ENCOUNTER — Inpatient Hospital Stay (HOSPITAL_COMMUNITY): Payer: Medicare Other

## 2020-03-25 DIAGNOSIS — E1110 Type 2 diabetes mellitus with ketoacidosis without coma: Secondary | ICD-10-CM | POA: Diagnosis not present

## 2020-03-25 DIAGNOSIS — N179 Acute kidney failure, unspecified: Secondary | ICD-10-CM | POA: Diagnosis not present

## 2020-03-25 DIAGNOSIS — J9601 Acute respiratory failure with hypoxia: Secondary | ICD-10-CM | POA: Diagnosis not present

## 2020-03-25 DIAGNOSIS — R1084 Generalized abdominal pain: Secondary | ICD-10-CM

## 2020-03-25 DIAGNOSIS — R4182 Altered mental status, unspecified: Secondary | ICD-10-CM | POA: Diagnosis not present

## 2020-03-25 DIAGNOSIS — Z789 Other specified health status: Secondary | ICD-10-CM | POA: Diagnosis not present

## 2020-03-25 DIAGNOSIS — E871 Hypo-osmolality and hyponatremia: Secondary | ICD-10-CM | POA: Diagnosis not present

## 2020-03-25 LAB — GLUCOSE, CAPILLARY
Glucose-Capillary: 195 mg/dL — ABNORMAL HIGH (ref 70–99)
Glucose-Capillary: 207 mg/dL — ABNORMAL HIGH (ref 70–99)
Glucose-Capillary: 240 mg/dL — ABNORMAL HIGH (ref 70–99)
Glucose-Capillary: 257 mg/dL — ABNORMAL HIGH (ref 70–99)
Glucose-Capillary: 313 mg/dL — ABNORMAL HIGH (ref 70–99)

## 2020-03-25 MED ORDER — INSULIN DETEMIR 100 UNIT/ML ~~LOC~~ SOLN
7.0000 [IU] | Freq: Every day | SUBCUTANEOUS | Status: DC
  Administered 2020-03-26: 7 [IU] via SUBCUTANEOUS
  Filled 2020-03-25: qty 0.07

## 2020-03-25 MED ORDER — SENNA 8.6 MG PO TABS
1.0000 | ORAL_TABLET | Freq: Every day | ORAL | Status: DC
  Administered 2020-03-25: 8.6 mg via ORAL
  Filled 2020-03-25: qty 1

## 2020-03-25 MED ORDER — POLYETHYLENE GLYCOL 3350 17 G PO PACK
17.0000 g | PACK | Freq: Every day | ORAL | Status: DC
  Administered 2020-03-25: 17 g via ORAL
  Filled 2020-03-25 (×2): qty 1

## 2020-03-25 MED ORDER — INSULIN ASPART 100 UNIT/ML ~~LOC~~ SOLN
0.0000 [IU] | Freq: Three times a day (TID) | SUBCUTANEOUS | Status: DC
  Administered 2020-03-26 (×2): 2 [IU] via SUBCUTANEOUS
  Administered 2020-03-26: 7 [IU] via SUBCUTANEOUS

## 2020-03-25 MED ORDER — FUROSEMIDE 20 MG PO TABS
20.0000 mg | ORAL_TABLET | Freq: Every day | ORAL | Status: DC
  Administered 2020-03-26: 20 mg via ORAL
  Filled 2020-03-25: qty 1

## 2020-03-25 NOTE — Progress Notes (Signed)
Patient reported that they are unable to hear out of their left when attempting to speak to son on the phone, RN notified MD and is awaiting new orders in this regard. RN will continue to monitor this patient.

## 2020-03-25 NOTE — Evaluation (Signed)
Occupational Therapy Evaluation Patient Details Name: Amanda [name] MRN: [id number] DOB: 12/18/1945 Today's Date: 03/25/2020    History of Present Illness Pt is a 74 y/o female with PMH of HTN, CAD, DM2, presenting with acute hypoxic respiratory failure, abdominal pain. Found to have hyponatremia, AKI, hyperglycemia, acute liver injury. Admitted for acute hypoxic respiratory failure due to pulmonary edema, CHF, possible left lower lobe PNA.     Clinical Impression   PTA patient independent with ADls, mobility.  Admitted for above and limited by problem list below, including generalized weakness, decreased activity tolerance and impaired balance.  Patient requires min assist for bed mobility, min assist +1 safety for transfers and in room mobility using RW, min assist for LB ADls and mod assist for toileting. Language barrier during session, as translator not available, but pt familiar to therapist from last admission when OT spoke to son on phone, and patient continues to respond well to gestures. She is mildly confused, requires increased time and multimodal cueing to to respond, decreased safety with RW (but this is unfamiliar as well).  Patient will benefit from continued OT services while admitted and after dc at [facility] level to optimize independence and safety with ADLs, mobility given 24/7 support at home.     Follow Up Recommendations  Home health OT;Supervision/Assistance - 24 hour    Equipment Recommendations  3 in 1 bedside commode;Tub/shower seat    Recommendations for Other Services       Precautions / Restrictions Precautions Precautions: Fall Restrictions Weight Bearing Restrictions: No      Mobility Bed Mobility Overal bed mobility: Needs Assistance Bed Mobility: Supine to Sit     Supine to sit: Min assist     General bed mobility comments: min assist for initation and foward scoot to EOB     Transfers Overall transfer level: Needs assistance Equipment  used: Rolling walker (2 wheeled);None Transfers: Public house manager;Sit to/from Stand Sit to Stand: Mod assist;+2 physical assistance   Squat pivot transfers: Min assist;+2 safety/equipment     General transfer comment: inital pivot to [facility] with mod assist +2 without full ascend and +2 required physically, min assist when presented with RW and +2 for safety only    Balance Overall balance assessment: Needs assistance Sitting-balance support: No upper extremity supported;Feet supported Sitting balance-Leahy Scale: Fair     Standing balance support: Bilateral upper extremity supported;During functional activity Standing balance-Leahy Scale: Poor Standing balance comment: relies on BUE and external support                           ADL either performed or assessed with clinical judgement   ADL Overall ADL's : Needs assistance/impaired     Grooming: Minimal assistance;Standing   Upper Body Bathing: Set up;Sitting   Lower Body Bathing: Minimal assistance;Sit to/from stand   Upper Body Dressing : Minimal assistance;Sitting   Lower Body Dressing: Minimal assistance;Sit to/from stand Lower Body Dressing Details (indicate cue type and reason): able to adjust socks in sitting, min assist sit to stand with RW  Toilet Transfer: Minimal assistance;Ambulation;RW;BSC Toilet Transfer Details (indicate cue type and reason): simulated in room  Toileting- Clothing Manipulation and Hygiene: Moderate assistance;Sit to/from stand Toileting - Clothing Manipulation Details (indicate cue type and reason): clothing mgmt and hygiene      Functional mobility during ADLs: Minimal assistance;Rolling walker;+2 for safety/equipment General ADL Comments: pt limited by weakness and decreased activity tolerance     Vision  Perception     Praxis      Pertinent Vitals/Pain Pain Assessment: Faces Faces Pain Scale: No hurt Pain Intervention(s): Monitored during session      Hand Dominance     Extremity/Trunk Assessment Upper Extremity Assessment Upper Extremity Assessment: Generalized weakness   Lower Extremity Assessment Lower Extremity Assessment: Defer to PT evaluation   Cervical / Trunk Assessment Cervical / Trunk Assessment: Normal   Communication Communication Communication: Prefers language other than English ([place])   Cognition Arousal/Alertness: Awake/alert Behavior During Therapy: WFL for tasks assessed/performed Overall Cognitive Status: Difficult to assess                                 General Comments: language barrier, patient appears mildly confused but easily redirected with gestures    General Comments       Exercises     Shoulder Instructions      Home Living Family/patient expects to be discharged to:: Private residence Living Arrangements: Children Available Help at Discharge: Family;Available 24 hours/day Type of Home: House Home Access: Stairs to enter [employer] of Steps: 4 Entrance Stairs-Rails:  (+ rail) Home Layout: Two level;Able to live on main level with bedroom/bathroom;Full bath on main level     Bathroom Shower/Tub: Occupational psychologist: Standard     Home Equipment: Cane - single point   Additional Comments: per chart review       Prior Functioning/Environment Level of Independence: Independent        Comments: per chart review         OT Problem List: Decreased strength;Decreased activity tolerance;Impaired balance (sitting and/or standing);Decreased cognition;Decreased safety awareness;Decreased knowledge of use of DME or AE;Decreased knowledge of precautions      OT Treatment/Interventions: Self-care/ADL training;DME and/or AE instruction;Therapeutic activities;Balance training;Patient/family education;Therapeutic exercise;Energy conservation    OT Goals(Current goals can be found in the care plan section) Acute Rehab OT Goals OT Goal  Formulation: Patient unable to participate in goal setting Time For Goal Achievement: 04/08/20 Potential to Achieve Goals: Good  OT Frequency: Min 2X/week   Barriers to D/C:            Co-evaluation              AM-PAC OT "6 Clicks" Daily Activity     Outcome Measure Help from another person eating meals?: A Little Help from another person taking care of personal grooming?: A Little Help from another person toileting, which includes using toliet, bedpan, or urinal?: A Lot Help from another person bathing (including washing, rinsing, drying)?: A Little Help from another person to put on and taking off regular upper body clothing?: A Little Help from another person to put on and taking off regular lower body clothing?: A Little 6 Click Score: 17   End of Session Equipment Utilized During Treatment: Rolling walker Nurse Communication: Mobility status  Activity Tolerance: Patient tolerated treatment well Patient left: in chair;with call bell/phone within reach;with chair alarm set  OT Visit Diagnosis: Muscle weakness (generalized) (M62.81);Unsteadiness on feet (R26.81)                Time: 0277-4128 OT Time Calculation (min): 16 min Charges:  OT General Charges $OT Visit: 1 Visit OT Evaluation $OT Eval Moderate Complexity: 1 Mod  Jolaine Artist, OT Acute Rehabilitation Services Pager [phone] Office [phone]   Delight Stare 03/25/2020, 12:44 PM

## 2020-03-25 NOTE — Care Management Important Message (Signed)
Important Message  Patient Details  Name: Amanda [name] MRN: [id number] Date of Birth: Jul 29, 1945   Medicare Important Message Given:  Yes     Yates Weisgerber P Warren 03/25/2020, 1:50 PM

## 2020-03-25 NOTE — Progress Notes (Addendum)
PROGRESS NOTE  Amanda [name] RKY:[id number] DOB: 02-10-1946 DOA: 03/19/2020 PCP: [name], MD  Brief History   74 year old woman presented with acute hypoxic respiratory failure abdominal pain.  Found to have hyponatremia, acute kidney injury, lactic acidosis, significant hyperglycemia, acute liver injury. Admitted by critical care for acute hypoxic respiratory failure secondary to pulmonary edema, requiring BiPAP, lactic acidosis, acute on chronic hyponatremia, acute kidney injury, hyperglycemia.  Was weaned successfully off BiPAP down to nasal cannula.  Successfully diuresed. Treated for possible pneumonia with completion of antibiotics. Acute cholecystitis was considered but HIDA scan was negative, surgery ruled out acute cholecystitis.  Antibiotics stopped.  Acute kidney injury improved with diuresis.  Transferred to [facility] 11/10.  Language barrier noted.   A & P  Acute hypoxic respiratory failure secondary to acute diastolic CHF in the setting of acute kidney injury, bilateral pulmonary effusions, possible left lower lobe pneumonia. --Successfully weaned off BiPAP. Now on RA --Treated 5 days for pneumonia  Acute kidney injury secondary to volume --Appears to be close to baseline last check.  Secondary to acute CHF. --follow-up as outpatient  Transaminitis without hyperbilirubinemia  --AST within normal limits last check.  ALT rapidly trended down.  Secondary to acute CHF.  HIDA scan, general surgery ruled out acute cholecystitis. --no further evaluation planned  Language barrier --Speaks specific dialect of [place] not amenable to hospital-provided translation services.  --I called son but no answer x2 while in room  Acute on chronic hyponatremia --Resolved with diuresis.  Microcytic anemia --Appears to be stable compared to baseline.  Trending up last 72 hours.  Aortic atherosclerosis --No treatment indicated  Disposition Plan:  Discussion: much improved but has abd  pain, will check AXR and f/u. HHPTOT planned. May be able to go home tomorrow if better.   ADDENDUM LATE ENTRY RN notified me 11/11 evening that pt reported could not hear out of left ear. I examined ears w/ otoscope. Left canal was clear. Possible TM effusion but no sig abnormalities noted. Son spoke to pt and pt was able to ear out of both ears w/o disparity.  No futher evaluation planned inpatient. Rec f/u w/ PCP one week on discharge, recheck.  Status is: Inpatient  Remains inpatient appropriate because:Inpatient level of care appropriate due to severity of illness   Dispo: The patient is from: Home              Anticipated d/c is to: Home              Anticipated d/c date is: 1 day              Patient currently is not medically stable to d/c.  DVT prophylaxis: enoxaparin (LOVENOX) injection 30 mg Start: 03/20/20 1000 Place and maintain sequential compression device Start: 03/19/20 2349   Code Status: Prior Family Communication: I updated son by phone this afternoon  Murray Hodgkins, MD  [facility] Direct contact: see [email or web address] (further directions at bottom of note if needed) 7PM-7AM contact night coverage as at bottom of note 03/25/2020, 2:38 PM  LOS: 6 days   Significant Hospital Events   03/19/20 admitted  Consults:  General Surgery  Procedures:  N/A  Significant Diagnostic Tests:  CT A/P >>Small gallstone seen within the region of the gallbladder neck. Pericholecystic fluid seen around the gallbladder. In combination with findings on earlier ultrasound, findings are suggestive of acute cholecystitis. CXR pulmonary edema RUQ [place] cholecystitis changes  Limited ECHO 11/6: EF 60-65%, grade II diastolic dysfunction. RV systolic function  normal. Elevated RVSP 7mmHg. Left atria mildly dilated.  Micro Data:  COVID>>negative Bld Cx>> MRSA negative  Antimicrobials:  Zosyn 11/5-11/8 Azithromycin 11/5 only Ceftriaxone 11/5-11/10  Interval  History/Subjective  CC: f/u SOB  No translator available Called son twice, no answer Pt points to stomach and grimaces No issues noted per nurse.  Objective   Vitals:  Vitals:   03/25/20 0417 03/25/20 0845  BP: (!) 157/73 128/67  Pulse: 66 75  Resp: 18 20  Temp: 99.4 F (37.4 C) 98.4 F (36.9 C)  SpO2: 97% 98%    Exam:  Constitutional:   . Appears calm and mildly uncomfortable ENMT:  . grossly normal hearing  Respiratory:  . CTA bilaterally, no w/r/r.  . Respiratory effort normal.  Cardiovascular:  . RRR, no m/r/g . No LE extremity edema   Abdomen:  . Soft, ntnd, skin appears unremarkable, +BS Psychiatric:  . Mental status o Mood, affect seem appropriate  I have personally reviewed the following:   Today's Data  . CBG remains stable  Scheduled Meds: . aspirin EC  81 mg Oral Daily  . Chlorhexidine Gluconate Cloth  6 each Topical Daily  . enoxaparin (LOVENOX) injection  30 mg Subcutaneous Q24H  . insulin aspart  0-6 Units Subcutaneous TID WC  . insulin detemir  5 Units Subcutaneous Daily  . metoprolol tartrate  25 mg Oral BID  . pantoprazole  40 mg Oral Daily  . pravastatin  80 mg Oral q1800   Continuous Infusions:  Active Problems:   Language barrier to communication   Hyponatremia   Acute respiratory failure with hypoxia (HCC)   Abdominal distension   Bilateral pleural effusion   AKI (acute kidney injury) ([place])   Microcytic anemia   Aortic atherosclerosis ([place])   LOS: 6 days   How to contact the [facility] Attending or Consulting provider [place] or covering provider during after hours [place], for this patient?  1. Check the care team in [facility] and look for a) attending/consulting TRH provider listed and b) the [facility] team listed 2. Log into [email or web address] and use [place]'s universal password to access. If you do not have the password, please contact the hospital operator. 3. Locate the [facility] provider you are looking for under [facility] and page to  a number that you can be directly reached. 4. If you still have difficulty reaching the provider, please page the [facility] (Director on Call) for the Hospitalists listed on amion for assistance.

## 2020-03-25 NOTE — Evaluation (Signed)
Physical Therapy Evaluation Patient Details Name: Amanda [name] MRN: [id number] DOB: Jul 16, 1945 Today's Date: 03/25/2020   History of Present Illness  Pt is a 74 y/o female with PMH of HTN, CAD, DM2, presenting with acute hypoxic respiratory failure, abdominal pain. Found to have hyponatremia, AKI, hyperglycemia, acute liver injury. Admitted for acute hypoxic respiratory failure due to pulmonary edema, CHF, possible left lower lobe PNA.    Clinical Impression   Patient received in bed, translator not available today but she is familiar to this therapist from past admissions and responded well to hand gestures throughout session. Much weaker than in previous admissions, and required as much as ModAx2 for mobility; did well with RW but did need visual cues and close guarding for safety with this device (although she is unfamiliar with it). Only able to tolerate short distance in room ambulation today due to fatigue. Left up in recliner with all needs met, chair alarm active. She does have good family support at home- seems reasonable to return home with HHPT and 24/7A once medically ready.     Follow Up Recommendations Home health PT;Supervision/Assistance - 24 hour    Equipment Recommendations  Rolling walker with 5" wheels;3in1 (PT)    Recommendations for Other Services       Precautions / Restrictions Precautions Precautions: Fall Restrictions Weight Bearing Restrictions: No      Mobility  Bed Mobility Overal bed mobility: Needs Assistance Bed Mobility: Supine to Sit     Supine to sit: Min assist     General bed mobility comments: min assist for initation and foward scoot to EOB     Transfers Overall transfer level: Needs assistance Equipment used: Rolling walker (2 wheeled);None Transfers: Public house manager;Sit to/from Stand Sit to Stand: +2 physical assistance;Min assist   Squat pivot transfers: +2 safety/equipment;Mod assist     General transfer comment:  initially transferred to [facility] via squat pivot as she did not fully extend her knees and needed ModAx2; able to complete other transfers with MinAx1 with RW and standby of second person for safety only  Ambulation/Gait Ambulation/Gait assistance: Min guard;+2 safety/equipment Gait Distance (Feet): 10 Feet Assistive device: Rolling walker (2 wheeled) Gait Pattern/deviations: Step-through pattern;Decreased step length - right;Decreased step length - left;Decreased stride length;Trunk flexed Gait velocity: decreased   General Gait Details: very weak and fatigued with activity, min guardx2 for safety given possible mild confusion but did respond well to hand gestures  Stairs            Wheelchair Mobility    Modified Rankin (Stroke Patients Only)       Balance Overall balance assessment: Needs assistance Sitting-balance support: No upper extremity supported;Feet supported Sitting balance-Leahy Scale: Fair     Standing balance support: Bilateral upper extremity supported;During functional activity Standing balance-Leahy Scale: Poor Standing balance comment: relies on BUE and external support                             Pertinent Vitals/Pain Pain Assessment: Faces Faces Pain Scale: No hurt Pain Intervention(s): Limited activity within patient's tolerance;Monitored during session    [place] expects to be discharged to:: Private residence Living Arrangements: Children Available Help at Discharge: Family;Available 24 hours/day Type of Home: House Home Access: Stairs to enter Entrance Stairs-Rails:  (+ rail) Technical brewer of Steps: 4 Home Layout: Two level;Able to live on main level with bedroom/bathroom;Full bath on main level Home Equipment: Cane - single point Additional  Comments: per chart review     Prior Function Level of Independence: Independent         Comments: per chart review      Hand Dominance         Extremity/Trunk Assessment   Upper Extremity Assessment Upper Extremity Assessment: Defer to OT evaluation    Lower Extremity Assessment Lower Extremity Assessment: Generalized weakness    Cervical / Trunk Assessment Cervical / Trunk Assessment: Normal  Communication   Communication: Prefers language other than English ([place])  Cognition Arousal/Alertness: Awake/alert Behavior During Therapy: WFL for tasks assessed/performed Overall Cognitive Status: Difficult to assess                                 General Comments: language barrier, patient appears mildly confused but easily redirected with gestures       General Comments      Exercises     Assessment/Plan    PT Assessment Patient needs continued PT services  PT Problem List Decreased strength;Decreased cognition;Decreased knowledge of use of DME;Decreased activity tolerance;Decreased safety awareness;Decreased balance;Decreased mobility;Decreased coordination       PT Treatment Interventions DME instruction;Balance training;Gait training;Stair training;Cognitive remediation;Functional mobility training;Patient/family education;Therapeutic activities;Therapeutic exercise    PT Goals (Current goals can be found in the Care Plan section)  Acute Rehab PT Goals PT Goal Formulation: Patient unable to participate in goal setting Time For Goal Achievement: 04/08/20 Potential to Achieve Goals: Fair    Frequency Min 3X/week   Barriers to discharge        Co-evaluation               AM-PAC PT "6 Clicks" Mobility  Outcome Measure Help needed turning from your back to your side while in a flat bed without using bedrails?: A Little Help needed moving from lying on your back to sitting on the side of a flat bed without using bedrails?: A Little Help needed moving to and from a bed to a chair (including a wheelchair)?: A Lot Help needed standing up from a chair using your arms (e.g., wheelchair or  bedside chair)?: A Little Help needed to walk in hospital room?: A Little Help needed climbing 3-5 steps with a railing? : Total 6 Click Score: 15    End of Session   Activity Tolerance: Patient tolerated treatment well Patient left: in chair;with call bell/phone within reach;with chair alarm set Nurse Communication: Mobility status PT Visit Diagnosis: Muscle weakness (generalized) (M62.81);Unsteadiness on feet (R26.81);Difficulty in walking, not elsewhere classified (R26.2)    Time: 4481-8563 PT Time Calculation (min) (ACUTE ONLY): 16 min   Charges:   PT Evaluation $PT Eval Moderate Complexity: 1 Mod          Windell Norfolk, DPT, PN1   Supplemental Physical Therapist [place]    Pager [phone] Acute Rehab Office [phone]

## 2020-03-26 DIAGNOSIS — E1110 Type 2 diabetes mellitus with ketoacidosis without coma: Secondary | ICD-10-CM | POA: Diagnosis not present

## 2020-03-26 DIAGNOSIS — J189 Pneumonia, unspecified organism: Secondary | ICD-10-CM | POA: Diagnosis not present

## 2020-03-26 DIAGNOSIS — R4182 Altered mental status, unspecified: Secondary | ICD-10-CM | POA: Diagnosis not present

## 2020-03-26 DIAGNOSIS — J90 Pleural effusion, not elsewhere classified: Secondary | ICD-10-CM | POA: Diagnosis not present

## 2020-03-26 DIAGNOSIS — E871 Hypo-osmolality and hyponatremia: Secondary | ICD-10-CM | POA: Diagnosis not present

## 2020-03-26 DIAGNOSIS — J9601 Acute respiratory failure with hypoxia: Secondary | ICD-10-CM | POA: Diagnosis not present

## 2020-03-26 LAB — BASIC METABOLIC PANEL
Anion gap: 10 (ref 5–15)
BUN: 33 mg/dL — ABNORMAL HIGH (ref 8–23)
CO2: 23 mmol/L (ref 22–32)
Calcium: 9.9 mg/dL (ref 8.9–10.3)
Chloride: 103 mmol/L (ref 98–111)
Creatinine, Ser: 1.62 mg/dL — ABNORMAL HIGH (ref 0.44–1.00)
GFR, Estimated: 33 mL/min — ABNORMAL LOW (ref >60)
Glucose, Bld: 205 mg/dL — ABNORMAL HIGH (ref 70–99)
Potassium: 3.9 mmol/L (ref 3.5–5.1)
Sodium: 136 mmol/L (ref 135–145)

## 2020-03-26 LAB — GLUCOSE, CAPILLARY
Glucose-Capillary: 197 mg/dL — ABNORMAL HIGH (ref 70–99)
Glucose-Capillary: 326 mg/dL — ABNORMAL HIGH (ref 70–99)
Glucose-Capillary: 171 mg/dL — ABNORMAL HIGH (ref 70–99)

## 2020-03-26 MED ORDER — FUROSEMIDE 20 MG PO TABS
20.0000 mg | ORAL_TABLET | Freq: Every day | ORAL | 0 refills | Status: DC
Start: 2020-03-27 — End: 2021-09-19

## 2020-03-26 MED ORDER — HYDRALAZINE HCL 20 MG/ML IJ SOLN
10.0000 mg | Freq: Four times a day (QID) | INTRAMUSCULAR | Status: DC | PRN
  Administered 2020-03-26: 10 mg via INTRAVENOUS
  Filled 2020-03-26: qty 1

## 2020-03-26 NOTE — TOC Initial Note (Signed)
Transition of Care [facility]) - Initial/Assessment Note    Patient Details  Name: Amanda [name] MRN: [id number] Date of Birth: 06-Jul-1945  Transition of [facility]) CM/SW Contact:    Bartholomew Crews, RN Phone Number: [phone] 03/26/2020, 10:52 AM  Clinical Narrative:                  Spoke with patient's son, Margrit Minner, on the phone. Patient speaks a rare dialect [place]) and unable to use interpreter service. PTA home with son. No DME or [place] services needed. Discussed recommendations for [facility] PT/OT and DME 3N1/RW. Agreeable. Offered choice of angencies. Referral for [facility] PT/OT accepted by [facility]. Referral for DME sent to [facility] for delivery to the room. HH/DME orders requested.   Verified current PCP in Epic and preferred pharmacy as correct. Address updated in Epic d/t patient having recently moved - current address is [address] [place], [place] [address].   Son to provide transport home when patient ready.   No further TOC needs identified.   Expected Discharge Plan: [place] Barriers to Discharge: No Barriers Identified   Patient Goals and CMS Choice   CMS Medicare.gov Compare Post Acute Care list provided to:: Patient Represenative (must comment) (Dr. [name] (son)) Choice offered to / list presented to : Adult Children  Expected Discharge Plan and Services Expected Discharge Plan: [place] In-house Referral: Interpreting Services (dialect rare - unable to use interpreting service) Discharge Planning Services: CM Consult Post Acute Care Choice: [place] arrangements for the past 2 months: [place]                 DME Arranged: 3-N-1, [place] Rile DME Agency: [facility] Date DME Agency Contacted: 03/26/20 Time DME Agency Contacted: [phone] Representative spoke with at DME Agency: Freda Munro HH Arranged: PT, OT [place] Agency: [facility] Date [place]: 03/26/20 Time [place]:  75 Representative spoke with at [place] Arrangements/Services Living arrangements for the past 2 months: [place] with:: Self, Adult Children Patient language and need for interpreter reviewed:: Yes Do you feel safe going back to the place where you live?: Yes      Need for Family Participation in Patient Care: Yes (Comment) Care giver support system in place?: Yes (comment)   Criminal Activity/Legal Involvement Pertinent to Current Situation/Hospitalization: No - Comment as needed  Activities of Daily Living      Permission Sought/Granted                  Emotional Assessment         Alcohol / Substance Use: Not Applicable Psych Involvement: No (comment)  Admission diagnosis:  Hyponatremia [E87.1] DKA (diabetic ketoacidosis) ([place]) [E11.10] Elevated LFTs [R79.89] Acute respiratory failure with hypoxia ([place]) [J96.01] Diabetic ketoacidosis without coma associated with type 2 diabetes mellitus ([place]) [E11.10] Community acquired pneumonia of right lower lobe of lung [J18.9] Patient Active Problem List   Diagnosis Date Noted  . Language barrier to communication 03/24/2020  . Bilateral pleural effusion 03/24/2020  . AKI (acute kidney injury) ([place]) 03/24/2020  . Microcytic anemia 03/24/2020  . Aortic atherosclerosis ([place]) 03/24/2020  . Abdominal distension   . Acute respiratory failure with hypoxia ([place]) 03/19/2020  . Hyponatremia 02/22/2020  . 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 CAD (coronary artery disease) 10/04/2011  . Type 2 diabetes mellitus ([place]) 10/04/2011  . Chest pain 09/25/2011  . HELICOBACTER  PYLORI INFECTION 01/25/2009  . DIABETIC FOOT ULCER 01/22/2009  . GERD 12/30/2008  . MURMUR 11/26/2008  . URINARY HESITANCY 11/26/2008  . HYPERCHOLESTEROLEMIA 09/14/2008  . Hypertension 09/14/2008  . CONSTIPATION 06/08/2008  . PRURITUS 05/21/2008  . DRY SKIN 05/21/2008  . 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

## 2020-03-26 NOTE — Discharge Summary (Addendum)
Physician Discharge Summary  Amanda [name] GYF:[id number] DOB: 11-16-1945 DOA: 03/19/2020  PCP: [name], MD  Admit date: 03/19/2020 Discharge date: 03/26/2020  Time spent: 35 minutes  Recommendations for Outpatient Follow-up:  1. PCP in 1 week with [facility] 2. Home health PT OT   Discharge Diagnoses:  Active Problems:   Hyponatremia   Acute respiratory failure with hypoxia (HCC)   Abdominal distension   Language barrier to communication   Bilateral pleural effusion   AKI (acute kidney injury) ([place])   Microcytic anemia   Aortic atherosclerosis ([place])   Diabetes Mellitus  Discharge Condition: stable  Diet recommendation: low sodium diabetic  Filed Weights   03/19/20 1900 03/24/20 1945  Weight: 53.6 kg 51.8 kg    History of present illness:  74 year old woman presented with acute hypoxic respiratory failure abdominal pain.  Found to have hyponatremia, acute kidney injury, lactic acidosis, significant hyperglycemia, acute liver injury. Admitted by critical care for acute hypoxic respiratory failure secondary to pulmonary edema, requiring BiPAP, lactic acidosis, acute on chronic hyponatremia, acute kidney injury, hyperglycemia.  Was weaned successfully off BiPAP down to nasal cannula.  Successfully diuresed. Treated for possible pneumonia with completion of antibiotics. Acute cholecystitis was considered but HIDA scan was negative, surgery ruled out acute cholecystitis.  Antibiotics stopped.  Acute kidney injury improved with diuresis.  Transferred to [facility] 11/10.  Language barrier noted.  Hospital Course:   Acute hypoxic respiratory failure secondary to acute diastolic CHF in the setting of acute kidney injury, bilateral pulmonary effusions, possible left lower lobe pneumonia. Sepsis ruled out. --Admitted to the ICU initially, treated with antibiotics and diuretics, required BiPAP initially now off, weaned off O2 down to room air  -2D echo noted preserved EF, grade 2 diastolic  dysfunction -Also completed 5-day course of antibiotics for pneumonia  -Diuretics transition to Lasix 20 mg daily  -Needs PCP follow-up and BMP check in 1 week  Acute kidney injury secondary to volume overload --Appears to be close to baseline last check.  Secondary to acute CHF. --follow-up as outpatient -Creatinine 1.6 at discharge  Transaminitis without hyperbilirubinemia  --AST within normal limits last check.  ALT rapidly trended down.  Secondary to acute CHF.  HIDA scan, general surgery ruled out acute cholecystitis. --no further evaluation planned  Language barrier --Speaks specific dialect of [place] not amenable to hospital-provided translation services.   Acute on chronic hyponatremia --Resolved with diuresis.  Microcytic anemia --Appears to be stable compared to baseline.   -Defer this to PCP  Aortic atherosclerosis --Needs risk factor modification    Consultations:  PCCM transfer  Discharge Exam: Vitals:   03/26/20 0700 03/26/20 0914  BP: 128/83 (!) 153/86  Pulse: 87 81  Resp: 18   Temp: 98.6 F (37 C)   SpO2: 97%     General: awake alert pleasant, sitting up in bed eating breakfast Cardiovascular: S1-S2, regular rate rhythm Respiratory: Clear  Discharge Instructions    Allergies as of 03/26/2020   No Known Allergies     Medication List    TAKE these medications   amLODipine 10 MG tablet Commonly known as: NORVASC Take 10 mg by mouth daily.   antiseptic oral rinse Liqd 15 mLs by Mouth Rinse route as needed for dry mouth.   aspirin EC 81 MG tablet Take 81 mg by mouth daily. Swallow whole.   diclofenac Sodium 1 % Gel Commonly known as: VOLTAREN Apply 1 application topically 4 (four) times daily as needed (pain).   furosemide 20 MG tablet Commonly  known as: LASIX Take 1 tablet (20 mg total) by mouth daily. Start taking on: March 27, 2020   glipiZIDE 10 MG tablet Commonly known as: GLUCOTROL Take 10 mg by mouth daily.    linagliptin 5 MG Tabs tablet Commonly known as: TRADJENTA Take 5 mg by mouth daily.   metFORMIN 500 MG tablet Commonly known as: GLUCOPHAGE Take 500 mg by mouth 2 (two) times daily.   metoprolol tartrate 25 MG tablet Commonly known as: LOPRESSOR Take 0.5 tablets (12.5 mg total) by mouth 2 (two) times daily.   pantoprazole 40 MG tablet Commonly known as: PROTONIX Take 1 tablet (40 mg total) by mouth daily.   phenol 1.4 % Liqd Commonly known as: CHLORASEPTIC Use as directed 1 spray in the mouth or throat as needed for throat irritation / pain.   pravastatin 80 MG tablet Commonly known as: PRAVACHOL Take 80 mg by mouth at bedtime.   traMADol 50 MG tablet Commonly known as: ULTRAM Take 1 tablet (50 mg total) by mouth every 6 (six) hours as needed for moderate pain. What changed: how much to take            Durable Medical Equipment  (From admission, onward)         Start     Ordered   03/26/20 1041  For home use only DME Walker rolling  Once       Question Answer Comment  Walker: With [facility]   Patient needs a walker to treat with the following condition Decreased functional mobility and endurance      03/26/20 1040   03/26/20 1041  For home use only DME 3 n 1  Once        03/26/20 1040         No Known Allergies  Follow-up Information    [facility] Follow up.   Why: the office will call to schedule home health visits Contact information: [address] [place], [place] [address] [phone]               The results of significant diagnostics from this hospitalization (including imaging, microbiology, ancillary and laboratory) are listed below for reference.    Significant Diagnostic Studies: DG Abd 1 View  Result Date: 03/20/2020 CLINICAL DATA:  Abdominal distension. EXAM: ABDOMEN - 1 VIEW COMPARISON:  March 19, 2020 FINDINGS: There is mild gaseous distention of the small bowel and colon. There is a moderate  amount of stool throughout the colon. There is no definite pneumatosis or free air. No radiopaque kidney stones. IMPRESSION: Mild gaseous distention of the small bowel and colon. Moderate amount of stool throughout the colon. Electronically Signed   By: Constance Holster M.D.   On: 03/20/2020 19:55   CT Head Wo Contrast  Result Date: 03/19/2020 CLINICAL DATA:  Mental status change, unknown cause. Altered mental status. Shortness of breath and chest pain. EXAM: CT HEAD WITHOUT CONTRAST TECHNIQUE: Contiguous axial images were obtained from the base of the skull through the vertex without intravenous contrast. COMPARISON:  No pertinent prior exams are available for comparison. FINDINGS: Brain: Mild generalized cerebral atrophy. Moderate ill-defined hypoattenuation within the cerebral white matter is nonspecific, but compatible chronic small vessel ischemic disease. There is no acute intracranial hemorrhage. No demarcated cortical infarct. No extra-axial fluid collection. No evidence of intracranial mass. No midline shift. Vascular: No hyperdense vessel.  Atherosclerotic calcifications. Skull: No acute calvarial fracture or aggressive osseous lesion. Sinuses/Orbits: Visualized orbits show no acute finding.  Trace ethmoid sinus mucosal thickening. No significant mastoid effusion. IMPRESSION: No evidence of acute intracranial abnormality. Mild cerebral atrophy with moderate chronic small vessel ischemic disease. Electronically Signed   By: Kellie Simmering DO   On: 03/19/2020 19:04   NM Hepatobiliary Liver Func  Result Date: 03/22/2020 CLINICAL DATA:  Concern for acute cholecystitis. EXAM: NUCLEAR MEDICINE HEPATOBILIARY IMAGING TECHNIQUE: Sequential images of the abdomen were obtained out to 60 minutes following intravenous administration of radiopharmaceutical. RADIOPHARMACEUTICALS:  4.9 mCi Tc-22m  Choletec IV COMPARISON:  CT abdomen pelvis-03/20/2020; right upper quadrant abdominal ultrasound-03/19/2020 FINDINGS:  There is homogeneous distribution of injected radiotracer throughout the hepatic parenchyma. There is early excretion of radiotracer with opacification of the gallbladder, initially seen on 15 minutes anterior projection planar image. There is passage of contrast through the common bile duct to the level of the proximal small bowel, initially seen on the 20 minutes anterior projection planar image. IMPRESSION: 1. No scintigraphic evidence of acute cholecystitis. 2. Note, given patient's respiratory status requiring BiPAP, the patient was unable to drink an Ensure (Fatty meal supplement) to evaluate for biliary dyskinesia. (CCK was not available for administration.) Electronically Signed   By: Sandi Mariscal M.D.   On: 03/22/2020 13:15   CT ABDOMEN PELVIS W CONTRAST  Result Date: 03/20/2020 CLINICAL DATA:  Abdominal pain EXAM: CT ABDOMEN AND PELVIS WITH CONTRAST TECHNIQUE: Multidetector CT imaging of the abdomen and pelvis was performed using the standard protocol following bolus administration of intravenous contrast. CONTRAST:  119mL OMNIPAQUE IOHEXOL 300 MG/ML  SOLN COMPARISON:  Ultrasound 03/19/2020 FINDINGS: Lower chest: Small bilateral pleural effusions. Bibasilar atelectasis. Hepatobiliary: Small high-density structure in the region of the gallbladder neck, likely small stone. Other stones that were seen on prior ultrasound not visualized. Pericholecystic fluid noted. No focal hepatic abnormality. Pancreas: No focal abnormality or ductal dilatation. Spleen: No focal abnormality.  Normal size. Adrenals/Urinary Tract: No renal or adrenal mass. No hydronephrosis. Urinary bladder unremarkable. Stomach/Bowel: Stomach, large and small bowel grossly unremarkable. Normal appendix. Vascular/Lymphatic: Heavily calcified aorta and iliac vessels. No evidence of aneurysm or adenopathy. Reproductive: Uterus and adnexa unremarkable.  No mass. Other: No free fluid or free air. Musculoskeletal: No acute bony abnormality.  Degenerative changes in the lumbar spine. IMPRESSION: Small gallstone seen within the region of the gallbladder neck. Pericholecystic fluid seen around the gallbladder. In combination with findings on earlier ultrasound, findings are suggestive of acute cholecystitis. Small bilateral pleural effusions with bibasilar atelectasis. Aortic atherosclerosis. Electronically Signed   By: Rolm Baptise M.D.   On: 03/20/2020 00:16   DG CHEST PORT 1 VIEW  Result Date: 03/24/2020 CLINICAL DATA:  Shortness of breath EXAM: PORTABLE CHEST 1 VIEW COMPARISON:  March 22, 2020 FINDINGS: There is persistent ill-defined opacity in the left lower lobe. There is a small right pleural effusion. Lungs elsewhere clear. Heart is mildly enlarged with pulmonary vascularity normal. Patient is status post coronary artery bypass grafting. No adenopathy. There is aortic atherosclerosis. No bone lesions. IMPRESSION: Persistent ill-defined opacity left lower lobe concerning for focus of pneumonia. Small right pleural effusion. Stable cardiac prominence. Postoperative changes noted. Aortic Atherosclerosis (ICD10-I70.0). Electronically Signed   By: Lowella Grip III M.D.   On: 03/24/2020 07:53   DG CHEST PORT 1 VIEW  Result Date: 03/22/2020 CLINICAL DATA:  Shortness of breath EXAM: PORTABLE CHEST 1 VIEW COMPARISON:  03/19/2020 FINDINGS: Cardiac shadow is mildly enlarged but stable. Postsurgical changes are again seen and stable. Increasing right-sided pleural effusion is noted. Underlying edema is seen. Small  left effusion is noted as well. No focal infiltrate is noted. IMPRESSION: Increasing effusions right greater than left. Mild underlying edema is noted. Electronically Signed   By: Inez Catalina M.D.   On: 03/22/2020 09:27   DG Chest Portable 1 View  Result Date: 03/19/2020 CLINICAL DATA:  Shortness of breath EXAM: PORTABLE CHEST 1 VIEW COMPARISON:  02/22/2020 FINDINGS: Cardiac shadow is enlarged but stable. Postsurgical changes  are again seen. Aortic calcifications are noted. The lungs are well aerated bilaterally. Patchy right basilar infiltrate is seen. Some mild interstitial changes are noted which may represent mild edema. No bony abnormality is seen. IMPRESSION: Changes of mild edema and new right basilar infiltrate. Electronically Signed   By: Inez Catalina M.D.   On: 03/19/2020 17:09   DG Abd Portable 1V  Result Date: 03/25/2020 CLINICAL DATA:  Abdominal distension EXAM: PORTABLE ABDOMEN - 1 VIEW COMPARISON:  03/20/2020 FINDINGS: 2 supine frontal views of the abdomen and pelvis demonstrate an unremarkable bowel gas pattern. No evidence of obstruction or ileus. No masses or abnormal calcifications. Stable atherosclerosis. The lung bases are clear. IMPRESSION: 1. Unremarkable bowel gas pattern. Electronically Signed   By: Randa Ngo M.D.   On: 03/25/2020 18:46   ECHOCARDIOGRAM LIMITED  Result Date: 03/20/2020    ECHOCARDIOGRAM LIMITED REPORT   Patient Name:   FAWNA CRANMER Date of Exam: 03/20/2020 Medical Rec #:  [id number]         Height:       60.0 in Accession #:    [id number]        Weight:       118.2 lb Date of Birth:  1945-07-21         BSA:          1.493 m Patient Age:    30 years          BP:           142/67 mmHg Patient Gender: F                 HR:           100 bpm. Exam Location:  Inpatient Procedure: 2D Echo, Cardiac Doppler, Color Doppler and Intracardiac            Opacification Agent Indications:    Cardiomyopathy-Unspecified 425.9 / I42.9  History:        Patient has prior history of Echocardiogram examinations, most                 recent 02/23/2020. Signs/Symptoms:Chest Pain; Risk                 Factors:Hypertension, Diabetes, Dyslipidemia and Non-Smoker.                 GERD.  Sonographer:    Vickie Epley RDCS Referring Phys: [id number] [place]  1. Left ventricular ejection fraction, by estimation, is 60 to 65%. The left ventricle has normal function. The left ventricle has no  regional wall motion abnormalities. Left ventricular diastolic parameters are consistent with Grade II diastolic dysfunction (pseudonormalization).  2. Right ventricular systolic function is normal. The right ventricular size is normal. There is mildly elevated pulmonary artery systolic pressure. The estimated right ventricular systolic pressure is 96.2 mmHg.  3. Left atrial size was mildly dilated.  4. The mitral valve is grossly normal. Trivial mitral valve regurgitation.  5. The aortic valve is tricuspid. There is moderate calcification of the aortic valve. Aortic valve regurgitation is  not visualized. Mild to moderate aortic valve sclerosis/calcification is present, without any evidence of aortic stenosis.  6. The inferior vena cava is normal in size with greater than 50% respiratory variability, suggesting right atrial pressure of 3 mmHg. FINDINGS  Left Ventricle: Left ventricular ejection fraction, by estimation, is 60 to 65%. The left ventricle has normal function. The left ventricle has no regional wall motion abnormalities. Definity contrast agent was given IV to delineate the left ventricular  endocardial borders. The left ventricular internal cavity size was normal in size. There is no left ventricular hypertrophy. Left ventricular diastolic parameters are consistent with Grade II diastolic dysfunction (pseudonormalization). Right Ventricle: The right ventricular size is normal. No increase in right ventricular wall thickness. Right ventricular systolic function is normal. There is mildly elevated pulmonary artery systolic pressure. The tricuspid regurgitant velocity is 2.91  m/s, and with an assumed right atrial pressure of 3 mmHg, the estimated right ventricular systolic pressure is 16.6 mmHg. Left Atrium: Left atrial size was mildly dilated. Right Atrium: Right atrial size was normal in size. Mitral Valve: The mitral valve is grossly normal. Mild mitral annular calcification. Trivial mitral valve  regurgitation. Aortic Valve: The aortic valve is tricuspid. There is moderate calcification of the aortic valve. There is mild aortic valve annular calcification. Aortic valve regurgitation is not visualized. Mild to moderate aortic valve sclerosis/calcification is present, without any evidence of aortic stenosis. Pulmonic Valve: The pulmonic valve was grossly normal. Pulmonic valve regurgitation is trivial. Aorta: The aortic root is normal in size and structure. Venous: The inferior vena cava is normal in size with greater than 50% respiratory variability, suggesting right atrial pressure of 3 mmHg. IAS/Shunts: No atrial level shunt detected by color flow Doppler. LEFT VENTRICLE PLAX 2D LVIDd:         4.50 cm     Diastology LVIDs:         3.00 cm     LV e' medial:    3.83 cm/s LV PW:         0.80 cm     LV E/e' medial:  24.5 LV IVS:        0.80 cm     LV e' lateral:   6.90 cm/s LVOT diam:     2.00 cm     LV E/e' lateral: 13.6 LV SV:         68 LV SV Index:   46 LVOT Area:     3.14 cm  LV Volumes (MOD) LV vol d, MOD A2C: 68.2 ml LV vol d, MOD A4C: 59.3 ml LV vol s, MOD A2C: 19.2 ml LV vol s, MOD A4C: 20.2 ml LV SV MOD A2C:     49.0 ml LV SV MOD A4C:     59.3 ml LV SV MOD BP:      45.6 ml RIGHT VENTRICLE RV S prime:     7.35 cm/s TAPSE (M-mode): 1.6 cm LEFT ATRIUM         Index LA diam:    4.10 cm 2.75 cm/m  AORTIC VALVE LVOT Vmax:   120.00 cm/s LVOT Vmean:  87.600 cm/s LVOT VTI:    0.217 m  AORTA Ao Root diam: 3.10 cm MITRAL VALVE               TRICUSPID VALVE MV Area (PHT): 6.17 cm    TR Peak grad:   33.9 mmHg MV Decel Time: 123 msec    TR Vmax:  291.00 cm/s MV E velocity: 93.80 cm/s MV A velocity: 92.50 cm/s  SHUNTS MV E/A ratio:  1.01        Systemic VTI:  0.22 m                            Systemic Diam: 2.00 cm Rozann Lesches MD Electronically signed by Rozann Lesches MD Signature Date/Time: 03/20/2020/1:10:44 PM    Final    US Abdomen Limited RUQ (LIVER/GB)  Result Date: 03/19/2020 CLINICAL DATA:   Elevated LFTs EXAM: ULTRASOUND ABDOMEN LIMITED RIGHT UPPER QUADRANT COMPARISON:  None. FINDINGS: Gallbladder: The gallbladder is not well visualized however there appears to be diffusely thickened gallbladder wall measuring 1.6 cm. There is a calculi present measuring 1.1 cm. Small amount of pericholecystic fluid and gallbladder sludge is present. Common bile duct: Diameter: 4.2 mm Liver: No focal lesion identified. Within normal limits in parenchymal echogenicity. Portal vein is patent on color Doppler imaging with normal direction of blood flow towards the liver. Other: None. IMPRESSION: Findings suggestive of acute cholecystitis. Electronically Signed   By: Prudencio Pair M.D.   On: 03/19/2020 22:26    Microbiology: Recent Results (from the past 240 hour(s))  Respiratory Panel by RT PCR (Flu A&B, Covid) - Nasopharyngeal Swab     Status: None   Collection Time: 03/19/20  9:37 PM   Specimen: Nasopharyngeal Swab  Result Value Ref Range Status   SARS Coronavirus 2 by RT PCR NEGATIVE NEGATIVE Final    Comment: (NOTE) SARS-CoV-2 target nucleic acids are NOT DETECTED.  The SARS-CoV-2 RNA is generally detectable in upper respiratoy specimens during the acute phase of infection. The lowest concentration of SARS-CoV-2 viral copies this assay can detect is 131 copies/mL. A negative result does not preclude SARS-Cov-2 infection and should not be used as the sole basis for treatment or other patient management decisions. A negative result may occur with  improper specimen collection/handling, submission of specimen other than nasopharyngeal swab, presence of viral mutation(s) within the areas targeted by this assay, and inadequate number of viral copies (<131 copies/mL). A negative result must be combined with clinical observations, patient history, and epidemiological information. The expected result is Negative.  Fact Sheet for Patients:  PinkCheek.be  Fact Sheet for  Healthcare Providers:  GravelBags.it  This test is no t yet approved or cleared by the [place] FDA and  has been authorized for detection and/or diagnosis of SARS-CoV-2 by FDA under an Emergency Use Authorization (EUA). This EUA will remain  in effect (meaning this test can be used) for the duration of the COVID-19 declaration under Section 564(b)(1) of the Act, 21 U.S.C. section 360bbb-3(b)(1), unless the authorization is terminated or revoked sooner.     Influenza A by PCR NEGATIVE NEGATIVE Final   Influenza B by PCR NEGATIVE NEGATIVE Final    Comment: (NOTE) The Xpert Xpress SARS-CoV-2/FLU/RSV assay is intended as an aid in  the diagnosis of influenza from Nasopharyngeal swab specimens and  should not be used as a sole basis for treatment. Nasal washings and  aspirates are unacceptable for Xpert Xpress SARS-CoV-2/FLU/RSV  testing.  Fact Sheet for Patients: PinkCheek.be  Fact Sheet for Healthcare Providers: GravelBags.it  This test is not yet approved or cleared by the [place] FDA and  has been authorized for detection and/or diagnosis of SARS-CoV-2 by  FDA under an Emergency Use Authorization (EUA). This EUA will remain  in effect (meaning this test can be used)  for the duration of the  Covid-19 declaration under Section 564(b)(1) of the Act, 21  U.S.C. section 360bbb-3(b)(1), unless the authorization is  terminated or revoked. Performed at [facility], [place] [address] [place], [place] [address]   Culture, blood (Routine X 2) w Reflex to ID Panel     Status: None   Collection Time: 03/19/20  9:37 PM   Specimen: BLOOD  Result Value Ref Range Status   Specimen Description BLOOD SITE NOT SPECIFIED  Final   Special Requests   Final    BOTTLES DRAWN AEROBIC AND ANAEROBIC Blood Culture adequate volume   Culture   Final    NO GROWTH 5 DAYS Performed at [place], [address], [place], [place] [address]    Report Status 03/24/2020 FINAL  Final  Culture, blood (Routine X 2) w Reflex to ID Panel     Status: None   Collection Time: 03/19/20  9:37 PM   Specimen: BLOOD  Result Value Ref Range Status   Specimen Description BLOOD SITE NOT SPECIFIED  Final   Special Requests   Final    BOTTLES DRAWN AEROBIC AND ANAEROBIC Blood Culture adequate volume   Culture   Final    NO GROWTH 5 DAYS Performed at [place] [address] [place], [place] [address]    Report Status 03/24/2020 FINAL  Final  MRSA PCR Screening     Status: None   Collection Time: 03/21/20  9:55 AM   Specimen: Nasopharyngeal  Result Value Ref Range Status   MRSA by PCR NEGATIVE NEGATIVE Final    Comment:        The GeneXpert MRSA Assay (FDA approved for NASAL specimens only), is one component of a comprehensive MRSA colonization surveillance program. It is not intended to diagnose MRSA infection nor to guide or monitor treatment for MRSA infections. Performed at [facility], [place] [address] [place], [place] [address]      Labs: Basic Metabolic Panel: Recent Labs  Lab 03/20/20 0340 03/20/20 1437 03/21/20 0242 03/21/20 1033 03/22/20 0130 03/23/20 0131 03/24/20 0316 03/26/20 0202  NA 120*   < >  --  129* 132* 136 137 136  K 4.5   < >  --  3.3* 3.8 3.3* 3.4* 3.9  CL 85*   < >  --  90* 94* 98 99 103  CO2 19*   < >  --  22 17* 24 24 23   GLUCOSE 318*   < >  --  144* 123* 229* 154* 205*  BUN 25*   < >  --  29* 31* 26* 22 33*  CREATININE 1.64*   < >  --  2.35* 2.60* 2.09* 1.48* 1.62*  CALCIUM 9.0   < >  --  9.8 9.6 9.8 9.9 9.9  MG 1.7  --  2.6*  --  2.4 2.3 2.0  --   PHOS 3.7  --  4.2  --  4.3 3.9 2.9  --    < > = values in this interval not displayed.   Liver Function Tests: Recent Labs  Lab 03/19/20 1759 03/20/20 1816 03/24/20 0316  AST 262* 117* 30  ALT 247* 151* 82*  ALKPHOS 99 75 67  BILITOT 1.1 1.3* 0.9  PROT 7.5 7.8 7.8  ALBUMIN  4.2 4.9 3.9   Recent Labs  Lab 03/19/20 2046  LIPASE 94*   Recent Labs  Lab 03/19/20 2046  AMMONIA 31   CBC:  Recent Labs  Lab 03/19/20 1759 03/19/20 1759 03/20/20 0340 03/21/20 0242 03/22/20 0130 03/23/20 0131 03/24/20 0316  WBC 7.4   < > 6.0 8.7 9.5 5.5 4.0  NEUTROABS 6.0  --   --   --   --   --   --   HGB 9.6*   < > 8.0* 8.0* 8.8* 9.0* 9.7*  HCT 29.5*   < > 23.7* 23.1* 27.1* 27.4* 29.8*  MCV 79.3*   < > 75.7* 75.5* 79.0* 78.7* 79.5*  PLT 199   < > 157 166 178 195 225   < > = values in this interval not displayed.   Cardiac Enzymes: No results for input(s): CKTOTAL, CKMB, CKMBINDEX, TROPONINI in the last 168 hours. BNP: BNP (last 3 results) Recent Labs    03/20/20 0007  BNP 1,085.8*    ProBNP (last 3 results) No results for input(s): PROBNP in the last 8760 hours.  CBG: Recent Labs  Lab 03/25/20 0634 03/25/20 1119 03/25/20 1636 03/25/20 2146 03/26/20 0636  GLUCAP 207* 240* 313* 195* 197*       Signed:  Domenic Polite MD.  [facility] 03/26/2020, 11:15 AM

## 2020-03-26 NOTE — Progress Notes (Signed)
DISCHARGE NOTE HOME  Amanda [name] to be discharged Home per MD order. Discussed prescriptions and follow up appointments with the patient. Prescriptions given to patient; medication list explained in detail. Patient verbalized understanding.  Skin clean, dry and intact without evidence of skin break down, no evidence of skin tears noted. IV catheter discontinued intact. Site without signs and symptoms of complications. Dressing and pressure applied. Pt denies pain at the site currently. No complaints noted.  Patient free of lines, drains, and wounds.   An After Visit Summary (AVS) was printed and given to the patient's son. [name] Patient escorted via wheelchair, and discharged home via private auto.  Beatris Ship, RN

## 2020-04-16 ENCOUNTER — Telehealth: Payer: Self-pay

## 2020-04-16 NOTE — Telephone Encounter (Signed)
NOTES [place], SENT REFERRAL TO [place]

## 2020-06-01 ENCOUNTER — Ambulatory Visit: Payer: Medicare Other | Admitting: Cardiovascular Disease

## 2020-06-03 ENCOUNTER — Other Ambulatory Visit: Payer: Self-pay | Admitting: Internal Medicine

## 2020-06-04 LAB — LIPID PANEL
Cholesterol: 274 mg/dL — ABNORMAL HIGH (ref <200)
HDL: 67 mg/dL (ref >=50)
LDL Cholesterol (Calc): 179 mg/dL (calc) — ABNORMAL HIGH
Non-HDL Cholesterol (Calc): 207 mg/dL (calc) — ABNORMAL HIGH (ref <130)
Total CHOL/HDL Ratio: 4.1 (calc) (ref <5.0)
Triglycerides: 141 mg/dL (ref <150)

## 2020-06-04 LAB — CBC
HCT: 36 % (ref 35.0–45.0)
Hemoglobin: 12.1 g/dL (ref 11.7–15.5)
MCH: 25.4 pg — ABNORMAL LOW (ref 27.0–33.0)
MCHC: 33.6 g/dL (ref 32.0–36.0)
MCV: 75.6 fL — ABNORMAL LOW (ref 80.0–100.0)
MPV: 9.4 fL (ref 7.5–12.5)
Platelets: 264 10*3/uL (ref 140–400)
RBC: 4.76 10*6/uL (ref 3.80–5.10)
RDW: 13.4 % (ref 11.0–15.0)
WBC: 5.9 10*3/uL (ref 3.8–10.8)

## 2020-06-04 LAB — TSH: TSH: 1.78 mIU/L (ref 0.40–4.50)

## 2020-06-22 ENCOUNTER — Ambulatory Visit: Payer: Medicare Other | Admitting: Cardiovascular Disease

## 2020-07-13 ENCOUNTER — Other Ambulatory Visit: Payer: Self-pay

## 2020-07-13 ENCOUNTER — Ambulatory Visit (INDEPENDENT_AMBULATORY_CARE_PROVIDER_SITE_OTHER): Payer: Medicare Other | Admitting: Cardiovascular Disease

## 2020-07-13 VITALS — BP 140/60 | HR 61 | Ht <= 58 in | Wt 115.2 lb

## 2020-07-13 DIAGNOSIS — I739 Peripheral vascular disease, unspecified: Secondary | ICD-10-CM

## 2020-07-13 DIAGNOSIS — I2510 Atherosclerotic heart disease of native coronary artery without angina pectoris: Secondary | ICD-10-CM | POA: Diagnosis not present

## 2020-07-13 DIAGNOSIS — E785 Hyperlipidemia, unspecified: Secondary | ICD-10-CM | POA: Diagnosis not present

## 2020-07-13 DIAGNOSIS — I10 Essential (primary) hypertension: Secondary | ICD-10-CM | POA: Diagnosis not present

## 2020-07-13 MED ORDER — ROSUVASTATIN CALCIUM 40 MG PO TABS: 40.0000 mg | ORAL_TABLET | Freq: Every day | ORAL | 3 refills | Status: AC

## 2020-07-13 NOTE — Progress Notes (Addendum)
Cardiology Office Note   Date:  07/13/2020   ID:  Amanda [name], DOB 1945/09/16, MRN [id number]  PCP:  [name], MD  Cardiologist:   Kathlyn Sacramento, MD   No chief complaint on file.     History of Present Illness: Amanda [name] is a 75 y.o. female who is here today for follow-up visit regarding peripheral arterial disease. She has known history of coronary artery disease status post CABG in 2013, type 2 diabetes, stage III chronic kidney disease, hypertension and hyperlipidemia.  She is originally from [place] and does not speak [place].  Her son is a previous professor at [employer] and he helps with interpretation.    She was seen in 2024 severe bilateral leg claudication likely with rest pain on the left side.  Noninvasive vascular studies at that time showed an ABI of 0.6 on the right and 0.56 on the left. Duplex showed short occlusion of below the knee popliteal artery on the right side with occluded tibial peroneal vessels.  On the left, there was significant disease affecting above-the-knee popliteal artery with occluded below the knee vessels.  I advised the patient to undergo an angiogram but she declined.  She was treated with cilostazol.   The patient was hospitalized in October with chest pain, 2 infection and hyponatremia.  Hydrochlorothiazide was discontinued.  She had an echocardiogram which showed normal LV systolic function. She was hospitalized in November with acute hypoxic respiratory failure and abdominal pain.  She was found to be hyponatremic with acute kidney injury and lactic acidosis.  Her blood sugar was severely elevated.  She was admitted to the ICU as she required BiPAP.  She had significant lactic acidosis.  She was treated for possible pneumonia.  She returns for follow-up and has not been seen by me in 2 years.  She reports that she stopped taking cilostazol because it made her feel like she was drunk.  In spite of that, she does not  report any worsening leg pain.  She has no lower extremity ulceration.  No recurrent episodes of chest pain or shortness of breath.  Past Medical History:  Diagnosis Date  . Abdominal pain   . Chest pain    Atypical  . Diabetes mellitus   . GERD (gastroesophageal reflux disease)   . Hyperlipidemia   . Hypertension     Past Surgical History:  Procedure Laterality Date  . CARDIAC SURGERY    . 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 CORONARY ARTERY BYPASS GRAFT  10/05/2011   Procedure: CORONARY ARTERY BYPASS GRAFTING (CABG);  Surgeon: Ivin Poot, MD;  Location: [facility];  Service: Open Heart Surgery;  Laterality: N/A;  TEE  . LEFT HEART CATHETERIZATION WITH CORONARY ANGIOGRAM N/A 10/04/2011   Procedure: LEFT HEART CATHETERIZATION WITH CORONARY ANGIOGRAM;  Surgeon: Sherren Mocha, MD;  Location: [facility] CATH LAB;  Service: Cardiovascular;  Laterality: N/A;     Current Outpatient Medications  Medication Sig Dispense Refill  . amLODipine (NORVASC) 10 MG tablet Take 10 mg by mouth daily.    Marland Kitchen antiseptic oral rinse (BIOTENE) LIQD 15 mLs by Mouth Rinse route as needed for dry mouth. 237 mL 0  . aspirin EC 81 MG tablet Take 81 mg by mouth daily. Swallow whole.    . diclofenac Sodium (VOLTAREN) 1 % GEL Apply 1 application topically 4 (four) times daily as needed (pain).     . furosemide (LASIX) 20 MG tablet Take 1 tablet (20 mg total) by mouth  daily. 30 tablet 0  . glipiZIDE (GLUCOTROL) 10 MG tablet Take 10 mg by mouth daily.   1  . metFORMIN (GLUCOPHAGE) 500 MG tablet Take 500 mg by mouth 2 (two) times daily.    . metoprolol tartrate (LOPRESSOR) 25 MG tablet Take 0.5 tablets (12.5 mg total) by mouth 2 (two) times daily. 60 tablet 0  . pantoprazole (PROTONIX) 40 MG tablet Take 1 tablet (40 mg total) by mouth daily. 30 tablet 0  . phenol (CHLORASEPTIC) 1.4 % LIQD Use as directed 1 spray in the mouth or throat as needed for throat irritation / pain. 325 mL 0  . traMADol (ULTRAM) 50 MG tablet  Take 1 tablet (50 mg total) by mouth every 6 (six) hours as needed for moderate pain. (Patient taking differently: Take 25-50 mg by mouth every 6 (six) hours as needed for moderate pain.) 30 tablet 0  . JARDIANCE 10 MG TABS tablet Take 10 mg by mouth daily.     No current facility-administered medications for this visit.    Allergies:   Patient has no known allergies.    Social History:  The patient  reports that she has never smoked. She has never used smokeless tobacco. She reports that she does not drink alcohol and does not use drugs.   Family History:  The patient's family history is not known by the patient or son.   ROS:  Please see the history of present illness.   Otherwise, review of systems are positive for none.   All other systems are reviewed and negative.    PHYSICAL EXAM: VS:  BP 140/60   Pulse 61   Ht '4\' 9"'$  (1.448 m)   Wt 115 lb 3.2 oz (52.3 kg)   BMI 24.93 kg/m  , BMI Body mass index is 24.93 kg/m. GEN: Well nourished, well developed, in no acute distress  HEENT: normal  Neck: no JVD, carotid bruits, or masses Cardiac: RRR; no rubs, or gallops,no edema .  2 out of 6 systolic murmur in the aortic area Respiratory:  clear to auscultation bilaterally, normal work of breathing GI: soft, nontender, nondistended, + BS MS: no deformity or atrophy  Skin: warm and dry, no rash Neuro:  Strength and sensation are intact Psych: euthymic mood, full affect Vascular: Radial pulses normal bilaterally.  Femoral pulses +1 bilaterally.  Distal pulses are nonpalpable.   EKG:  EKG is ordered today.   Recent Labs: 03/20/2020: B Natriuretic Peptide 1,085.8 03/24/2020: ALT 82; Magnesium 2.0 03/26/2020: BUN 33; Creatinine, Ser 1.62; Potassium 3.9; Sodium 136 06/03/2020: Hemoglobin 12.1; Platelets 264; TSH 1.78    Lipid Panel    Component Value Date/Time   CHOL 274 (H) 06/03/2020 1720   TRIG 141 06/03/2020 1720   HDL 67 06/03/2020 1720   CHOLHDL 4.1 06/03/2020 1720   VLDL  29 01/24/2010 2316   LDLCALC 179 (H) 06/03/2020 1720      Wt Readings from Last 3 Encounters:  07/13/20 115 lb 3.2 oz (52.3 kg)  03/24/20 114 lb 3.2 oz (51.8 kg)  02/23/20 116 lb 13.5 oz (53 kg)      No flowsheet data found.    ASSESSMENT AND PLAN:  1.  Peripheral arterial disease with bilateral leg claudication: She reports that she does not have pain most of the time and has minimal symptoms at the present time even after she stopped cilostazol.  Continue low-dose aspirin.  Small dose Xarelto 2.5 mg twice daily can be considered once we make sure that she  will keep her follow-up appointment.  2.  Coronary artery disease involving native coronary arteries without angina: She reports no chest pain at the present time.  3.  Essential hypertension: Blood pressures reasonably controlled on current medications.  4.  Hyperlipidemia: She is on pravastatin 80 mg daily.  Recent lipid profile showed an LDL of 179.  Due to that, I elected to switch pravastatin to rosuvastatin 40 mg daily to try to get her LDL below 70.  Repeat lipid and liver profile in 2 months and will consider adding Zetia if needed.    Disposition:   FU with me in 6 months  Signed,  Kathlyn Sacramento, MD  07/13/2020 8:57 AM    [place]

## 2020-07-13 NOTE — Patient Instructions (Signed)
Medication Instructions:  STOP the Pravastatin  START Rosuvastatin 40 mg once daily  *If you need a refill on your cardiac medications before your next appointment, please call your pharmacy*   Lab Work: Your provider would like for you to return in 2 months to have the following labs drawn: fasting Lipid and Liver. You do not need an appointment for the lab. Once in our office lobby there is a podium where you can sign in and ring the doorbell to alert [place] that you are here. The lab is open from 8:00 am to 4:30 pm; closed for lunch from 12:45pm-1:45pm.  If you have labs (blood work) drawn today and your tests are completely normal, you will receive your results only by: Marland Kitchen MyChart Message (if you have MyChart) OR . A paper copy in the mail If you have any lab test that is abnormal or we need to change your treatment, we will call you to review the results.   Testing/Procedures: None ordered   Follow-Up: At [facility], you and your health needs are our priority.  As part of our continuing mission to provide you with exceptional heart care, we have created designated Provider Care Teams.  These Care Teams include your primary Cardiologist (physician) and Advanced Practice Providers (APPs -  Physician Assistants and Nurse Practitioners) who all work together to provide you with the care you need, when you need it.  We recommend signing up for the patient portal called "MyChart".  Sign up information is provided on this After Visit Summary.  MyChart is used to connect with patients for Virtual Visits (Telemedicine).  Patients are able to view lab/test results, encounter notes, upcoming appointments, etc.  Non-urgent messages can be sent to your provider as well.   To learn more about what you can do with MyChart, go to [email or web address].    Your next appointment:   6 month(s)  The format for your next appointment:   In Person  Provider:   Kathlyn Sacramento, MD

## 2021-01-31 ENCOUNTER — Other Ambulatory Visit: Payer: Self-pay | Admitting: Internal Medicine

## 2021-02-01 LAB — BASIC METABOLIC PANEL WITH GFR
BUN/Creatinine Ratio: 13 (calc) (ref 6–22)
BUN: 26 mg/dL — ABNORMAL HIGH (ref 7–25)
CO2: 17 mmol/L — ABNORMAL LOW (ref 20–32)
Calcium: 9.9 mg/dL (ref 8.6–10.4)
Chloride: 97 mmol/L — ABNORMAL LOW (ref 98–110)
Creat: 1.95 mg/dL — ABNORMAL HIGH (ref 0.60–1.00)
Glucose, Bld: 534 mg/dL (ref 65–99)
Potassium: 4.4 mmol/L (ref 3.5–5.3)
Sodium: 132 mmol/L — ABNORMAL LOW (ref 135–146)
eGFR: 26 mL/min/{1.73_m2} — ABNORMAL LOW (ref >=60)

## 2021-02-01 LAB — EXTRA LAV TOP TUBE

## 2021-05-03 ENCOUNTER — Other Ambulatory Visit: Payer: Self-pay | Admitting: Internal Medicine

## 2021-05-03 LAB — BASIC METABOLIC PANEL WITH GFR
BUN/Creatinine Ratio: 16 (calc) (ref 6–22)
BUN: 35 mg/dL — ABNORMAL HIGH (ref 7–25)
CO2: 20 mmol/L (ref 20–32)
Calcium: 9.8 mg/dL (ref 8.6–10.4)
Chloride: 103 mmol/L (ref 98–110)
Creat: 2.25 mg/dL — ABNORMAL HIGH (ref 0.60–1.00)
Glucose, Bld: 168 mg/dL — ABNORMAL HIGH (ref 65–99)
Potassium: 4.1 mmol/L (ref 3.5–5.3)
Sodium: 136 mmol/L (ref 135–146)
eGFR: 22 mL/min/{1.73_m2} — ABNORMAL LOW (ref >=60)

## 2021-06-09 ENCOUNTER — Other Ambulatory Visit: Payer: Self-pay | Admitting: Nephrology

## 2021-06-09 DIAGNOSIS — E1122 Type 2 diabetes mellitus with diabetic chronic kidney disease: Secondary | ICD-10-CM

## 2021-06-09 DIAGNOSIS — I129 Hypertensive chronic kidney disease with stage 1 through stage 4 chronic kidney disease, or unspecified chronic kidney disease: Secondary | ICD-10-CM

## 2021-06-09 DIAGNOSIS — I5030 Unspecified diastolic (congestive) heart failure: Secondary | ICD-10-CM

## 2021-06-09 DIAGNOSIS — N184 Chronic kidney disease, stage 4 (severe): Secondary | ICD-10-CM

## 2021-06-21 ENCOUNTER — Ambulatory Visit
Admission: RE | Admit: 2021-06-21 | Discharge: 2021-06-21 | Disposition: A | Discharge status: Home / Self Care | Payer: Medicare Other | Source: Ambulatory Visit | Attending: Nephrology | Admitting: Nephrology

## 2021-06-21 DIAGNOSIS — N184 Chronic kidney disease, stage 4 (severe): Secondary | ICD-10-CM

## 2021-06-21 DIAGNOSIS — E1122 Type 2 diabetes mellitus with diabetic chronic kidney disease: Secondary | ICD-10-CM

## 2021-06-21 DIAGNOSIS — I129 Hypertensive chronic kidney disease with stage 1 through stage 4 chronic kidney disease, or unspecified chronic kidney disease: Secondary | ICD-10-CM

## 2021-06-21 DIAGNOSIS — I5030 Unspecified diastolic (congestive) heart failure: Secondary | ICD-10-CM

## 2021-06-23 ENCOUNTER — Encounter: Payer: Self-pay | Admitting: Podiatrist

## 2021-06-23 ENCOUNTER — Ambulatory Visit (INDEPENDENT_AMBULATORY_CARE_PROVIDER_SITE_OTHER): Payer: Medicare Other | Admitting: Podiatrist

## 2021-06-23 ENCOUNTER — Ambulatory Visit (INDEPENDENT_AMBULATORY_CARE_PROVIDER_SITE_OTHER): Payer: Medicare Other

## 2021-06-23 ENCOUNTER — Other Ambulatory Visit: Payer: Self-pay

## 2021-06-23 DIAGNOSIS — L97521 Non-pressure chronic ulcer of other part of left foot limited to breakdown of skin: Secondary | ICD-10-CM

## 2021-06-23 DIAGNOSIS — L603 Nail dystrophy: Secondary | ICD-10-CM | POA: Diagnosis not present

## 2021-06-23 DIAGNOSIS — E119 Type 2 diabetes mellitus without complications: Secondary | ICD-10-CM | POA: Diagnosis not present

## 2021-06-23 DIAGNOSIS — I739 Peripheral vascular disease, unspecified: Secondary | ICD-10-CM | POA: Diagnosis not present

## 2021-06-23 DIAGNOSIS — R234 Changes in skin texture: Secondary | ICD-10-CM

## 2021-06-23 MED ORDER — MUPIROCIN 2 % EX OINT: 1.0000 "application " | TOPICAL_OINTMENT | Freq: Two times a day (BID) | CUTANEOUS | 2 refills | Status: AC

## 2021-06-23 NOTE — Progress Notes (Signed)
Chief Complaint  Patient presents with   Diabetes    Diabetic foot exam      HPI: Patient is 76 y.o. female who presents today with her son for pain in her left second toe where the toenail came off as well as a sore bite on the lateral aspect of the left heel as well as a diabetic foot examination.  She is known to have diabetes with peripheral arterial disease.  Her last circulation study was done in 2019.    Patient Active Problem List   Diagnosis Date Noted   Language barrier to communication 03/24/2020   Bilateral pleural effusion 03/24/2020   AKI (acute kidney injury) ([place]) 03/24/2020   Microcytic anemia 03/24/2020   Aortic atherosclerosis ([place]) 03/24/2020   Abdominal distension    Acute respiratory failure with hypoxia ([place]) 03/19/2020   Hyponatremia 02/22/2020   S/P CABG x 3 10/13/2011   CAD (coronary artery disease) 10/04/2011   Type 2 diabetes mellitus ([place]) 10/04/2011   Chest pain 81/19/1478   HELICOBACTER PYLORI INFECTION 01/25/2009   DIABETIC FOOT ULCER 01/22/2009   GERD 12/30/2008   MURMUR 11/26/2008   URINARY HESITANCY 11/26/2008   HYPERCHOLESTEROLEMIA 09/14/2008   Hypertension 09/14/2008   CONSTIPATION 06/08/2008   PRURITUS 05/21/2008   DRY SKIN 05/21/2008   DEGENERATIVE DISC DISEASE, 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     antiseptic oral rinse (BIOTENE) LIQD 15 mLs by Mouth Rinse route as needed for dry mouth. 237 mL 0   aspirin EC 81 MG tablet Take 81 mg by mouth daily. Swallow whole.     diclofenac Sodium (VOLTAREN) 1 % GEL Apply 1 application topically 4 (four) times daily as needed (pain).      furosemide (LASIX) 20 MG tablet Take 1 tablet (20 mg total) by mouth daily. 30 tablet 0   glipiZIDE (GLUCOTROL) 10 MG tablet Take 10 mg by mouth daily.   1   JARDIANCE 10 MG TABS tablet Take 10 mg by mouth daily.      metFORMIN (GLUCOPHAGE) 500 MG tablet Take 500 mg by mouth 2 (two) times daily.     metoprolol tartrate (LOPRESSOR) 25 MG tablet Take 0.5 tablets (12.5 mg total) by mouth 2 (two) times daily. 60 tablet 0   pantoprazole (PROTONIX) 40 MG tablet Take 1 tablet (40 mg total) by mouth daily. 30 tablet 0   phenol (CHLORASEPTIC) 1.4 % LIQD Use as directed 1 spray in the mouth or throat as needed for throat irritation / pain. 325 mL 0   rosuvastatin (CRESTOR) 40 MG tablet Take 1 tablet (40 mg total) by mouth daily. 90 tablet 3   traMADol (ULTRAM) 50 MG tablet Take 1 tablet (50 mg total) by mouth every 6 (six) hours as needed for moderate pain. (Patient taking differently: Take 25-50 mg by mouth every 6 (six) hours as needed for moderate pain.) 30 tablet 0   No current facility-administered medications on file prior to visit.    No Known Allergies  Review of Systems No fevers, chills, nausea, muscle aches, no difficulty breathing, no calf pain, no chest pain or shortness of breath.   Physical Exam  GENERAL APPEARANCE: Alert, conversant. Appropriately groomed. No acute distress.   VASCULAR: Pedal pulses non palpable  DP and PT bilateral.  Capillary refill time is delayed to all digits,  Proximal to distal cooling  is cool to cool.  Digital hair growth absent.   NEUROLOGIC: sensation is intact to 5.07 monofilament at 3 /5 sites bilateral.  Light touch is intact bilaterall  MUSCULOSKELETAL: acceptable muscle strength, tone and stability bilateral.  Mild digital contractures noted bilateral.    DERMATOLOGIC:  skin is thin, shiny and atrophic.  The left second toenail has yellow slough on the nailbed.  No probing to bone is noted no undermining is seen.  No redness, no swelling, no drainage is noted.  Superficial skin fissure is also present on the lateral left heel.  No sign of infection is noted.  Remainder of the toenails are thickened, discolored, dystrophic and clinically mycotic x9  X-ray  evaluation 3 views of the left foot are obtained to rule out osteomyelitis of the left second toe.  The distal tuft of the distal phalanx does appear to be intact with no cortical disruptions noted.  Remainder of the x-ray is unremarkable.  No acute osseous abnormalities are seen.  Assessment     ICD-10-CM   1. Ulcer of left second toe, limited to breakdown of skin ([place])  L97.521     2. Type 2 diabetes mellitus without complication, unspecified whether long term insulin use ([facility])  E11.9 DG Foot Complete Left    3. Nail dystrophy  L60.3     4. PAD (peripheral artery disease) ([facility])  I73.9     5. Fissure in skin of foot  R23.4         Plan  Discussed exam findings and recommendations with the patient and her son.  I did recommend an x-ray and this does appear negative for bony involvement beneath the second digital nail bed.  I did call in a prescription for mupirocin ointment and recommended use of of this medication on the nailbed as well as the lateral left heel skin fissure.  And to be covered with a Band-Aid.  I also recommended a repeat vascular study as comparison to her study done in 2019 to give probability for healing time of this left second toe.  I will set this up for her.  She would also benefit from some new diabetic shoes the last pair she had were done about 3 years ago.  She will be seen back in 2 week for recheck of the foot and for measuring for diabetic shoes.  If any concerns arise prior to this scheduled visit she is instructed to call.

## 2021-06-23 NOTE — Patient Instructions (Signed)
Cleanse your left foot with soap and water.  Dry well.  Apply the antibiotic ointment (mupirocin) to the left second toe and the outside of the left heel.  Cover with a bandaid  We will order you a study of your circulation- the office will call to set up this appointment   We will also get you going for diabetic shoes.  You will [facility] Edelman [facility] for those.

## 2021-07-04 ENCOUNTER — Other Ambulatory Visit (HOSPITAL_COMMUNITY): Payer: Self-pay | Admitting: Podiatrist

## 2021-07-04 DIAGNOSIS — I739 Peripheral vascular disease, unspecified: Secondary | ICD-10-CM

## 2021-07-07 ENCOUNTER — Other Ambulatory Visit: Payer: Self-pay

## 2021-07-07 ENCOUNTER — Ambulatory Visit (HOSPITAL_COMMUNITY)
Admission: RE | Admit: 2021-07-07 | Discharge: 2021-07-07 | Disposition: A | Discharge status: Home / Self Care | Payer: Medicare Other | Source: Ambulatory Visit | Attending: Internal Medicine | Admitting: Internal Medicine

## 2021-07-07 DIAGNOSIS — L97521 Non-pressure chronic ulcer of other part of left foot limited to breakdown of skin: Secondary | ICD-10-CM | POA: Diagnosis present

## 2021-07-07 DIAGNOSIS — E119 Type 2 diabetes mellitus without complications: Secondary | ICD-10-CM | POA: Diagnosis not present

## 2021-07-07 DIAGNOSIS — I739 Peripheral vascular disease, unspecified: Secondary | ICD-10-CM | POA: Diagnosis present

## 2021-07-12 ENCOUNTER — Telehealth: Payer: Self-pay | Admitting: *Deleted

## 2021-07-12 NOTE — Telephone Encounter (Signed)
-----   Message from Bronson Ing, [place] sent at 07/07/2021  4:11 PM EST ----- Regarding: could you call this patient with results of vasc study (not urgent) Hi Amanda [name]!  Can you call to see how this patient is doing with her toe?  Her vascular study hasn't changed much from the one done in 2019 but does still show decreased blood flow to her feet.  Are you able to see if she has any appointments coming up with podiatry?  (I'm bad at that on here) And if not, if we could make her an appointment to come in for a recheck that would be great.   Thanks so much!!!  Dr. Johnette Abraham

## 2021-07-12 NOTE — Telephone Encounter (Signed)
Called patient, no answer, left vm of results ,to call back to schedule f/u appointment.for a recheck.

## 2021-07-18 ENCOUNTER — Ambulatory Visit: Payer: Medicare Other

## 2021-07-18 ENCOUNTER — Other Ambulatory Visit: Payer: Self-pay

## 2021-07-18 DIAGNOSIS — I739 Peripheral vascular disease, unspecified: Secondary | ICD-10-CM

## 2021-07-18 DIAGNOSIS — E119 Type 2 diabetes mellitus without complications: Secondary | ICD-10-CM

## 2021-07-18 DIAGNOSIS — L97521 Non-pressure chronic ulcer of other part of left foot limited to breakdown of skin: Secondary | ICD-10-CM

## 2021-07-18 NOTE — Progress Notes (Signed)
SITUATION ?Reason for Consult: Evaluation for Prefabricated Diabetic Shoes and Custom Diabetic Inserts. ?Patient / Caregiver Report: Patient would like well fitting shoes ? ?OBJECTIVE DATA: ?Patient History / Diagnosis:  ?  ICD-10-CM   ?1. Type 2 diabetes mellitus without complication, unspecified whether long term insulin use (HCC)  E11.9   ?  ?2. PAD (peripheral artery disease) (HCC)  I73.9   ?  ?3. Ulcer of left second toe, limited to breakdown of skin [facility])  L97.521   ?  ? ? ?Current or Previous Devices:   Historical user ? ?In-Person Foot Examination: ?Ulcers & Callousing:   Historical ? ?Deformities:   ?- None ?  ?Shoe Size: 71M ? ?ORTHOTIC RECOMMENDATION ?Recommended Devices: ?- 1x pair prefabricated PDAC approved diabetic shoes; Patient Selected - Shon Millet Blue 844 Size 71M ?- 3x pair custom-to-patient PDAC approved vacuum formed diabetic insoles. ? ?GOALS OF SHOES AND INSOLES ?- Reduce shear and pressure ?- Reduce / Prevent callus formation ?- Reduce / Prevent ulceration ?- Protect the fragile healing compromised diabetic foot. ? ?Patient would benefit from diabetic shoes and inserts as patient has diabetes mellitus and the patient has one or more of the following conditions: ?- Peripheral neuropathy with evidence of callus formation ?- Foot deformity ?- Poor circulation ? ?ACTIONS PERFORMED ?Patient was casted for insoles via crush box and measured for shoes via brannock device. Procedure was explained and patient tolerated procedure well. All questions were answered and concerns addressed. ? ?PLAN ?Patient is to be contacted and scheduled for fitting once CMN is obtained from treaing physician and shoes and insoles have been fabricated and received. ? ?

## 2021-07-19 ENCOUNTER — Telehealth: Payer: Self-pay

## 2021-07-19 NOTE — Telephone Encounter (Signed)
Casts sent to central fabrication - HOLD for CMN ?

## 2021-07-25 ENCOUNTER — Ambulatory Visit (INDEPENDENT_AMBULATORY_CARE_PROVIDER_SITE_OTHER): Payer: Medicare Other | Admitting: Podiatrist

## 2021-07-25 ENCOUNTER — Other Ambulatory Visit: Payer: Self-pay

## 2021-07-25 DIAGNOSIS — I739 Peripheral vascular disease, unspecified: Secondary | ICD-10-CM | POA: Diagnosis not present

## 2021-07-25 DIAGNOSIS — L97521 Non-pressure chronic ulcer of other part of left foot limited to breakdown of skin: Secondary | ICD-10-CM

## 2021-07-25 DIAGNOSIS — R234 Changes in skin texture: Secondary | ICD-10-CM | POA: Diagnosis not present

## 2021-07-25 MED ORDER — LIDOCAINE-PRILOCAINE 2.5-2.5 % EX CREA: 1.0000 "application " | TOPICAL_CREAM | CUTANEOUS | 3 refills | Status: AC | PRN

## 2021-07-25 MED ORDER — DOXYCYCLINE HYCLATE 100 MG PO TABS: 100.0000 mg | ORAL_TABLET | Freq: Two times a day (BID) | ORAL | 0 refills | Status: AC

## 2021-07-25 NOTE — Progress Notes (Unsigned)
Ulcer left second and third toes.  Refer to vas ular

## 2021-07-28 ENCOUNTER — Encounter: Payer: Self-pay | Admitting: Podiatrist

## 2021-08-15 ENCOUNTER — Ambulatory Visit (INDEPENDENT_AMBULATORY_CARE_PROVIDER_SITE_OTHER): Payer: Medicare Other | Admitting: Podiatrist

## 2021-08-15 DIAGNOSIS — R234 Changes in skin texture: Secondary | ICD-10-CM

## 2021-08-15 DIAGNOSIS — I739 Peripheral vascular disease, unspecified: Secondary | ICD-10-CM | POA: Diagnosis not present

## 2021-08-15 DIAGNOSIS — L97521 Non-pressure chronic ulcer of other part of left foot limited to breakdown of skin: Secondary | ICD-10-CM | POA: Diagnosis not present

## 2021-08-15 NOTE — Progress Notes (Signed)
Chief Complaint  ?Patient presents with  ? Wound Check  ?    Left foot wound check  ?  ?  ?HPI: Patient is 76 y.o. female who presents today for follow up of wound on the left second and third toe as well as continued pain on the lateral heel.   She presents today with her son who is also her interpreter. She is applying mupirocin ointment to the left second and third toe and relates the pain in them is improving.  Her main area of concern today is her left heel- lateral side. ? ?Past Medical History:  ?Diagnosis Date  ? Abdominal pain   ? Chest pain   ? Atypical  ? Diabetes mellitus   ? GERD (gastroesophageal reflux disease)   ? Hyperlipidemia   ? Hypertension   ? No Known Allergies ? ? ?Physical Exam ?  ?GENERAL APPEARANCE: Alert, conversant. Appropriately groomed. No acute distress.  ?  ?VASCULAR: Pedal pulses non palpable  DP and PT bilateral.  Capillary refill time is delayed to all digits,  Proximal to distal cooling is cool to cool.  Digital hair growth absent.  ?  ?NEUROLOGIC: sensation is intact to 5.07 monofilament at 3 /5 sites bilateral.  Light touch is intact bilateral. No clonus noted. Downgoing babinski.  ?  ?MUSCULOSKELETAL: acceptable muscle strength, tone and stability bilateral.  Mild digital contractures noted bilateral.   ?  ?DERMATOLOGIC:  skin is thin, shiny and atrophic.  The left second and third toenails appear stable with intact eschar on the nail beds.  No probing to bone is noted no undermining is seen.  No redness, no swelling, no drainage is noted.  Deep skin fissure is also present on the lateral left heel.  it measures 0.5cm x 0.2cm x 0.3cm in depth. no drainage, no redness is noted. Pain with pressure is noted. No sign of infection is noted. ? ?Assessment & Plan   ?  ICD-10-CM   ?1. PAD (peripheral artery disease) (HCC)  I73.9   ?  ?2. Ulcer of left second toe, limited to breakdown of skin ([place])  L97.521   ?  ?3. Fissure in skin of foot  R23.4   ?  ? ?- discussed exam findings with  the patient and her son and discussed that the toes do appear stable with the dried eschar present.  I discussed they should continue to keep the nail beds moist with vaseline or topical antibacterial ointment. ?- I pared the edges of the skin fissure today with a 15 blade without incident.  I applied polysporin and a silicone bandage.  I will order some silicone bandages for her through [facility] as these are much easier to apply vs gauze and tape.   ?- I will recheck the wounds in 3 weeks. If any concerns arise prior to this visit, they are instructed to call.  ?

## 2021-08-18 ENCOUNTER — Encounter: Payer: Self-pay | Admitting: Podiatrist

## 2021-09-05 ENCOUNTER — Ambulatory Visit (INDEPENDENT_AMBULATORY_CARE_PROVIDER_SITE_OTHER): Payer: Medicare Other | Admitting: Podiatrist

## 2021-09-05 ENCOUNTER — Encounter: Payer: Self-pay | Admitting: Podiatrist

## 2021-09-05 DIAGNOSIS — I739 Peripheral vascular disease, unspecified: Secondary | ICD-10-CM

## 2021-09-05 DIAGNOSIS — R234 Changes in skin texture: Secondary | ICD-10-CM

## 2021-09-05 DIAGNOSIS — L97522 Non-pressure chronic ulcer of other part of left foot with fat layer exposed: Secondary | ICD-10-CM

## 2021-09-05 NOTE — Patient Instructions (Addendum)
Your circulation is causing the wound on the left second toe to be painful and non healing.  Be sure to go to the appointment on Friday to see Dr. Trula Slade to discuss your treatment options.   ? ? ?

## 2021-09-07 NOTE — Progress Notes (Addendum)
? ?HPI: Patient is 76 y.o. female who presents today for follow up of ulcer left second and third distal digits as as well as a painful skin fissure on the lateral side of the heel. She presents today with her son who serves as her interpreter.  She presents wearing flip flop sandals and no dressing on the left foot.  Her son states she has been applying the mupirocin to the toes and lateral heel.  She continues to have pain especially on the lateral heel.  She denies any constitutional signs or symptoms of infection. She has an appointment this week to speak with a vascular specialist about her blood flow concerns.  ? ?Patient Active Problem List  ? Diagnosis Date Noted  ? Language barrier to communication 03/24/2020  ? Bilateral pleural effusion 03/24/2020  ? AKI (acute kidney injury) ([place]) 03/24/2020  ? Microcytic anemia 03/24/2020  ? Aortic atherosclerosis ([place]) 03/24/2020  ? Abdominal distension   ? Acute respiratory failure with hypoxia ([place]) 03/19/2020  ? Hyponatremia 02/22/2020  ? 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 CAD (coronary artery disease) 10/04/2011  ? Type 2 diabetes mellitus ([place]) 10/04/2011  ? Chest pain 09/25/2011  ? HELICOBACTER PYLORI INFECTION 01/25/2009  ? DIABETIC FOOT ULCER 01/22/2009  ? GERD 12/30/2008  ? MURMUR 11/26/2008  ? URINARY HESITANCY 11/26/2008  ? HYPERCHOLESTEROLEMIA 09/14/2008  ? Hypertension 09/14/2008  ? CONSTIPATION 06/08/2008  ? PRURITUS 05/21/2008  ? DRY SKIN 05/21/2008  ? 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 ? ?Current Outpatient Medications on File Prior to Visit  ?Medication Sig Dispense Refill  ? amLODipine (NORVASC) 10 MG tablet Take 10 mg by mouth daily.    ? antiseptic oral rinse (BIOTENE) LIQD 15 mLs by Mouth Rinse route as needed for dry mouth. 237 mL 0  ? aspirin EC 81 MG tablet Take 81 mg by mouth daily. Swallow whole.    ? diclofenac Sodium (VOLTAREN) 1 % GEL Apply 1 application topically 4 (four) times daily as needed (pain).     ? doxycycline  (VIBRA-TABS) 100 MG tablet Take 1 tablet (100 mg total) by mouth 2 (two) times daily. 20 tablet 0  ? furosemide (LASIX) 20 MG tablet Take 1 tablet (20 mg total) by mouth daily. 30 tablet 0  ? glipiZIDE (GLUCOTROL) 10 MG tablet Take 10 mg by mouth daily.   1  ? JARDIANCE 10 MG TABS tablet Take 10 mg by mouth daily.    ? lidocaine-prilocaine (EMLA) cream Apply 1 application. topically as needed. Apply to toes as needed for pain 30 g 3  ? metFORMIN (GLUCOPHAGE) 500 MG tablet Take 500 mg by mouth 2 (two) times daily.    ? metoprolol tartrate (LOPRESSOR) 25 MG tablet Take 0.5 tablets (12.5 mg total) by mouth 2 (two) times daily. 60 tablet 0  ? mupirocin ointment (BACTROBAN) 2 % Apply 1 application topically 2 (two) times daily. Apply to left second toe and left heel twice daily and cover 30 g 2  ? pantoprazole (PROTONIX) 40 MG tablet Take 1 tablet (40 mg total) by mouth daily. 30 tablet 0  ? phenol (CHLORASEPTIC) 1.4 % LIQD Use as directed 1 spray in the mouth or throat as needed for throat irritation / pain. 325 mL 0  ? rosuvastatin (CRESTOR) 40 MG tablet Take 1 tablet (40 mg total) by mouth daily. 90 tablet 3  ? traMADol (ULTRAM) 50 MG tablet Take 1 tablet (50 mg total) by mouth every 6 (six) hours as needed for  moderate pain. (Patient taking differently: Take 25-50 mg by mouth every 6 (six) hours as needed for moderate pain.) 30 tablet 0  ? ?No current facility-administered medications on file prior to visit.  ? ? ?No Known Allergies ? ?Review of Systems ?No fevers, chills, nausea, muscle aches, no difficulty breathing, no calf pain, no chest pain or shortness of breath. ? ? ?GENERAL APPEARANCE: Alert, conversant. Appropriately groomed. No acute distress.  ?  ?VASCULAR: Pedal pulses non palpable  DP and PT bilateral.  Capillary refill time is delayed to all digits,  Proximal to distal cooling is cool to cool.  Digital hair growth absent. skin on feet and legs is shiny.  ?  ?NEUROLOGIC: sensation is intact to 5.07  monofilament at 3 /5 sites bilateral.  Light touch is intact bilateral. No clonus noted. Downgoing babinski.  ?  ?MUSCULOSKELETAL: acceptable muscle strength, tone and stability bilateral.  Mild digital contractures noted bilateral.   ?  ?DERMATOLOGIC:  skin is thin, shiny and atrophic.  The left second toe appears to have worsened form the previous visit.  There is dried eschar at the tip of the toe and yellow slough at the tip of the toe- concern for exposed bone also present.  Third toe appears to be healing.  No redness, no swelling, no drainage and no malodor is noted.  Deep skin fissure full thickness in depth is also present on the lateral left heel.  it measures 0.5cm x 0.2cm x 0.3cm in depth. no drainage, no redness is noted. Pain with pressure is noted. No sign of infection.  ?  ?Assessment  ? ?  ICD-10-CM   ?1. Ulcer of left second toe, with fat layer exposed ([facility])  L97.522   ?  ?2. Fissure in skin of foot  R23.4   ?  ?3. PAD (peripheral artery disease) ([facility])  I73.9   ?  ? ? ? ?Plan ? ?I cleansed the ulcers with wound wash and applied iodosorb and a dry and sterile dressing to the second and third toes. I debrided the skin fissure/ ulcer and  I applied a soft conforming dressing to the lateral heel.  I discussed her pain is likely from the decreased blood flow and that healing these wounds is going to be very challenging with her blood flow issues.  I discussed the importance of her upcoming appointment at [facility].  She will see me in 2 weeks for follow up.  If any increased redness, drainage, malodor, pus or pain occur, she is to call our office.  ?

## 2021-09-09 ENCOUNTER — Ambulatory Visit (INDEPENDENT_AMBULATORY_CARE_PROVIDER_SITE_OTHER): Payer: Medicare Other | Admitting: Surgery

## 2021-09-09 ENCOUNTER — Other Ambulatory Visit: Payer: Self-pay

## 2021-09-09 ENCOUNTER — Ambulatory Visit (HOSPITAL_COMMUNITY)
Admission: RE | Admit: 2021-09-09 | Discharge: 2021-09-09 | Disposition: A | Discharge status: Home / Self Care | Payer: Medicare Other | Source: Ambulatory Visit | Attending: Surgery | Admitting: Surgery

## 2021-09-09 ENCOUNTER — Encounter: Payer: Self-pay | Admitting: Surgery

## 2021-09-09 ENCOUNTER — Encounter (HOSPITAL_COMMUNITY): Payer: Self-pay

## 2021-09-09 VITALS — BP 126/67 | HR 51 | Temp 97.9°F | Resp 20 | Ht <= 58 in | Wt 120.0 lb

## 2021-09-09 DIAGNOSIS — I70248 Atherosclerosis of native arteries of left leg with ulceration of other part of lower left leg: Secondary | ICD-10-CM

## 2021-09-09 LAB — POCT I-STAT CREATININE: Creatinine, Ser: 2.3 mg/dL — ABNORMAL HIGH (ref 0.44–1.00)

## 2021-09-09 MED ORDER — IOHEXOL 350 MG/ML SOLN
100.0000 mL | Freq: Once | INTRAVENOUS | Status: AC | PRN
Start: 2021-09-09 — End: 2021-09-09
  Administered 2021-09-09: 100 mL via INTRAVENOUS

## 2021-09-09 MED ORDER — SODIUM CHLORIDE (PF) 0.9 % IJ SOLN
INTRAMUSCULAR | Status: AC
  Filled 2021-09-09: qty 50

## 2021-09-09 NOTE — Progress Notes (Signed)
? ?[facility] ? ?Patient name: Amanda [name] MRN: [id number] DOB: [birth year] Sex: female ? ? ?REQUESTING PROVIDER:  ? ? Dr. Valentina Lucks ? ? ?REASON FOR CONSULT:  ?  ?PAD ? ?HISTORY OF PRESENT ILLNESS:  ? ?Amanda [name] is a 76 y.o. female, who is referred for vascular evaluation for left second and third toe wounds.  She has been treating the areas with mupirocin.  These areas are very painful.  Information is obtained from her son who is acting as the interpreter, as the interpreter services do not recognize her dialect. ? ?Patient is a non-smoker.  She is medically managed for hypertension.  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  She is diabetic ? ?PAST MEDICAL HISTORY  ? ? ?Past Medical History:  ?Diagnosis Date  ? Abdominal pain   ? Chest pain   ? Atypical  ? Diabetes mellitus   ? GERD (gastroesophageal reflux disease)   ? Hyperlipidemia   ? Hypertension   ? ? ? ?FAMILY HISTORY  ? ?Family History  ?Family history unknown: Yes  ? ? ?SOCIAL HISTORY:  ? ?Social History  ? ?Socioeconomic History  ? Marital status: Widowed  ?  Spouse name: Not on file  ? Number of children: 6  ? Years of education: Not on file  ? Highest education level: Not on file  ?Occupational History  ? Not on file  ?Tobacco Use  ? Smoking status: Never  ? Smokeless tobacco: Never  ?Vaping Use  ? Vaping Use: Never used  ?Substance and Sexual Activity  ? Alcohol use: No  ? Drug use: No  ? Sexual activity: Not on file  ?Other Topics Concern  ? Not on file  ?Social History Narrative  ? Lives in [place], [place] with son.   ? ?Social Determinants of Health  ? ?Financial Resource Strain: Not on file  ?Food Insecurity: Not on file  ?Transportation Needs: Not on file  ?Physical Activity: Not on file  ?Stress: Not on file  ?Social Connections: Not on file  ?Intimate Partner Violence: Not on file  ? ? ?ALLERGIES:  ? ? ?No Known Allergies ? ?CURRENT MEDICATIONS:  ? ? ?Current  Outpatient Medications  ?Medication Sig Dispense Refill  ? amLODipine (NORVASC) 10 MG tablet Take 10 mg by mouth daily.    ? antiseptic oral rinse (BIOTENE) LIQD 15 mLs by Mouth Rinse route as needed for dry mouth. 237 mL 0  ? aspirin EC 81 MG tablet Take 81 mg by mouth daily. Swallow whole.    ? diclofenac Sodium (VOLTAREN) 1 % GEL Apply 1 application topically 4 (four) times daily as needed (pain).     ? furosemide (LASIX) 20 MG tablet Take 1 tablet (20 mg total) by mouth daily. 30 tablet 0  ? glipiZIDE (GLUCOTROL) 10 MG tablet Take 10 mg by mouth daily.   1  ? JARDIANCE 10 MG TABS tablet Take 10 mg by mouth daily.    ? lidocaine-prilocaine (EMLA) cream Apply 1 application. topically as needed. Apply to toes as needed for pain 30 g 3  ? metFORMIN (GLUCOPHAGE) 500 MG tablet Take 500 mg by mouth 2 (two) times daily.    ? metoprolol tartrate (LOPRESSOR) 25 MG tablet Take 0.5 tablets (12.5 mg total) by mouth 2 (two) times daily. 60 tablet 0  ? mupirocin ointment (BACTROBAN) 2 % Apply 1 application topically 2 (two) times daily. Apply to left second toe and left heel twice daily and cover 30 g 2  ?  pantoprazole (PROTONIX) 40 MG tablet Take 1 tablet (40 mg total) by mouth daily. 30 tablet 0  ? phenol (CHLORASEPTIC) 1.4 % LIQD Use as directed 1 spray in the mouth or throat as needed for throat irritation / pain. 325 mL 0  ? traMADol (ULTRAM) 50 MG tablet Take 1 tablet (50 mg total) by mouth every 6 (six) hours as needed for moderate pain. (Patient taking differently: Take 25-50 mg by mouth every 6 (six) hours as needed for moderate pain.) 30 tablet 0  ? doxycycline (VIBRA-TABS) 100 MG tablet Take 1 tablet (100 mg total) by mouth 2 (two) times daily. (Patient not taking: Reported on 09/09/2021) 20 tablet 0  ? rosuvastatin (CRESTOR) 40 MG tablet Take 1 tablet (40 mg total) by mouth daily. 90 tablet 3  ? ?No current facility-administered medications for this visit.  ? ? ?REVIEW OF SYSTEMS:  ? ?[X]  denotes positive finding, [  ] denotes negative finding ?Cardiac  Comments:  ?Chest pain or chest pressure:    ?Shortness of breath upon exertion:    ?Short of breath when lying flat:    ?Irregular heart rhythm:    ?    ?Vascular    ?Pain in calf, thigh, or hip brought on by ambulation:    ?Pain in feet at night that wakes you up from your sleep:  x   ?Blood clot in your veins:    ?Leg swelling:     ?    ?Pulmonary    ?Oxygen at home:    ?Productive cough:     ?Wheezing:     ?    ?Neurologic    ?Sudden weakness in arms or legs:     ?Sudden numbness in arms or legs:     ?Sudden onset of difficulty speaking or slurred speech:    ?Temporary loss of vision in one eye:     ?Problems with dizziness:     ?    ?Gastrointestinal    ?Blood in stool:     ? ?Vomited blood:     ?    ?Genitourinary    ?Burning when urinating:     ?Blood in urine:    ?    ?Psychiatric    ?Major depression:     ?    ?Hematologic    ?Bleeding problems:    ?Problems with blood clotting too easily:    ?    ?Skin    ?Rashes or ulcers:    ?    ?Constitutional    ?Fever or chills:    ? ?PHYSICAL EXAM:  ? ?Vitals:  ? 09/09/21 1113  ?BP: 126/67  ?Pulse: (!) 51  ?Resp: 20  ?Temp: 97.9 ?F (36.6 ?C)  ?SpO2: 94%  ?Weight: 120 lb (54.4 kg)  ?Height: 4\' 9"  (1.448 m)  ? ? ?GENERAL: The patient is a well-nourished female, in no acute distress. The vital signs are documented above. ?CARDIAC: There is a regular rate and rhythm.  ?VASCULAR: I could not palpate femoral pulses ?PULMONARY: Nonlabored respirations ?ABDOMEN: Soft and non-tender  ?MUSCULOSKELETAL: There are no major deformities or cyanosis. ?NEUROLOGIC: No focal weakness or paresthesias are detected. ?SKIN: There are no ulcers or rashes noted. ?PSYCHIATRIC: The patient has a normal affect. ? ? ?STUDIES:  ? ?I have reviewed the following: ?+-------+-----------+-----------+------------+------------+  ?ABI/TBIToday's ABIToday's TBIPrevious ABIPrevious TBI  ?+-------+-----------+-----------+------------+------------+  ?Right  0.60        0.24       0.60        0.27          ?+-------+-----------+-----------+------------+------------+  ?  Left   0.63       0.0        0.56        0.23          ?+-------+-----------+-----------+------------+------------+ ?Right toe pressure equals 39 ?Left toe pressure equals 0 ? ?ASSESSMENT and PLAN  ? ?PAD with ulcer, left leg: This is a limb threatening situation.  I cannot palpate femoral pulses and so I think the neck step is to get a CT angiogram of the abdomen pelvis with bilateral runoff to see what our options for revascularization are.  I suspect she has aortoiliac disease and may require surgical intervention.  I am going to try to get her CT scan over the weekend and see her back in the office on Monday for definitive decisions regarding treatment options. ? ? ?Annamarie Major, IV, MD, FACS ?[facility] ?Tel [phone] ?Pager [phone]  ?

## 2021-09-12 ENCOUNTER — Ambulatory Visit: Payer: Medicare Other | Admitting: Surgery

## 2021-09-14 ENCOUNTER — Encounter: Payer: Self-pay | Admitting: Surgery

## 2021-09-14 ENCOUNTER — Ambulatory Visit (INDEPENDENT_AMBULATORY_CARE_PROVIDER_SITE_OTHER): Payer: Medicare Other | Admitting: Surgery

## 2021-09-14 DIAGNOSIS — I70248 Atherosclerosis of native arteries of left leg with ulceration of other part of lower left leg: Secondary | ICD-10-CM

## 2021-09-14 NOTE — Progress Notes (Signed)
?           ? ?[facility] ? ?Patient name: Amanda [name] MRN: [id number] DOB: Oct 27, 1945 Sex: female ? ? ?  ? ?Virtual Visit via Telephone Note  ? ?This visit type was conducted due to national recommendations for restrictions regarding the COVID-19 Pandemic (e.g. social distancing) in an effort to limit this patient's exposure and mitigate transmission in our community.  Due to her co-morbid illnesses, this patient is at least at moderate risk for complications without adequate follow up.  This format is felt to be most appropriate for this patient at this time.  The patient did not have access to video technology/had technical difficulties with video requiring transitioning to audio format only (telephone).  All issues noted in this document were discussed and addressed.  No physical exam could be performed with this format.   ? ?Patient Location: Home ?Provider Location: Office/Clinic ? ? ? ? ?REASON FOR APPOINTMENT:  ?  ?Follow up ? ?HISTORY OF PRESENT ILLNESS:  ? ?Amanda [name] is a 76 y.o. female, who is referred for vascular evaluation for left second and third toe wounds.  She has been treating the areas with mupirocin.  These areas are very painful.   I sent her for a CTA because I could not palpate femoral pulses.  This was not done because of elevated creatinine. I called to discuss this with her son who is interpreting ? ?Patient is a non-smoker.  She is medically managed for hypertension.  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  She is diabetic ? ? ? ?PAST MEDICAL HISTORY  ? ? ?Past Medical History:  ?Diagnosis Date  ? Abdominal pain   ? Chest pain   ? Atypical  ? Diabetes mellitus   ? GERD (gastroesophageal reflux disease)   ? Hyperlipidemia   ? Hypertension   ? ? ? ?FAMILY HISTORY  ? ?Family History  ?Family history unknown: Yes  ? ? ?SOCIAL HISTORY:  ? ?Social History  ? ?Socioeconomic History  ? Marital status: Widowed  ?  Spouse name: Not on file  ?  Number of children: 6  ? Years of education: Not on file  ? Highest education level: Not on file  ?Occupational History  ? Not on file  ?Tobacco Use  ? Smoking status: Never  ? Smokeless tobacco: Never  ?Vaping Use  ? Vaping Use: Never used  ?Substance and Sexual Activity  ? Alcohol use: No  ? Drug use: No  ? Sexual activity: Not on file  ?Other Topics Concern  ? Not on file  ?Social History Narrative  ? Lives in [place], [place] with son.   ? ?Social Determinants of Health  ? ?Financial Resource Strain: Not on file  ?Food Insecurity: Not on file  ?Transportation Needs: Not on file  ?Physical Activity: Not on file  ?Stress: Not on file  ?Social Connections: Not on file  ?Intimate Partner Violence: Not on file  ? ? ?ALLERGIES:  ? ? ?No Known Allergies ? ?CURRENT MEDICATIONS:  ? ? ?Current Outpatient Medications  ?Medication Sig Dispense Refill  ? amLODipine (NORVASC) 10 MG tablet Take 10 mg by mouth daily.    ? antiseptic oral rinse (BIOTENE) LIQD 15 mLs by Mouth Rinse route as needed for dry mouth. 237 mL 0  ? aspirin EC 81 MG tablet Take 81 mg by mouth daily. Swallow whole.    ? diclofenac Sodium (VOLTAREN) 1 % GEL Apply 1 application topically 4 (four)  times daily as needed (pain).     ? doxycycline (VIBRA-TABS) 100 MG tablet Take 1 tablet (100 mg total) by mouth 2 (two) times daily. (Patient not taking: Reported on 09/09/2021) 20 tablet 0  ? furosemide (LASIX) 20 MG tablet Take 1 tablet (20 mg total) by mouth daily. 30 tablet 0  ? glipiZIDE (GLUCOTROL) 10 MG tablet Take 10 mg by mouth daily.   1  ? JARDIANCE 10 MG TABS tablet Take 10 mg by mouth daily.    ? lidocaine-prilocaine (EMLA) cream Apply 1 application. topically as needed. Apply to toes as needed for pain 30 g 3  ? metFORMIN (GLUCOPHAGE) 500 MG tablet Take 500 mg by mouth 2 (two) times daily.    ? metoprolol tartrate (LOPRESSOR) 25 MG tablet Take 0.5 tablets (12.5 mg total) by mouth 2 (two) times daily. 60 tablet 0  ? mupirocin ointment (BACTROBAN) 2 %  Apply 1 application topically 2 (two) times daily. Apply to left second toe and left heel twice daily and cover 30 g 2  ? pantoprazole (PROTONIX) 40 MG tablet Take 1 tablet (40 mg total) by mouth daily. 30 tablet 0  ? phenol (CHLORASEPTIC) 1.4 % LIQD Use as directed 1 spray in the mouth or throat as needed for throat irritation / pain. 325 mL 0  ? rosuvastatin (CRESTOR) 40 MG tablet Take 1 tablet (40 mg total) by mouth daily. 90 tablet 3  ? traMADol (ULTRAM) 50 MG tablet Take 1 tablet (50 mg total) by mouth every 6 (six) hours as needed for moderate pain. (Patient taking differently: Take 25-50 mg by mouth every 6 (six) hours as needed for moderate pain.) 30 tablet 0  ? ?No current facility-administered medications for this visit.  ? ? ?REVIEW OF SYSTEMS:  ? ?Please see the history of present illness.    ? ?All other systems reviewed and are negative. ? ?PHYSICAL EXAM:  ? ?There were no vitals filed for this visit. ? ? ? ?Recent Labs: ?05/03/2021: BUN 35; Potassium 4.1; Sodium 136 ?09/09/2021: Creatinine, Ser 2.30  ? ?Recent Lipid Panel ?Lab Results  ?Component Value Date/Time  ? CHOL 274 (H) 06/03/2020 05:20 PM  ? TRIG 141 06/03/2020 05:20 PM  ? HDL 67 06/03/2020 05:20 PM  ? CHOLHDL 4.1 06/03/2020 05:20 PM  ? LDLCALC 179 (H) 06/03/2020 05:20 PM  ? ? ?Wt Readings from Last 3 Encounters:  ?09/09/21 120 lb (54.4 kg)  ?07/13/20 115 lb 3.2 oz (52.3 kg)  ?03/24/20 114 lb 3.2 oz (51.8 kg)  ?  ? ?STUDIES:  ? ?+-------+-----------+-----------+------------+------------+  ?ABI/TBIToday's ABIToday's TBIPrevious ABIPrevious TBI  ?+-------+-----------+-----------+------------+------------+  ?Right  0.60       0.24       0.60        0.27          ?+-------+-----------+-----------+------------+------------+  ?Left   0.63       0.0        0.56        0.23          ?+-------+-----------+-----------+------------+------------+ ?Right toe pressure equals 39 ?Left toe pressure equals 0 ?  ? ?ASSESSMENT and PLAN   ? ?Critical limb ischemia of the left leg. Since a CTA could not be performed because of elevated creatinine, I thin she needs to be referred to nephrology, and we need to proceed with angiography using limited dye and CO2.  This will need to likely be done through a left brachial approach.  I discussed this with her nephew and  1st cousin (who is a neurologist).  I wiill plan for angiogram with intervention on Tuesday ? ? ?Time:   ?Today, I have spent 15 minutes with the patient with telehealth technology discussing the above problems.   ? ? ?  ? ?Annamarie Major, IV, MD, FACS ?[facility] ?Tel [phone] ?Pager [phone] ?  ?

## 2021-09-16 ENCOUNTER — Other Ambulatory Visit: Payer: Self-pay

## 2021-09-19 ENCOUNTER — Ambulatory Visit: Payer: Medicare Other | Admitting: Podiatrist

## 2021-09-20 ENCOUNTER — Encounter (HOSPITAL_COMMUNITY)
Admission: RE | Disposition: A | Discharge status: Home / Self Care | Payer: Self-pay | Source: Home / Self Care | Attending: Surgery

## 2021-09-20 ENCOUNTER — Observation Stay (HOSPITAL_COMMUNITY)
Admission: RE | Admit: 2021-09-20 | Discharge: 2021-09-21 | Disposition: A | Discharge status: Home / Self Care | Payer: Medicare Other | Attending: Surgery | Admitting: Surgery

## 2021-09-20 DIAGNOSIS — I70245 Atherosclerosis of native arteries of left leg with ulceration of other part of foot: Secondary | ICD-10-CM

## 2021-09-20 DIAGNOSIS — I739 Peripheral vascular disease, unspecified: Secondary | ICD-10-CM | POA: Diagnosis present

## 2021-09-20 DIAGNOSIS — Z7984 Long term (current) use of oral hypoglycemic drugs: Secondary | ICD-10-CM | POA: Diagnosis not present

## 2021-09-20 DIAGNOSIS — L97529 Non-pressure chronic ulcer of other part of left foot with unspecified severity: Secondary | ICD-10-CM | POA: Insufficient documentation

## 2021-09-20 HISTORY — PX: ABDOMINAL AORTOGRAM W/LOWER EXTREMITY: CATH118223

## 2021-09-20 HISTORY — PX: PERIPHERAL VASCULAR BALLOON ANGIOPLASTY: CATH118281

## 2021-09-20 LAB — POCT I-STAT, CHEM 8
BUN: 46 mg/dL — ABNORMAL HIGH (ref 8–23)
Calcium, Ion: 1.23 mmol/L (ref 1.15–1.40)
Chloride: 105 mmol/L (ref 98–111)
Creatinine, Ser: 1.8 mg/dL — ABNORMAL HIGH (ref 0.44–1.00)
Glucose, Bld: 143 mg/dL — ABNORMAL HIGH (ref 70–99)
HCT: 35 % — ABNORMAL LOW (ref 36.0–46.0)
Hemoglobin: 11.9 g/dL — ABNORMAL LOW (ref 12.0–15.0)
Potassium: 4.9 mmol/L (ref 3.5–5.1)
Sodium: 142 mmol/L (ref 135–145)
TCO2: 29 mmol/L (ref 22–32)

## 2021-09-20 LAB — GLUCOSE, CAPILLARY
Glucose-Capillary: 116 mg/dL — ABNORMAL HIGH (ref 70–99)
Glucose-Capillary: 243 mg/dL — ABNORMAL HIGH (ref 70–99)

## 2021-09-20 LAB — POCT ACTIVATED CLOTTING TIME
Activated Clotting Time: 161 seconds
Activated Clotting Time: 239 seconds

## 2021-09-20 SURGERY — ABDOMINAL AORTOGRAM W/LOWER EXTREMITY
Anesthesia: LOCAL

## 2021-09-20 MED ORDER — LABETALOL HCL 5 MG/ML IV SOLN: 10.0000 mg | INTRAVENOUS | Status: DC | PRN

## 2021-09-20 MED ORDER — MIDAZOLAM HCL 2 MG/2ML IJ SOLN
INTRAMUSCULAR | Status: AC
  Filled 2021-09-20: qty 2

## 2021-09-20 MED ORDER — HYDRALAZINE HCL 20 MG/ML IJ SOLN: 5.0000 mg | INTRAMUSCULAR | Status: DC | PRN

## 2021-09-20 MED ORDER — MORPHINE SULFATE (PF) 2 MG/ML IV SOLN: 2.0000 mg | INTRAVENOUS | Status: DC | PRN

## 2021-09-20 MED ORDER — HEPARIN SODIUM (PORCINE) 1000 UNIT/ML IJ SOLN
INTRAMUSCULAR | Status: DC | PRN
  Administered 2021-09-20: 2000 [IU] via INTRAVENOUS
  Administered 2021-09-20: 6000 [IU] via INTRAVENOUS

## 2021-09-20 MED ORDER — HEPARIN (PORCINE) IN NACL 1000-0.9 UT/500ML-% IV SOLN
INTRAVENOUS | Status: DC | PRN
  Administered 2021-09-20 (×2): 500 mL

## 2021-09-20 MED ORDER — PROTAMINE SULFATE 10 MG/ML IV SOLN
50.0000 mg | Freq: Once | INTRAVENOUS | Status: DC
Start: 2021-09-20 — End: 2021-09-20

## 2021-09-20 MED ORDER — PROTAMINE SULFATE 10 MG/ML IV SOLN
25.0000 mg | Freq: Once | INTRAVENOUS | Status: AC
  Administered 2021-09-20: 25 mg via INTRAVENOUS

## 2021-09-20 MED ORDER — ALUM & MAG HYDROXIDE-SIMETH 200-200-20 MG/5ML PO SUSP: 15.0000 mL | ORAL | Status: DC | PRN

## 2021-09-20 MED ORDER — ROSUVASTATIN CALCIUM 20 MG PO TABS
40.0000 mg | ORAL_TABLET | Freq: Every day | ORAL | Status: DC
  Administered 2021-09-20 – 2021-09-21 (×2): 40 mg via ORAL
  Filled 2021-09-20 (×2): qty 2

## 2021-09-20 MED ORDER — ONDANSETRON HCL 4 MG/2ML IJ SOLN: 4.0000 mg | Freq: Four times a day (QID) | INTRAMUSCULAR | Status: DC | PRN

## 2021-09-20 MED ORDER — GUAIFENESIN-DM 100-10 MG/5ML PO SYRP: 15.0000 mL | ORAL_SOLUTION | ORAL | Status: DC | PRN

## 2021-09-20 MED ORDER — SODIUM CHLORIDE 0.9 % IV SOLN: 250.0000 mL | INTRAVENOUS | Status: DC | PRN

## 2021-09-20 MED ORDER — SODIUM CHLORIDE 0.9 % IV SOLN: INTRAVENOUS | Status: DC

## 2021-09-20 MED ORDER — CLOPIDOGREL BISULFATE 75 MG PO TABS
75.0000 mg | ORAL_TABLET | Freq: Every day | ORAL | Status: DC
  Administered 2021-09-21: 75 mg via ORAL
  Filled 2021-09-20: qty 1

## 2021-09-20 MED ORDER — SODIUM CHLORIDE 0.9% FLUSH: 3.0000 mL | INTRAVENOUS | Status: DC | PRN

## 2021-09-20 MED ORDER — INSULIN ASPART 100 UNIT/ML IJ SOLN
0.0000 [IU] | Freq: Three times a day (TID) | INTRAMUSCULAR | Status: DC
  Administered 2021-09-21: 2 [IU] via SUBCUTANEOUS

## 2021-09-20 MED ORDER — IODIXANOL 320 MG/ML IV SOLN
INTRAVENOUS | Status: DC | PRN
  Administered 2021-09-20: 75 mL

## 2021-09-20 MED ORDER — ACETAMINOPHEN 325 MG PO TABS: 650.0000 mg | ORAL_TABLET | ORAL | Status: DC | PRN

## 2021-09-20 MED ORDER — HEPARIN SODIUM (PORCINE) 1000 UNIT/ML IJ SOLN
INTRAMUSCULAR | Status: AC
  Filled 2021-09-20: qty 10

## 2021-09-20 MED ORDER — CLOPIDOGREL BISULFATE 75 MG PO TABS
300.0000 mg | ORAL_TABLET | Freq: Once | ORAL | Status: AC
  Administered 2021-09-20: 300 mg via ORAL

## 2021-09-20 MED ORDER — SODIUM CHLORIDE 0.9% FLUSH
3.0000 mL | Freq: Two times a day (BID) | INTRAVENOUS | Status: DC
  Administered 2021-09-21: 3 mL via INTRAVENOUS

## 2021-09-20 MED ORDER — SODIUM CHLORIDE 0.9 % WEIGHT BASED INFUSION
1.0000 mL/kg/h | INTRAVENOUS | Status: AC
  Administered 2021-09-20: 1 mL/kg/h via INTRAVENOUS

## 2021-09-20 MED ORDER — METOPROLOL TARTRATE 5 MG/5ML IV SOLN: 2.0000 mg | INTRAVENOUS | Status: DC | PRN

## 2021-09-20 MED ORDER — PANTOPRAZOLE SODIUM 40 MG PO TBEC
40.0000 mg | DELAYED_RELEASE_TABLET | Freq: Every day | ORAL | Status: DC
  Administered 2021-09-20 – 2021-09-21 (×2): 40 mg via ORAL
  Filled 2021-09-20 (×2): qty 1

## 2021-09-20 MED ORDER — FENTANYL CITRATE (PF) 100 MCG/2ML IJ SOLN
INTRAMUSCULAR | Status: DC | PRN
  Administered 2021-09-20: 25 ug via INTRAVENOUS
  Administered 2021-09-20: 50 ug via INTRAVENOUS

## 2021-09-20 MED ORDER — PROTAMINE SULFATE 10 MG/ML IV SOLN
INTRAVENOUS | Status: AC
  Filled 2021-09-20: qty 5

## 2021-09-20 MED ORDER — LIDOCAINE HCL (PF) 1 % IJ SOLN
INTRAMUSCULAR | Status: AC
  Filled 2021-09-20: qty 30

## 2021-09-20 MED ORDER — PHENOL 1.4 % MT LIQD: 1.0000 | OROMUCOSAL | Status: DC | PRN

## 2021-09-20 MED ORDER — ASPIRIN EC 81 MG PO TBEC
81.0000 mg | DELAYED_RELEASE_TABLET | Freq: Every day | ORAL | Status: DC
  Administered 2021-09-21: 81 mg via ORAL
  Filled 2021-09-20: qty 1

## 2021-09-20 MED ORDER — LIDOCAINE HCL (PF) 1 % IJ SOLN
INTRAMUSCULAR | Status: DC | PRN
  Administered 2021-09-20: 12 mL
  Administered 2021-09-20: 5 mL

## 2021-09-20 MED ORDER — HEPARIN (PORCINE) IN NACL 1000-0.9 UT/500ML-% IV SOLN
INTRAVENOUS | Status: AC
  Filled 2021-09-20: qty 1000

## 2021-09-20 MED ORDER — MIDAZOLAM HCL 2 MG/2ML IJ SOLN
INTRAMUSCULAR | Status: DC | PRN
  Administered 2021-09-20 (×2): 1 mg via INTRAVENOUS

## 2021-09-20 MED ORDER — FENTANYL CITRATE (PF) 100 MCG/2ML IJ SOLN
INTRAMUSCULAR | Status: AC
  Filled 2021-09-20: qty 2

## 2021-09-20 MED ORDER — NITROGLYCERIN 1 MG/10 ML FOR IR/CATH LAB
INTRA_ARTERIAL | Status: AC
  Filled 2021-09-20: qty 10

## 2021-09-20 MED ORDER — ACETAMINOPHEN 325 MG PO TABS: 325.0000 mg | ORAL_TABLET | ORAL | Status: DC | PRN

## 2021-09-20 MED ORDER — CLOPIDOGREL BISULFATE 300 MG PO TABS
ORAL_TABLET | ORAL | Status: AC
  Filled 2021-09-20: qty 1

## 2021-09-20 MED ORDER — CLOPIDOGREL BISULFATE 75 MG PO TABS: 75.0000 mg | ORAL_TABLET | Freq: Every day | ORAL | 11 refills | Status: DC

## 2021-09-20 MED ORDER — POTASSIUM CHLORIDE CRYS ER 20 MEQ PO TBCR: 20.0000 meq | EXTENDED_RELEASE_TABLET | Freq: Once | ORAL | Status: DC

## 2021-09-20 MED ORDER — OXYCODONE HCL 5 MG PO TABS: 5.0000 mg | ORAL_TABLET | ORAL | Status: DC | PRN

## 2021-09-20 MED ORDER — NITROGLYCERIN 1 MG/10 ML FOR IR/CATH LAB
INTRA_ARTERIAL | Status: DC | PRN
  Administered 2021-09-20: 200 ug

## 2021-09-20 MED ORDER — ACETAMINOPHEN 325 MG RE SUPP
325.0000 mg | RECTAL | Status: DC | PRN
  Filled 2021-09-20: qty 2

## 2021-09-20 SURGICAL SUPPLY — 29 items
BAG SNAP BAND KOVER 36X36 (MISCELLANEOUS) ×1 IMPLANT
BALLN STERLING OTW 3X100X150 (BALLOONS) ×3
BALLOON STERLING OTW 3X100X150 (BALLOONS) IMPLANT
CATH ANGIO 5F BER2 100CM (CATHETERS) ×1 IMPLANT
CATH ANGIO 5F BER2 65CM (CATHETERS) ×1 IMPLANT
CATH ANGIO 5F PIGTAIL 100CM (CATHETERS) ×1 IMPLANT
CATH CROSS OVER TEMPO 5F (CATHETERS) ×1 IMPLANT
CATH NAVICROSS ANG 65CM (CATHETERS) IMPLANT
CATH OMNI FLUSH 5F 65CM (CATHETERS) ×1 IMPLANT
CATHETER NAVICROSS ANG 65CM (CATHETERS) ×3
DCB RANGER 4.0X150 150 (BALLOONS) IMPLANT
DCB RANGER 4.0X80 135 (BALLOONS) IMPLANT
GLIDEWIRE ADV .035X260CM (WIRE) ×1 IMPLANT
KIT ENCORE 26 ADVANTAGE (KITS) ×1 IMPLANT
KIT MICROPUNCTURE NIT STIFF (SHEATH) ×1 IMPLANT
KIT PV (KITS) ×3 IMPLANT
RANGER DCB 4.0X150 150 (BALLOONS) ×3
RANGER DCB 4.0X80 135 (BALLOONS) ×3
SHEATH FLEX ANSEL ANG 5F 45CM (SHEATH) ×1 IMPLANT
SHEATH GLIDE SLENDER 4/5FR (SHEATH) ×1 IMPLANT
SHEATH PINNACLE 5F 10CM (SHEATH) ×1 IMPLANT
SHEATH PROBE COVER 6X72 (BAG) ×1 IMPLANT
STOPCOCK MORSE 400PSI 3WAY (MISCELLANEOUS) ×2 IMPLANT
SYR MEDRAD MARK V 150ML (SYRINGE) ×1 IMPLANT
TRANSDUCER W/STOPCOCK (MISCELLANEOUS) ×3 IMPLANT
TRAY PV CATH (CUSTOM PROCEDURE TRAY) ×3 IMPLANT
TUBING CONTRAST HIGH PRESS 20 (MISCELLANEOUS) ×1 IMPLANT
WIRE BENTSON .035X145CM (WIRE) ×1 IMPLANT
WIRE G V18X300CM (WIRE) ×1 IMPLANT

## 2021-09-20 NOTE — Progress Notes (Signed)
Due to timing of sheath removal, short stay will be closed and so she needs to be admitted.  I will observe her overnight and check labs in the am.  If all is ok, she can go home in the morning ? ? ?[place] Othe[facility] Dickenson ?

## 2021-09-20 NOTE — Op Note (Signed)
? ? ?Patient name: Amanda [name] MRN: [id number] DOB: 1945-12-27 Sex: female ? ?09/20/2021 ?Pre-operative Diagnosis: Left toe ulcer ?Post-operative diagnosis:  Same ?Surgeon:  Annamarie Major ?Procedure Performed: ? 1.  Ultrasound-guided access, left brachial artery ? 2.  Ultrasound-guided access, right femoral artery ? 3.  Abdominal aortogram ? 4.  Left lower extremity runoff ? 5.  Drug-coated balloon angioplasty, left popliteal artery ? 6.  Balloon angioplasty, left tibioperoneal trunk and peroneal artery ? 7.  Conscious sedation, 105 minutes ? ?[id number] ?Indications: This is a 76 year old female with nonhealing left toe wound.  I could not feel femoral pulses and so I try to get a CT angiogram however her creatinine was elevated.  Therefore she comes in today for angiographic imaging. ? ?Procedure:  The patient was identified in the holding area and taken to room 8.  The patient was then placed supine on the table and prepped and draped in the usual sterile fashion.  A time out was called.  Conscious sedation was administered with the use of IV fentanyl and Versed under continuous physician and nurse monitoring.  Heart rate, blood pressure, and oxygen saturation were continuously monitored.  Total sedation time was 105 minutes.  Ultrasound was used to evaluate the left brachial artery.  It was widely patent without significant disease.  1% lidocaine was used for local anesthesia.  The left brachial artery then cannulated ultrasound guidance with a micropuncture needle.  A 018 wire was advanced without resistance followed by placement of a 5 slender sheath.  I then used a crossover catheter and a Glidewire advantage to gain access into the descending thoracic aorta.  I then placed a pigtail catheter at the level of L1 and an abdominal aortogram was performed.  I then used a Berenstein 2 catheter to gain access into the left iliac system and then left leg runoff was performed. ? ?Findings:  ? Aortogram: Difficulty  visualizing the left renal artery.  The right renal artery is widely patent.  The superior mesenteric artery is very large in caliber measuring about the same diameter as the aorta.  The aorta is somewhat tortuous and rather small and caliber.  Iliac vessels are calcified and on the small side without hemodynamically significant stenosis ? Right Lower Extremity: Not evaluated due to patient's renal insufficiency ? Left Lower Extremity: The left common femoral and profundofemoral artery are widely patent.  The superficial femoral artery is diffusely diseased and relatively small in caliber measuring about 4-5 mm.  There is a occlusion of the popliteal artery at the level of the joint space.  There is single-vessel runoff via the peroneal artery which reconstitutes near its origin.  This collateralizes out onto the foot. ? ?Intervention: After the above images were acquired the decision made to proceed with intervention.  I elected to gain access into the right common femoral artery.  This was done under ultrasound guidance using a micropuncture needle.  A 5 French sheath was placed.  The aortic bifurcation was then crossed and a Glidewire advantage was advanced into the superficial femoral artery.  I then inserted a 5 x 65 sheath.  The patient was fully heparinized.  Using a V-18 wire and a 3 x 100 Sterling balloon, I was able to cross the occlusion in the popliteal artery as well as the tibioperoneal trunk and proximal peroneal artery.  I was able to reenter into the peroneal artery.  I then performed balloon angioplasty of the popliteal, tibioperoneal trunk, and peroneal artery with a 3  mm Sterling balloon.  Follow-up imaging revealed residual disease within the above-knee popliteal artery.  I then upsized to a 4 x 80 drug-coated Ranger balloon and performed balloon angioplasty of the popliteal artery.  This was done for 3 minutes.  On completion imaging there was a more proximal lesion and so I treated this using  a 4 x 150 Ranger balloon for 3 minutes.  Finally, completion imaging was performed that showed inline flow through the peroneal artery without residual stenosis.  At this point catheters and wires were removed.  The patient taken holding area for sheath pull once her coagulation profile corrects. ? ?Impression: ? 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 ?V. Annamarie Major, M.D., FACS ?[facility] ?Office: [phone] ?Pager:  [phone]  ?

## 2021-09-20 NOTE — Progress Notes (Signed)
78fr 45cm sheath removed from right groin intact. Manual pressure applied x 31min. No bleeding or hematoma present. 4x4 tegaderm dressing applied. 4/5 fr slender 10cm sheath removed from Left brachial. Manual pressure applied x 66min per Moishe Spice, RCIS. Post activity explained to patient's son via translation.Tally Joe E ? ?

## 2021-09-21 ENCOUNTER — Encounter (HOSPITAL_COMMUNITY): Payer: Self-pay | Admitting: Surgery

## 2021-09-21 DIAGNOSIS — I70245 Atherosclerosis of native arteries of left leg with ulceration of other part of foot: Secondary | ICD-10-CM | POA: Diagnosis not present

## 2021-09-21 LAB — CBC
HCT: 32.2 % — ABNORMAL LOW (ref 36.0–46.0)
Hemoglobin: 10.2 g/dL — ABNORMAL LOW (ref 12.0–15.0)
MCH: 24.9 pg — ABNORMAL LOW (ref 26.0–34.0)
MCHC: 31.7 g/dL (ref 30.0–36.0)
MCV: 78.7 fL — ABNORMAL LOW (ref 80.0–100.0)
Platelets: 133 10*3/uL — ABNORMAL LOW (ref 150–400)
RBC: 4.09 MIL/uL (ref 3.87–5.11)
RDW: 13.6 % (ref 11.5–15.5)
WBC: 4.5 10*3/uL (ref 4.0–10.5)
nRBC: 0 % (ref 0.0–0.2)

## 2021-09-21 LAB — BASIC METABOLIC PANEL
Anion gap: 5 (ref 5–15)
BUN: 29 mg/dL — ABNORMAL HIGH (ref 8–23)
CO2: 25 mmol/L (ref 22–32)
Calcium: 8.6 mg/dL — ABNORMAL LOW (ref 8.9–10.3)
Chloride: 108 mmol/L (ref 98–111)
Creatinine, Ser: 1.79 mg/dL — ABNORMAL HIGH (ref 0.44–1.00)
GFR, Estimated: 29 mL/min — ABNORMAL LOW (ref >60)
Glucose, Bld: 301 mg/dL — ABNORMAL HIGH (ref 70–99)
Potassium: 4.5 mmol/L (ref 3.5–5.1)
Sodium: 138 mmol/L (ref 135–145)

## 2021-09-21 LAB — HEMOGLOBIN A1C
Hgb A1c MFr Bld: 12.6 % — ABNORMAL HIGH (ref 4.8–5.6)
Mean Plasma Glucose: 314.92 mg/dL

## 2021-09-21 LAB — LIPID PANEL
Cholesterol: 147 mg/dL (ref 0–200)
HDL: 52 mg/dL (ref >40)
LDL Cholesterol: 72 mg/dL (ref 0–99)
Total CHOL/HDL Ratio: 2.8 RATIO
Triglycerides: 116 mg/dL (ref <150)
VLDL: 23 mg/dL (ref 0–40)

## 2021-09-21 LAB — GLUCOSE, CAPILLARY: Glucose-Capillary: 171 mg/dL — ABNORMAL HIGH (ref 70–99)

## 2021-09-21 NOTE — Discharge Instructions (Signed)
° °  [facility] [place] ° °Discharge Instructions ° °Lower Extremity Angiogram; Angioplasty/Stenting ° °Please refer to the following instructions for your post-procedure care. Your surgeon or physician assistant will discuss any changes with you. ° °Activity ° °Avoid lifting more than 8 pounds (1 gallons of milk) for 72 hours (3 days) after your procedure. You may walk as much as you can tolerate. It's OK to drive after 72 hours. ° °Bathing/Showering ° °You may shower the day after your procedure. If you have a bandage, you may remove it at 24- 48 hours. Clean your incision site with mild soap and water. Pat the area dry with a clean towel. ° °Diet ° °Resume your pre-procedure diet. There are no special food restrictions following this procedure. All patients with peripheral vascular disease should follow a low fat/low cholesterol diet. In order to heal from your surgery, it is CRITICAL to get adequate nutrition. Your body requires vitamins, minerals, and protein. Vegetables are the best source of vitamins and minerals. Vegetables also provide the perfect balance of protein. Processed food has little nutritional value, so try to avoid this. ° °Medications ° °Resume taking all of your medications unless your doctor tells you not to. If your incision is causing pain, you may take over-the-counter pain relievers such as acetaminophen (Tylenol) ° °Follow Up ° °Follow up will be arranged at the time of your procedure. You may have an office visit scheduled or may be scheduled for surgery. Ask your surgeon if you have any questions. ° °Please call us immediately for any of the following conditions: °•Severe or worsening pain your legs or feet at rest or with walking. °•Increased pain, redness, drainage at your groin puncture site. °•Fever of 101 degrees or higher. °•If you have any mild or slow bleeding from your puncture site: lie down, apply firm constant pressure over the area with a piece of  gauze or a clean wash cloth for 30 minutes- no peeking!, call 911 right away if you are still bleeding after 30 minutes, or if the bleeding is heavy and unmanageable. ° °Reduce your risk factors of vascular disease: ° °Stop smoking. If you would like help call [facility] at 1-800-QUIT-NOW (1-800-784-8669) or [place] at 336-586-4000. °Manage your cholesterol °Maintain a desired weight °Control your diabetes °Keep your blood pressure down ° °If you have any questions, please call the office at [phone] ° °

## 2021-09-21 NOTE — Discharge Summary (Signed)
?Discharge Summary ? ?Patient ID: ?Amanda [name] ?[id number] ?76 y.o. ?12-24-1945 ? ?Admit date: 09/20/2021 ? ?Discharge date and time: 09/21/2021  1:06 PM  ? ?Admitting Physician: Serafina Mitchell, MD  ? ?Discharge Physician: same ? ?Admission Diagnoses: PAD (peripheral artery disease) ([place]) [I73.9] ? ?Discharge Diagnoses: same ? ?Admission Condition: fair ? ?Discharged Condition: fair ? ?Indication for Admission: observation ? ?Hospital Course: Ms. Amanda [name] is a 76 year old female with a left toe nonhealing ulcer.  She was brought as an outpatient to the [facility] lab and underwent left lower extremity arteriogram with drug-coated balloon angioplasty of the left popliteal artery and balloon angioplasty of the left tibioperoneal trunk and peroneal artery by Dr. Trula Slade on 09/20/2021.  This procedure was performed late in the afternoon and due to timing of her sheath pull postoperatively, she was kept in observation overnight.  POD 1 groin and left arm catheterization site is without hematoma.  Left foot is warm and well-perfused.  Patient will be prescribed Plavix to take in addition to her aspirin.  She will follow-up in office in 4 to 6 weeks with left lower extremity arterial duplex.  She will continue her current wound care.  She was discharged home in stable condition. ? ?Consults: None ? ?Treatments: surgery 5/9 by Dr. Trula Slade ?1.  Ultrasound-guided access, left brachial artery ?           2.  Ultrasound-guided access, right femoral artery ?           3.  Abdominal aortogram ?           4.  Left lower extremity runoff ?           5.  Drug-coated balloon angioplasty, left popliteal artery ?           6.  Balloon angioplasty, left tibioperoneal trunk and peroneal artery ?           7.  Conscious sedation, 105 minutes ? ?Discharge Exam: See progress note 09/21/21 ?Vitals:  ? 09/21/21 0200 09/21/21 0404  ?BP:  111/65  ?Pulse:  80  ?Resp:  20  ?Temp:  97.7 ?F (36.5 ?C)  ?SpO2: 96% 97%  ? ? ? ?Disposition: Discharge  disposition: 01-Home or Self Care ? ? ? ? ? ? ?Patient Instructions:  ?Allergies as of 09/21/2021   ?No Known Allergies ?  ? ?  ?Medication List  ?  ? ?TAKE these medications   ? ?amLODipine 10 MG tablet ?Commonly known as: NORVASC ?Take 10 mg by mouth daily. ?  ?aspirin EC 81 MG tablet ?Take 81 mg by mouth daily. Swallow whole. ?  ?clopidogrel 75 MG tablet ?Commonly known as: Plavix ?Take 1 tablet (75 mg total) by mouth daily. ?  ?diclofenac Sodium 1 % Gel ?Commonly known as: VOLTAREN ?Apply 1 application topically 4 (four) times daily as needed (pain). ?  ?doxycycline 100 MG tablet ?Commonly known as: VIBRA-TABS ?Take 1 tablet (100 mg total) by mouth 2 (two) times daily. ?  ?HumaLOG KwikPen 100 UNIT/ML KwikPen ?Generic drug: insulin lispro ?Inject 2-15 Units into the skin 3 (three) times daily as needed for high blood sugar. ?  ?Jardiance 10 MG Tabs tablet ?Generic drug: empagliflozin ?Take 10 mg by mouth daily. ?  ?lidocaine-prilocaine cream ?Commonly known as: EMLA ?Apply 1 application. topically as needed. Apply to toes as needed for pain ?  ?metoprolol tartrate 25 MG tablet ?Commonly known as: LOPRESSOR ?Take 0.5 tablets (12.5 mg total) by mouth 2 (two) times daily. ?What changed: how  much to take ?  ?mupirocin ointment 2 % ?Commonly known as: BACTROBAN ?Apply 1 application topically 2 (two) times daily. Apply to left second toe and left heel twice daily and cover ?  ?pantoprazole 40 MG tablet ?Commonly known as: PROTONIX ?Take 1 tablet (40 mg total) by mouth daily. ?  ?pregabalin 50 MG capsule ?Commonly known as: LYRICA ?Take 50 mg by mouth 2 (two) times daily. ?  ?rosuvastatin 40 MG tablet ?Commonly known as: CRESTOR ?Take 1 tablet (40 mg total) by mouth daily. ?  ?Toujeo Max SoloStar 300 UNIT/ML Solostar Pen ?Generic drug: insulin glargine (2 Unit Dial) ?Inject 20 Units into the skin every evening. ?  ?traMADol 50 MG tablet ?Commonly known as: ULTRAM ?Take 1 tablet (50 mg total) by mouth every 6 (six) hours  as needed for moderate pain. ?  ?triamcinolone cream 0.1 % ?Commonly known as: KENALOG ?Apply 1 application. topically 2 (two) times daily as needed for dry skin. ?  ? ?  ? ?Activity: activity as tolerated ?Diet: regular diet ?Wound Care: keep wound clean and dry ? ?Follow-up with [facility] in 5 weeks. ? ?Signed: ?Dagoberto Ligas, PA-C ?09/21/2021 ?3:24 PM ?[facility] Office: [phone] ? ?

## 2021-09-21 NOTE — Interval H&P Note (Signed)
History and Physical Interval Note: ? ?09/21/2021 ?10:13 AM ? ?Amanda [name]  has presented today for surgery, with the diagnosis of critical limb ischemia - left leg ulcer.  The various methods of treatment have been discussed with the patient and family. After consideration of risks, benefits and other options for treatment, the patient has consented to  Procedure(s) with comments: ?ABDOMINAL AORTOGRAM W/LOWER EXTREMITY (N/A) ?PERIPHERAL VASCULAR BALLOON ANGIOPLASTY - Left SFA and PT as a surgical intervention.  The patient's history has been reviewed, patient examined, no change in status, stable for surgery.  I have reviewed the patient's chart and labs.  Questions were answered to the patient's satisfaction.   ? ? ?Wells Roel Douthat ? ? ?

## 2021-09-21 NOTE — Progress Notes (Signed)
PHARMACIST LIPID MONITORING ? ? ?Amanda [name] is a 76 y.o. female admitted on 09/20/2021 with left toe ulcer.  Pharmacy has been consulted to optimize lipid-lowering therapy with the indication of secondary prevention for clinical ASCVD. ? ?Recent Labs: ? ?Lipid Panel (last 6 months):   ?Lab Results  ?Component Value Date  ? CHOL 147 09/21/2021  ? TRIG 116 09/21/2021  ? HDL 52 09/21/2021  ? CHOLHDL 2.8 09/21/2021  ? VLDL 23 09/21/2021  ? [place] 72 09/21/2021  ? ? ?Hepatic function panel (last 6 months):   ?No results found for: AST, ALT, ALKPHOS, BILITOT, BILIDIR, IBILI ? ?SCr (since admission):   ?Serum creatinine: 1.79 mg/dL (H) 09/21/21 0200 ?Estimated creatinine clearance: 19 mL/min (A) ? ?Current therapy and lipid therapy tolerance ?Current lipid-lowering therapy: crestor 40mg  ?Previous lipid-lowering therapies (if applicable): n/a ?Documented or reported allergies or intolerances to lipid-lowering therapies (if applicable): n/a ? ?Assessment:   ?Patient currently on high intensity statin therapy.  ? ?Plan:   ? ?1.Statin intensity (high intensity recommended for all patients regardless of the LDL):  No statin changes. The patient is already on a high intensity statin. ? ?2.Add ezetimibe (if any one of the following):   Not indicated at this time. ? ?3.Refer to lipid clinic:   No ? ?4.Follow-up with:  Primary care provider - [name], MD ? ?5.Follow-up labs after discharge:  No changes in lipid therapy, repeat a lipid panel in one year.    ? ?Erin Hearing PharmD., BCPS ?Clinical Pharmacist ?09/21/2021 7:37 AM ? ?

## 2021-09-21 NOTE — TOC Transition Note (Signed)
Transition of Care (TOC) - CM/SW Discharge Note ?Marvetta Gibbons Therapist, sports, BSN ?Transitions of Care ?Unit 4E- RN Case Manager ?See Treatment Team for direct phone #  ? ? ?Patient Details  ?Name: Amanda [name] ?MRN: [id number] ?Date of Birth: 1945/06/09 ? ?Transition of Care (TOC) CM/SW Contact:  ?Dahlia Client, Romeo Rabon, RN ?Phone Number: ?09/21/2021, 10:23 AM ? ? ?Clinical Narrative:    ?Pt stable for transition home today, Transition of Care Department [facility]) has reviewed patient and no TOC needs have been identified at this time.  ? ?Final next level of care: Home/Self Care ?Barriers to Discharge: No Barriers Identified ? ? ?Patient Goals and CMS Choice ?  ?  ?Choice offered to / list presented to : NA ? ?Discharge Placement ?  ?           ? Home ?  ?  ?  ? ?Discharge Plan and Services ?  ?Discharge Planning Services: NA ?Post Acute Care Choice: NA          ?DME Arranged: N/A ?DME Agency: NA ?  ?  ?  ?HH Arranged: NA ?[place] Agency: NA ?  ?  ?  ? ?Social Determinants of Health (SDOH) Interventions ?  ? ? ?Readmission Risk Interventions ?   ? View : No data to display.  ?  ?  ?  ? ? ? ? ? ?

## 2021-09-21 NOTE — H&P (View-Only) (Signed)
?  Progress Note ? ? ? ?09/21/2021 ?8:28 AM ?1 Day Post-Op ? ?Subjective:  No complaints ? ? ?Vitals:  ? 09/21/21 0200 09/21/21 0404  ?BP:  111/65  ?Pulse:  80  ?Resp:  20  ?Temp:  97.7 ?F (36.5 ?C)  ?SpO2: 96% 97%  ? ?Physical Exam: ?Lungs:  non labored ?Incisions:  R groin without firm hematoma ?Extremities:  L foot warm ?Neurologic: A&O ? ?CBC ?   ?Component Value Date/Time  ? WBC 4.5 09/21/2021 0200  ? RBC 4.09 09/21/2021 0200  ? HGB 10.2 (L) 09/21/2021 0200  ? HCT 32.2 (L) 09/21/2021 0200  ? PLT 133 (L) 09/21/2021 0200  ? MCV 78.7 (L) 09/21/2021 0200  ? MCH 24.9 (L) 09/21/2021 0200  ? MCHC 31.7 09/21/2021 0200  ? RDW 13.6 09/21/2021 0200  ? LYMPHSABS 1.0 03/19/2020 1759  ? MONOABS 0.4 03/19/2020 1759  ? EOSABS 0.0 03/19/2020 1759  ? BASOSABS 0.0 03/19/2020 1759  ? ? ?BMET ?   ?Component Value Date/Time  ? NA 138 09/21/2021 0200  ? K 4.5 09/21/2021 0200  ? CL 108 09/21/2021 0200  ? CO2 25 09/21/2021 0200  ? GLUCOSE 301 (H) 09/21/2021 0200  ? BUN 29 (H) 09/21/2021 0200  ? CREATININE 1.79 (H) 09/21/2021 0200  ? CREATININE 2.25 (H) 05/03/2021 0000  ? CALCIUM 8.6 (L) 09/21/2021 0200  ? GFRNONAA 29 (L) 09/21/2021 0200  ? GFRAA 67 (L) 10/12/2011 0710  ? ? ?INR ?   ?Component Value Date/Time  ? INR 1.44 10/05/2011 1930  ? ? ? ?Intake/Output Summary (Last 24 hours) at 09/21/2021 0828 ?Last data filed at 09/21/2021 0440 ?Gross per 24 hour  ?Intake 550 ml  ?Output 2500 ml  ?Net -1950 ml  ? ? ? ?Assessment/Plan:  76 y.o. female is s/p L popliteal and tp trunk balloon angioplasty 1 Day Post-Op  ? ?Patient was kept overnight in observation due to late afternoon sheath pull.  [place] for discharge home this morning.  Follow up in 4-6 weeks with arterial duplex. ? ? ?Dagoberto Ligas, PA-C ?[facility] ?[phone] ?09/21/2021 ?8:28 AM ? ?I agree with the above.  Son not available to interpret.  Creatinine ok this am.  Access sites soft.  Plan discharge this am ? ?Wells Teanna Elem ? ?

## 2021-09-21 NOTE — Plan of Care (Signed)

## 2021-09-21 NOTE — Progress Notes (Addendum)
?  Progress Note ? ? ? ?09/21/2021 ?8:28 AM ?1 Day Post-Op ? ?Subjective:  No complaints ? ? ?Vitals:  ? 09/21/21 0200 09/21/21 0404  ?BP:  111/65  ?Pulse:  80  ?Resp:  20  ?Temp:  97.7 ?F (36.5 ?C)  ?SpO2: 96% 97%  ? ?Physical Exam: ?Lungs:  non labored ?Incisions:  R groin without firm hematoma ?Extremities:  L foot warm ?Neurologic: A&O ? ?CBC ?   ?Component Value Date/Time  ? WBC 4.5 09/21/2021 0200  ? RBC 4.09 09/21/2021 0200  ? HGB 10.2 (L) 09/21/2021 0200  ? HCT 32.2 (L) 09/21/2021 0200  ? PLT 133 (L) 09/21/2021 0200  ? MCV 78.7 (L) 09/21/2021 0200  ? MCH 24.9 (L) 09/21/2021 0200  ? MCHC 31.7 09/21/2021 0200  ? RDW 13.6 09/21/2021 0200  ? LYMPHSABS 1.0 03/19/2020 1759  ? MONOABS 0.4 03/19/2020 1759  ? EOSABS 0.0 03/19/2020 1759  ? BASOSABS 0.0 03/19/2020 1759  ? ? ?BMET ?   ?Component Value Date/Time  ? NA 138 09/21/2021 0200  ? K 4.5 09/21/2021 0200  ? CL 108 09/21/2021 0200  ? CO2 25 09/21/2021 0200  ? GLUCOSE 301 (H) 09/21/2021 0200  ? BUN 29 (H) 09/21/2021 0200  ? CREATININE 1.79 (H) 09/21/2021 0200  ? CREATININE 2.25 (H) 05/03/2021 0000  ? CALCIUM 8.6 (L) 09/21/2021 0200  ? GFRNONAA 29 (L) 09/21/2021 0200  ? GFRAA 67 (L) 10/12/2011 0710  ? ? ?INR ?   ?Component Value Date/Time  ? INR 1.44 10/05/2011 1930  ? ? ? ?Intake/Output Summary (Last 24 hours) at 09/21/2021 0828 ?Last data filed at 09/21/2021 0440 ?Gross per 24 hour  ?Intake 550 ml  ?Output 2500 ml  ?Net -1950 ml  ? ? ? ?Assessment/Plan:  76 y.o. female is s/p L popliteal and tp trunk balloon angioplasty 1 Day Post-Op  ? ?Patient was kept overnight in observation due to late afternoon sheath pull.  [place] for discharge home this morning.  Follow up in 4-6 weeks with arterial duplex. ? ? ?Dagoberto Ligas, PA-C ?[facility] ?[phone] ?09/21/2021 ?8:28 AM ? ?I agree with the above.  Son not available to interpret.  Creatinine ok this am.  Access sites soft.  Plan discharge this am ? ?Amanda [name] ? ?

## 2021-09-21 NOTE — Progress Notes (Signed)
Patient discharged home via private vehicle with son. Reviewed AVS instructions with son. ?

## 2021-09-22 LAB — POCT ACTIVATED CLOTTING TIME: Activated Clotting Time: 347 seconds

## 2021-10-12 ENCOUNTER — Telehealth: Payer: Self-pay

## 2021-10-12 NOTE — Telephone Encounter (Signed)
Pt's son called with c/o ongoing swelling since AGM. Pt has not been elevating leg at all since procedure. We discussed importance of this and how it will help with swelling. Additionally, her toe wounds have started oozing clear liquid. She is without fever. He is aware to call back if this drainage is yellow/green etc. Pt verbalized understanding of the above. Her f/u is scheduled for next week.

## 2021-10-15 ENCOUNTER — Other Ambulatory Visit: Payer: Self-pay

## 2021-10-15 DIAGNOSIS — I70248 Atherosclerosis of native arteries of left leg with ulceration of other part of lower left leg: Secondary | ICD-10-CM

## 2021-10-24 ENCOUNTER — Ambulatory Visit (INDEPENDENT_AMBULATORY_CARE_PROVIDER_SITE_OTHER): Payer: Medicare Other | Admitting: Surgery

## 2021-10-24 ENCOUNTER — Ambulatory Visit (INDEPENDENT_AMBULATORY_CARE_PROVIDER_SITE_OTHER)
Admit: 2021-10-24 | Discharge: 2021-10-24 | Disposition: A | Discharge status: Home / Self Care | Payer: Medicare Other | Attending: Surgery | Admitting: Surgery

## 2021-10-24 ENCOUNTER — Encounter: Payer: Self-pay | Admitting: Surgery

## 2021-10-24 ENCOUNTER — Ambulatory Visit (HOSPITAL_COMMUNITY)
Admission: RE | Admit: 2021-10-24 | Discharge: 2021-10-24 | Disposition: A | Discharge status: Home / Self Care | Payer: Medicare Other | Source: Ambulatory Visit | Attending: Surgery | Admitting: Surgery

## 2021-10-24 VITALS — BP 156/77 | HR 55 | Temp 97.7°F | Resp 20 | Ht <= 58 in | Wt 129.6 lb

## 2021-10-24 DIAGNOSIS — I70248 Atherosclerosis of native arteries of left leg with ulceration of other part of lower left leg: Secondary | ICD-10-CM | POA: Insufficient documentation

## 2021-10-24 DIAGNOSIS — L97509 Non-pressure chronic ulcer of other part of unspecified foot with unspecified severity: Secondary | ICD-10-CM

## 2021-10-24 DIAGNOSIS — I7025 Atherosclerosis of native arteries of other extremities with ulceration: Secondary | ICD-10-CM | POA: Diagnosis not present

## 2021-10-24 NOTE — Progress Notes (Signed)
[facility]  Patient name: Amanda [name] MRN: [id number] DOB: [birth year] Sex: female   REASON FOR VISIT:    Follow up  [place]:    Amanda [name] is a 76 y.o. female who I initially saw in April 2023 for evaluation of left second and third toe wounds.  These were being treated with mupirocin and were very painful.  I could not palpate femoral pulses and so I wanted to get a CT scan.  Unfortunately her creatinine was elevated and this could not be done and so I scheduled her for angiography which was performed on 09/20/2021.  I found her left popliteal, tibioperoneal trunk, and proximal peroneal artery were occluded.  I was able to successfully cross these and treat them with a drug-coated balloon.  Her son is acting as a Optometrist.  The wound appears to be improved, and her pain appears to be less.  Patient is a non-smoker.  She is medically managed for hypertension.  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  She is diabetic PAST MEDICAL HISTORY:   Past Medical History:  Diagnosis Date   Abdominal pain    Chest pain    Atypical   Diabetes mellitus    GERD (gastroesophageal reflux disease)    Hyperlipidemia    Hypertension      FAMILY HISTORY:   Family History  Family history unknown: Yes    SOCIAL HISTORY:   Social History   Tobacco Use   Smoking status: Never   Smokeless tobacco: Never  Substance Use Topics   Alcohol use: No     ALLERGIES:   No Known Allergies   CURRENT MEDICATIONS:   Current Outpatient Medications  Medication Sig Dispense Refill   amLODipine (NORVASC) 10 MG tablet Take 10 mg by mouth daily.     aspirin EC 81 MG tablet Take 81 mg by mouth daily. Swallow whole.     clopidogrel (PLAVIX) 75 MG tablet Take 1 tablet (75 mg total) by mouth daily. 30 tablet 11   diclofenac Sodium (VOLTAREN) 1 % GEL Apply 1 application topically 4 (four) times daily  as needed (pain).      doxycycline (VIBRA-TABS) 100 MG tablet Take 1 tablet (100 mg total) by mouth 2 (two) times daily. 20 tablet 0   HUMALOG KWIKPEN 100 UNIT/ML KwikPen Inject 2-15 Units into the skin 3 (three) times daily as needed for high blood sugar.     insulin glargine, 2 Unit Dial, (TOUJEO MAX SOLOSTAR) 300 UNIT/ML Solostar Pen Inject 20 Units into the skin every evening.     JARDIANCE 10 MG TABS tablet Take 10 mg by mouth daily.     lidocaine-prilocaine (EMLA) cream Apply 1 application. topically as needed. Apply to toes as needed for pain 30 g 3   metoprolol tartrate (LOPRESSOR) 25 MG tablet Take 0.5 tablets (12.5 mg total) by mouth 2 (two) times daily. (Patient taking differently: Take 25 mg by mouth 2 (two) times daily.) 60 tablet 0   mupirocin ointment (BACTROBAN) 2 % Apply 1 application topically 2 (two) times daily. Apply to left second toe and left heel twice daily and cover 30 g 2   pantoprazole (PROTONIX) 40 MG tablet Take 1 tablet (40 mg total) by mouth daily. 30 tablet 0   pregabalin (LYRICA) 50 MG capsule Take 50 mg by mouth 2 (two) times daily.     traMADol (ULTRAM) 50 MG tablet Take 1 tablet (50 mg total) by mouth every  6 (six) hours as needed for moderate pain. 30 tablet 0   triamcinolone cream (KENALOG) 0.1 % Apply 1 application. topically 2 (two) times daily as needed for dry skin.     rosuvastatin (CRESTOR) 40 MG tablet Take 1 tablet (40 mg total) by mouth daily. 90 tablet 3   No current facility-administered medications for this visit.    REVIEW OF SYSTEMS:   [X]  denotes positive finding, [ ]  denotes negative finding Cardiac  Comments:  Chest pain or chest pressure:    Shortness of breath upon exertion:    Short of breath when lying flat:    Irregular heart rhythm:        Vascular    Pain in calf, thigh, or hip brought on by ambulation:    Pain in feet at night that wakes you up from your sleep:     Blood clot in your veins:    Leg swelling:          Pulmonary    Oxygen at home:    Productive cough:     Wheezing:         Neurologic    Sudden weakness in arms or legs:     Sudden numbness in arms or legs:     Sudden onset of difficulty speaking or slurred speech:    Temporary loss of vision in one eye:     Problems with dizziness:         Gastrointestinal    Blood in stool:     Vomited blood:         Genitourinary    Burning when urinating:     Blood in urine:        Psychiatric    Major depression:         Hematologic    Bleeding problems:    Problems with blood clotting too easily:        Skin    Rashes or ulcers:        Constitutional    Fever or chills:      PHYSICAL EXAM:   Vitals:   10/24/21 1405  BP: (!) 156/77  Pulse: (!) 55  Resp: 20  Temp: 97.7 F (36.5 C)  SpO2: 95%  Weight: 129 lb 9.6 oz (58.8 kg)  Height: 4\' 9"  (1.448 m)    GENERAL: The patient is a well-nourished female, in no acute distress. The vital signs are documented above. CARDIAC: There is a regular rate and rhythm.  VASCULAR: Nonpalpable pedal pulses PULMONARY: Non-labored respirations MUSCULOSKELETAL: There are no major deformities or cyanosis. NEUROLOGIC: No focal weakness or paresthesias are detected. SKIN: The tips of toes left 2 and 3 are more dry without obvious evidence of infection.  The wounds do appear to be smaller. PSYCHIATRIC: The patient has a normal affect.  STUDIES:   I have reviewed the following: +-------+-----------+-----------+------------+------------+  ABI/TBIToday's ABIToday's TBIPrevious ABIPrevious TBI  +-------+-----------+-----------+------------+------------+  Right  0.62       0.34       0.6         0.24          +-------+-----------+-----------+------------+------------+  Left   0.86       0.25       0.63        0             +-------+-----------+-----------+------------+------------+      Right toe pressure = 48 Left toe pressure = 35   Left: Patent left leg arterial  system with no areas of stenosis seen.  Waveforms appear to be monophasic beginning in the left superficial femoral artery  MEDICAL ISSUES:   Atherosclerotic lower extremity vascular disease with left toe ulcer: Patient is undergone revascularization.  Intervention appears to be widely patent without stenosis, however the waveforms are monophasic.  Her ABIs have improved.  Clinically she has improved.  I am going to to have them come back in 3 months for repeat ultrasound and to evaluate the wound.  If she has not completely healed, we will need to consider repeating angiography.  I am also sending her back to [facility] to help with wound care.    Leia Alf, MD, FACS [facility] [phone] Pager [phone]

## 2021-10-26 ENCOUNTER — Encounter (HOSPITAL_COMMUNITY): Payer: Medicare Other

## 2021-10-27 ENCOUNTER — Other Ambulatory Visit: Payer: Self-pay | Admitting: *Deleted

## 2021-10-27 DIAGNOSIS — I7025 Atherosclerosis of native arteries of other extremities with ulceration: Secondary | ICD-10-CM

## 2021-10-27 DIAGNOSIS — I70248 Atherosclerosis of native arteries of left leg with ulceration of other part of lower left leg: Secondary | ICD-10-CM

## 2021-10-28 ENCOUNTER — Ambulatory Visit (INDEPENDENT_AMBULATORY_CARE_PROVIDER_SITE_OTHER): Payer: Medicare Other | Admitting: Podiatry

## 2021-10-28 ENCOUNTER — Encounter: Payer: Self-pay | Admitting: Podiatry

## 2021-10-28 DIAGNOSIS — L97522 Non-pressure chronic ulcer of other part of left foot with fat layer exposed: Secondary | ICD-10-CM | POA: Diagnosis not present

## 2021-10-28 DIAGNOSIS — I739 Peripheral vascular disease, unspecified: Secondary | ICD-10-CM

## 2021-10-28 NOTE — Progress Notes (Signed)
Subjective:  Patient ID: Amanda [name], female    DOB: 11-Oct-1945,  MRN: [id number]  Chief Complaint  Patient presents with   Foot Ulcer    76 y.o. female presents for wound care.  Patient presents with left second digit ulceration.  Patient states is been present for quite some time now.  It was being treated by Dr. Rolley [name].  Patient has history of peripheral vascular disease for which they did a procedure.  She denies any other acute complaints.  She is here with her son who is translating she is a diabetic with last A1c of 12.6%   The left common femoral and profundofemoral artery are widely patent.  The superficial femoral artery is diffusely diseased and relatively small in caliber measuring about 4-5 mm.  There is a occlusion of the popliteal artery at the level of the joint space.  There is single-vessel runoff via the peroneal artery which reconstitutes near its origin.  This collateralizes out onto the foot.  The foot is being primarily collateralized by peroneal artery  Review of Systems: Negative except as noted in the HPI. Denies N/V/F/Ch.  Past Medical History:  Diagnosis Date   Abdominal pain    Chest pain    Atypical   Diabetes mellitus    GERD (gastroesophageal reflux disease)    Hyperlipidemia    Hypertension     Current Outpatient Medications:    amLODipine (NORVASC) 10 MG tablet, Take 10 mg by mouth daily., Disp: , Rfl:    aspirin EC 81 MG tablet, Take 81 mg by mouth daily. Swallow whole., Disp: , Rfl:    clopidogrel (PLAVIX) 75 MG tablet, Take 1 tablet (75 mg total) by mouth daily., Disp: 30 tablet, Rfl: 11   diclofenac Sodium (VOLTAREN) 1 % GEL, Apply 1 application topically 4 (four) times daily as needed (pain). , Disp: , Rfl:    doxycycline (VIBRA-TABS) 100 MG tablet, Take 1 tablet (100 mg total) by mouth 2 (two) times daily., Disp: 20 tablet, Rfl: 0   HUMALOG KWIKPEN 100 UNIT/ML KwikPen, Inject 2-15 Units into the skin 3 (three) times daily as needed for  high blood sugar., Disp: , Rfl:    insulin glargine, 2 Unit Dial, (TOUJEO MAX SOLOSTAR) 300 UNIT/ML Solostar Pen, Inject 20 Units into the skin every evening., Disp: , Rfl:    JARDIANCE 10 MG TABS tablet, Take 10 mg by mouth daily., Disp: , Rfl:    lidocaine-prilocaine (EMLA) cream, Apply 1 application. topically as needed. Apply to toes as needed for pain, Disp: 30 g, Rfl: 3   metoprolol tartrate (LOPRESSOR) 25 MG tablet, Take 0.5 tablets (12.5 mg total) by mouth 2 (two) times daily. (Patient taking differently: Take 25 mg by mouth 2 (two) times daily.), Disp: 60 tablet, Rfl: 0   mupirocin ointment (BACTROBAN) 2 %, Apply 1 application topically 2 (two) times daily. Apply to left second toe and left heel twice daily and cover, Disp: 30 g, Rfl: 2   pantoprazole (PROTONIX) 40 MG tablet, Take 1 tablet (40 mg total) by mouth daily., Disp: 30 tablet, Rfl: 0   pregabalin (LYRICA) 50 MG capsule, Take 50 mg by mouth 2 (two) times daily., Disp: , Rfl:    rosuvastatin (CRESTOR) 40 MG tablet, Take 1 tablet (40 mg total) by mouth daily., Disp: 90 tablet, Rfl: 3   traMADol (ULTRAM) 50 MG tablet, Take 1 tablet (50 mg total) by mouth every 6 (six) hours as needed for moderate pain., Disp: 30 tablet, Rfl: 0  triamcinolone cream (KENALOG) 0.1 %, Apply 1 application. topically 2 (two) times daily as needed for dry skin., Disp: , Rfl:   Social History   Tobacco Use  Smoking Status Never  Smokeless Tobacco Never    No Known Allergies Objective:  There were no vitals filed for this visit. There is no height or weight on file to calculate BMI. Constitutional Well developed. Well nourished.  Vascular Dorsalis pedis pulses palpable bilaterally. Posterior tibial pulses palpable bilaterally. Capillary refill normal to all digits.  No cyanosis or clubbing noted. Pedal hair growth normal.  Neurologic Normal speech. Oriented to person, place, and time. Protective sensation absent  Dermatologic Wound Location:  Left second digit ulceration with fat layer exposed.  Does not probe down to bone.  Fibrogranular wound noted. Wound Base: Mixed Granular/Fibrotic Peri-wound: Macerated Exudate: Scant/small amount Serous exudate Wound Measurements: -See below  Orthopedic: No pain to palpation either foot.   Radiographs: See below Assessment:  No diagnosis found. Plan:  Patient was evaluated and treated and all questions answered.  Ulcer left second digit ulceration with fat layer exposed base -Minimal debridement as below. -Dressed with Betadine wet-to-dry, DSD. -Continue off-loading with surgical shoe.  Peripheral vascular disease -Being clinically followed by vascular surgery.  Patient single-vessel runoff via peroneal artery near its origin.  Procedure: Excisional Debridement of Wound Tool: Sharp chisel blade/tissue nipper Rationale: Removal of non-viable soft tissue from the wound to promote healing.  Anesthesia: none Pre-Debridement Wound Measurements: 0.5 cm x 0.3 cm x 0.3 cm Post-Debridement Wound Measurements: 0.6 cm x 0.3 cm x 0.3 cm Type of Debridement: Sharp Excisional Tissue Removed: Non-viable soft tissue Blood loss: Minimal (<50cc) Depth of Debridement: subcutaneous tissue. Technique: Sharp excisional debridement to bleeding, viable wound base.  Wound Progress: This is my initial evaluation I will continue to monitor the progression of it. Site healing conversation 7 Dressing: Dry, sterile, compression dressing. Disposition: Patient tolerated procedure well. Patient to return in 1 week for follow-up.  Return in about 4 weeks (around 11/25/2021) for Amanda [name].

## 2021-11-23 ENCOUNTER — Ambulatory Visit: Payer: Medicare Other | Admitting: Podiatry

## 2021-12-10 IMAGING — DX DG CHEST 1V PORT
1 series · 1 of 1 positions shown · non-contrast
Comparison: 02/22/2020

CLINICAL DATA: Shortness of breath

EXAM:
PORTABLE CHEST 1 VIEW

[chest ap]
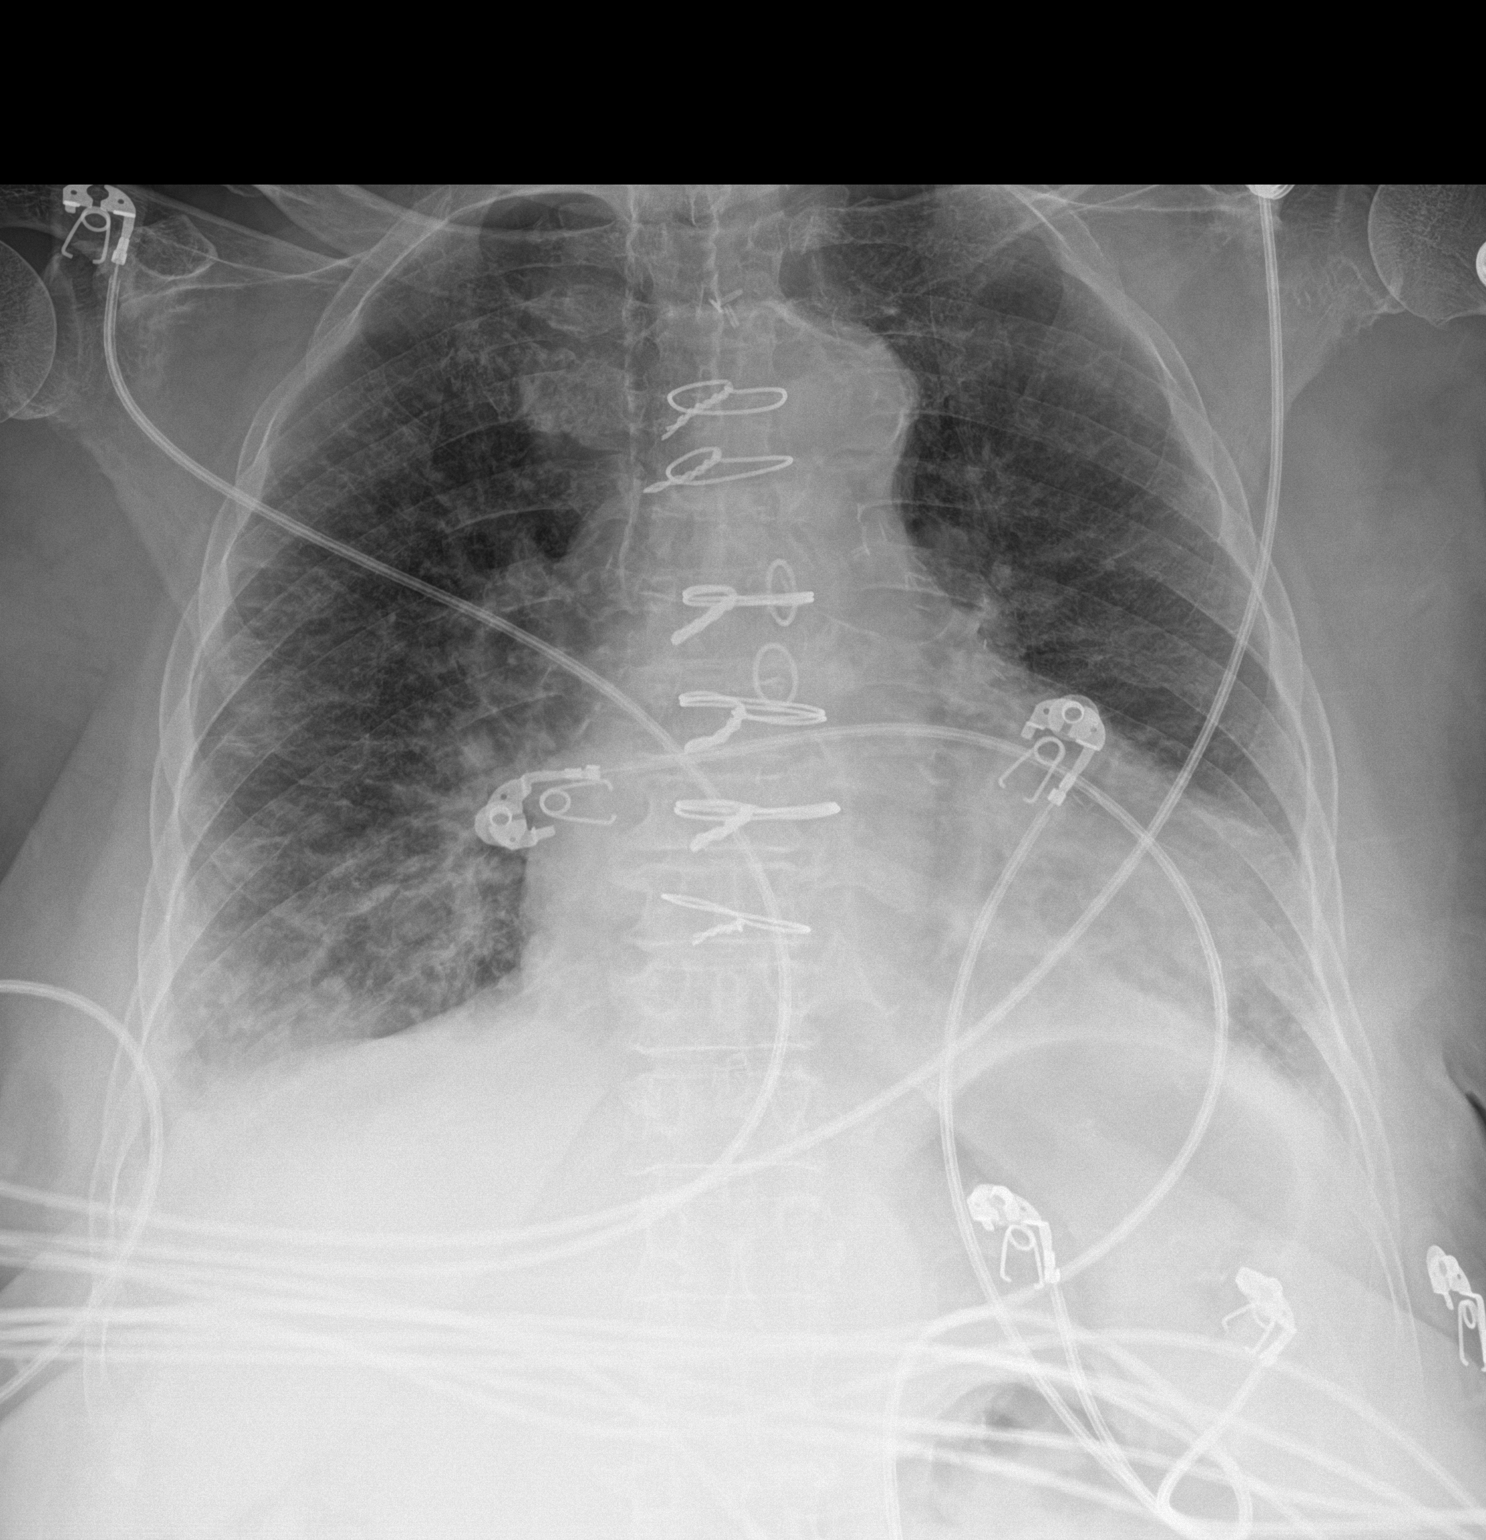

[1 of 1 positions shown; findings below may reference images not displayed]

FINDINGS: Cardiac shadow is enlarged but stable. Postsurgical changes are
again seen. Aortic calcifications are noted. The lungs are well
aerated bilaterally. Patchy right basilar infiltrate is seen. Some
mild interstitial changes are noted which may represent mild edema.
No bony abnormality is seen.
IMPRESSION: Changes of mild edema and new right basilar infiltrate.

## 2021-12-13 IMAGING — DX DG CHEST 1V PORT
1 series · 1 of 1 positions shown · non-contrast
Comparison: 03/19/2020

CLINICAL DATA: Shortness of breath

EXAM:
PORTABLE CHEST 1 VIEW

[chest]
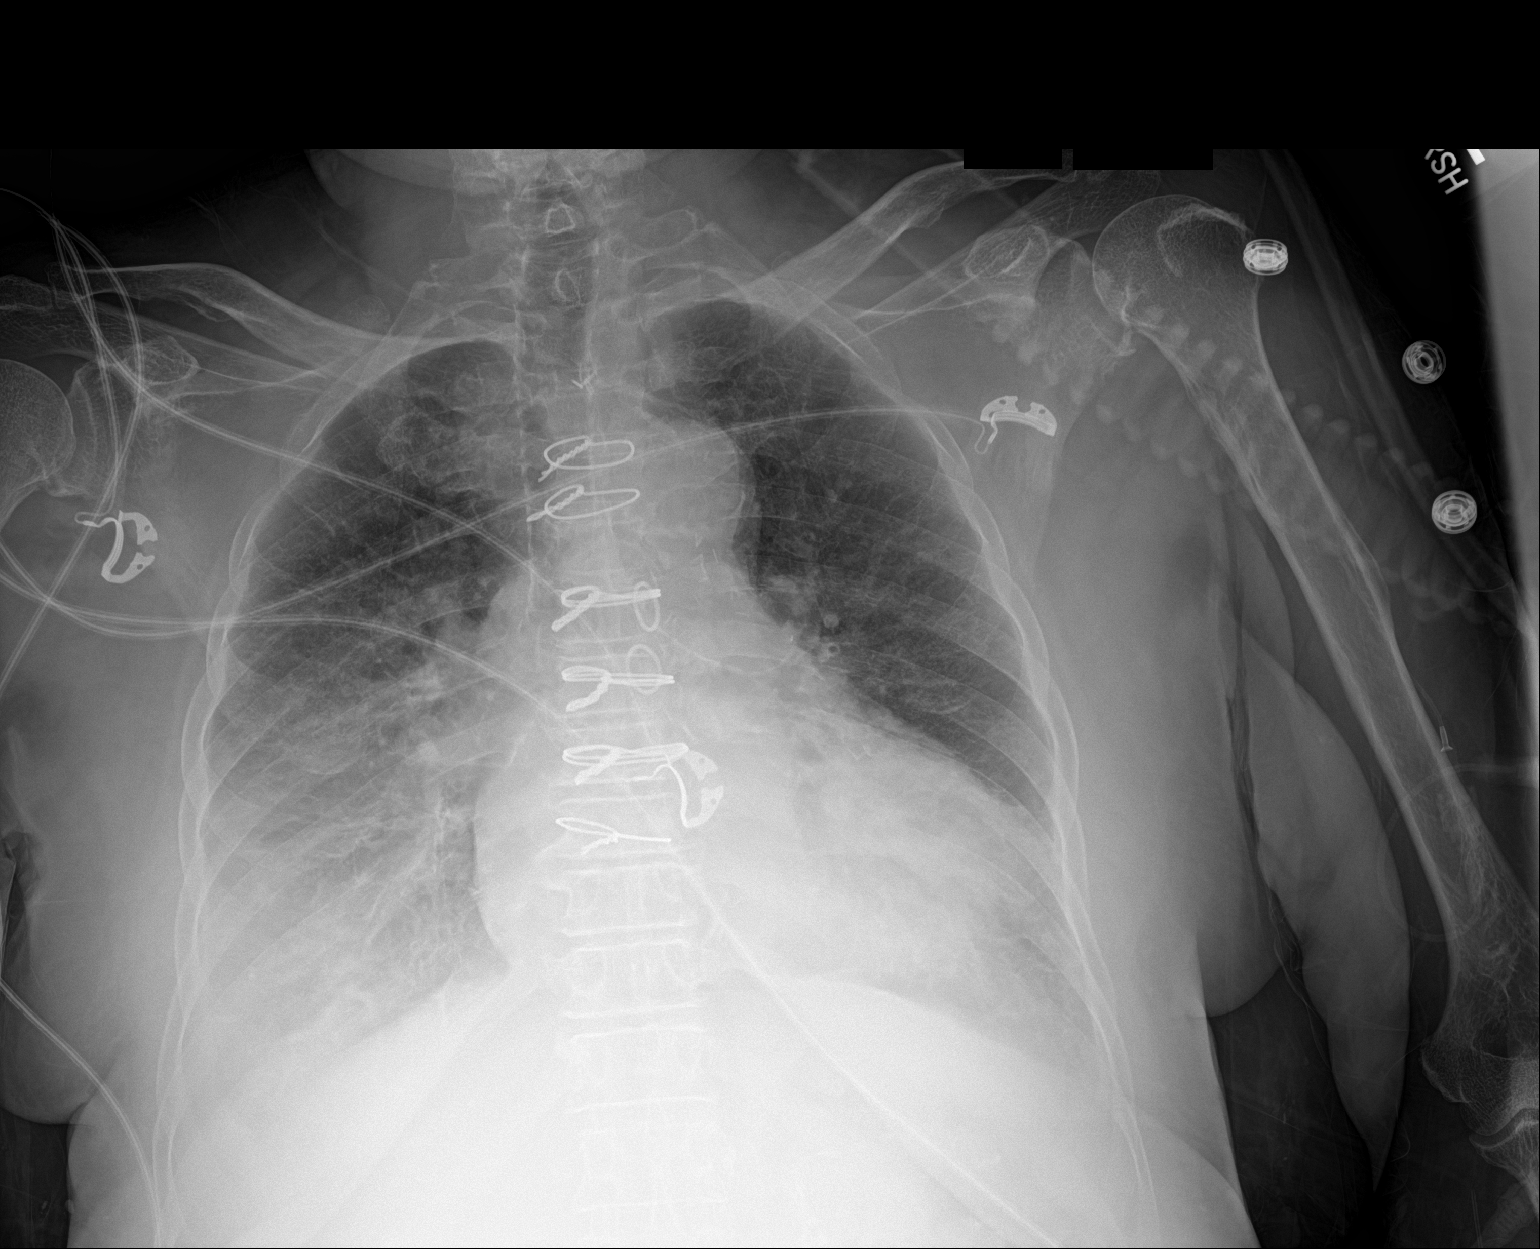

[1 of 1 positions shown; findings below may reference images not displayed]

FINDINGS: Cardiac shadow is mildly enlarged but stable. Postsurgical changes
are again seen and stable. Increasing right-sided pleural effusion
is noted. Underlying edema is seen. Small left effusion is noted as
well. No focal infiltrate is noted.
IMPRESSION: Increasing effusions right greater than left.

Mild underlying edema is noted.

## 2021-12-28 ENCOUNTER — Ambulatory Visit (INDEPENDENT_AMBULATORY_CARE_PROVIDER_SITE_OTHER): Payer: Medicare Other | Admitting: Podiatry

## 2021-12-28 DIAGNOSIS — I739 Peripheral vascular disease, unspecified: Secondary | ICD-10-CM

## 2021-12-28 DIAGNOSIS — L97522 Non-pressure chronic ulcer of other part of left foot with fat layer exposed: Secondary | ICD-10-CM | POA: Diagnosis not present

## 2021-12-28 MED ORDER — DOXYCYCLINE HYCLATE 100 MG PO TABS: 100.0000 mg | ORAL_TABLET | Freq: Two times a day (BID) | ORAL | 0 refills | Status: AC

## 2021-12-28 NOTE — Progress Notes (Signed)
Subjective:  Patient ID: Amanda [name], female    DOB: 1945/06/16,  MRN: [id number]  Chief Complaint  Patient presents with   Follow-up     4 week follow-up    Foot Ulcer    Follow-up for ulcer left foot    76 y.o. female presents for wound care.  Patient presents with left second digit ulceration.  Left second toe has completely reepithelialized.  No signs of breakdown noted.  No other ulceration or sores noted.  The left common femoral and profundofemoral artery are widely patent.  The superficial femoral artery is diffusely diseased and relatively small in caliber measuring about 4-5 mm.  There is a occlusion of the popliteal artery at the level of the joint space.  There is single-vessel runoff via the peroneal artery which reconstitutes near its origin.  This collateralizes out onto the foot.  The foot is being primarily collateralized by peroneal artery  Review of Systems: Negative except as noted in the HPI. Denies N/V/F/Ch.  Past Medical History:  Diagnosis Date   Abdominal pain    Chest pain    Atypical   Diabetes mellitus    GERD (gastroesophageal reflux disease)    Hyperlipidemia    Hypertension     Current Outpatient Medications:    amLODipine (NORVASC) 10 MG tablet, Take 10 mg by mouth daily., Disp: , Rfl:    aspirin EC 81 MG tablet, Take 81 mg by mouth daily. Swallow whole., Disp: , Rfl:    clopidogrel (PLAVIX) 75 MG tablet, Take 1 tablet (75 mg total) by mouth daily., Disp: 30 tablet, Rfl: 11   diclofenac Sodium (VOLTAREN) 1 % GEL, Apply 1 application topically 4 (four) times daily as needed (pain). , Disp: , Rfl:    doxycycline (VIBRA-TABS) 100 MG tablet, Take 1 tablet (100 mg total) by mouth 2 (two) times daily., Disp: 20 tablet, Rfl: 0   doxycycline (VIBRA-TABS) 100 MG tablet, Take 1 tablet (100 mg total) by mouth 2 (two) times daily., Disp: 20 tablet, Rfl: 0   HUMALOG KWIKPEN 100 UNIT/ML KwikPen, Inject 2-15 Units into the skin 3 (three) times daily as  needed for high blood sugar., Disp: , Rfl:    insulin glargine, 2 Unit Dial, (TOUJEO MAX SOLOSTAR) 300 UNIT/ML Solostar Pen, Inject 20 Units into the skin every evening., Disp: , Rfl:    JARDIANCE 10 MG TABS tablet, Take 10 mg by mouth daily., Disp: , Rfl:    lidocaine-prilocaine (EMLA) cream, Apply 1 application. topically as needed. Apply to toes as needed for pain, Disp: 30 g, Rfl: 3   metoprolol tartrate (LOPRESSOR) 25 MG tablet, Take 0.5 tablets (12.5 mg total) by mouth 2 (two) times daily. (Patient taking differently: Take 25 mg by mouth 2 (two) times daily.), Disp: 60 tablet, Rfl: 0   mupirocin ointment (BACTROBAN) 2 %, Apply 1 application topically 2 (two) times daily. Apply to left second toe and left heel twice daily and cover, Disp: 30 g, Rfl: 2   pantoprazole (PROTONIX) 40 MG tablet, Take 1 tablet (40 mg total) by mouth daily., Disp: 30 tablet, Rfl: 0   pregabalin (LYRICA) 50 MG capsule, Take 50 mg by mouth 2 (two) times daily., Disp: , Rfl:    traMADol (ULTRAM) 50 MG tablet, Take 1 tablet (50 mg total) by mouth every 6 (six) hours as needed for moderate pain., Disp: 30 tablet, Rfl: 0   triamcinolone cream (KENALOG) 0.1 %, Apply 1 application. topically 2 (two) times daily as needed for dry skin.,  Disp: , Rfl:    rosuvastatin (CRESTOR) 40 MG tablet, Take 1 tablet (40 mg total) by mouth daily., Disp: 90 tablet, Rfl: 3  Social History   Tobacco Use  Smoking Status Never  Smokeless Tobacco Never    No Known Allergies Objective:  There were no vitals filed for this visit. There is no height or weight on file to calculate BMI. Constitutional Well developed. Well nourished.  Vascular Dorsalis pedis pulses palpable bilaterally. Posterior tibial pulses palpable bilaterally. Capillary refill normal to all digits.  No cyanosis or clubbing noted. Pedal hair growth normal.  Neurologic Normal speech. Oriented to person, place, and time. Protective sensation absent  Dermatologic  Clinically healed and reepithelialized.  No further signs of breakdown noted.  Orthopedic: No pain to palpation either foot.   Radiographs: See below Assessment:   1. PAD (peripheral artery disease) ([place])   2. Ulcer of left second toe, with fat layer exposed ([place])    Plan:  Patient was evaluated and treated and all questions answered.  Ulcer left second digit ulceration with fat layer exposed base -Clinically healed.  Completely reepithelialized.  Patient no further signs of breakdown noted.  If any foot and ankle issues on future advised her to come back and see me.  She states understanding.  I discussed prevention technique and shoe gear modification.  She states understanding  Peripheral vascular disease -Being clinically followed by vascular surgery.  Patient single-vessel runoff via peroneal artery near its origin.    No follow-ups on file.

## 2022-01-30 ENCOUNTER — Ambulatory Visit (HOSPITAL_COMMUNITY): Payer: Medicare Other | Attending: Surgery

## 2022-01-30 ENCOUNTER — Ambulatory Visit (HOSPITAL_COMMUNITY): Payer: Medicare Other

## 2022-01-30 ENCOUNTER — Ambulatory Visit: Payer: Medicare Other

## 2022-04-05 ENCOUNTER — Ambulatory Visit: Payer: Medicare Other | Admitting: Podiatry

## 2022-05-10 ENCOUNTER — Ambulatory Visit (INDEPENDENT_AMBULATORY_CARE_PROVIDER_SITE_OTHER): Payer: Medicare Other | Admitting: Podiatry

## 2022-05-10 DIAGNOSIS — M79675 Pain in left toe(s): Secondary | ICD-10-CM

## 2022-05-10 DIAGNOSIS — M79674 Pain in right toe(s): Secondary | ICD-10-CM | POA: Diagnosis not present

## 2022-05-10 DIAGNOSIS — B351 Tinea unguium: Secondary | ICD-10-CM | POA: Diagnosis not present

## 2022-05-10 DIAGNOSIS — I739 Peripheral vascular disease, unspecified: Secondary | ICD-10-CM

## 2022-05-10 MED ORDER — CYCLOBENZAPRINE HCL 10 MG PO TABS: 10.0000 mg | ORAL_TABLET | Freq: Three times a day (TID) | ORAL | 0 refills | Status: AC | PRN

## 2022-05-10 NOTE — Progress Notes (Signed)
  Subjective:  Patient ID: Amanda [name], female    DOB: [birth year],  MRN: [id number]  Chief Complaint  Patient presents with   Nail Problem    Nail trim    76 y.o. female returns for the above complaint.  Patient presents with thickened elongated dystrophic toenails x 10 mild pain on palpation hurts with ambulation worse with pressure.  Objective:  There were no vitals filed for this visit. Podiatric Exam: Vascular: dorsalis pedis and posterior tibial pulses are nonpalapble bilateral. Capillary return is immediate. Temperature gradient is WNL. Skin turgor WNL  Sensorium: Normal Semmes Weinstein monofilament test. Normal tactile sensation bilaterally. Nail Exam: Pt has thick disfigured discolored nails with subungual debris noted bilateral entire nail hallux through fifth toenails.  Pain on palpation to the nails. Ulcer Exam: There is no evidence of ulcer or pre-ulcerative changes or infection. Orthopedic Exam: Muscle tone and strength are WNL. No limitations in general ROM. No crepitus or effusions noted.  Skin: No Porokeratosis. No infection or ulcers    Assessment & Plan:  No diagnosis found.  Patient was evaluated and treated and all questions answered.  Peripheral vascular disease -She will be followed both by vascular  Onychomycosis with pain  -Nails palliatively debrided as below. -Educated on self-care  Procedure: Nail Debridement Rationale: pain  Type of Debridement: manual, sharp debridement. Instrumentation: Nail nipper, rotary burr. Number of Nails: 10  Procedures and Treatment: Consent by patient was obtained for treatment procedures. The patient understood the discussion of treatment and procedures well. All questions were answered thoroughly reviewed. Debridement of mycotic and hypertrophic toenails, 1 through 5 bilateral and clearing of subungual debris. No ulceration, no infection noted.  Return Visit-Office Procedure: Patient instructed to return to the  office for a follow up visit 3 months for continued evaluation and treatment.  Boneta Lucks, DPM    No follow-ups on file.

## 2022-05-12 ENCOUNTER — Ambulatory Visit: Payer: Medicare Other | Admitting: Podiatry

## 2022-05-23 ENCOUNTER — Telehealth: Payer: Self-pay | Admitting: Podiatry

## 2022-05-23 NOTE — Telephone Encounter (Signed)
Pts son called very upset. States its been two weeks they have been waiting on medication to be filled. States he has left mutliple message and spoke to [facility] but medication needed a prior authorization and its taking this long to get this done. I spoke to [place] today and it was confirmed that the prior authorization was in [email or web address] and she will take care of getting the prior auth done today. Pt is aware and will check with pharmacy later today for medication.

## 2022-07-09 NOTE — Progress Notes (Unsigned)
Cardiology Office Note:   Date:  07/10/2022  NAME:  Amanda [name]    MRN: [id number] DOB:  July 29, 1945   PCP:  [name], MD  Cardiologist:  None  Electrophysiologist:  None   Referring MD: [name], MD   Chief Complaint  Patient presents with   Coronary Artery Disease         History of Present Illness:   Amanda [name] is a 77 y.o. female with a hx of CAD s/p CABG, PAD, HTN, HLD, DM who is being seen today for the evaluation of CAD at the request of [name], MD. she presents with her son.  For the past several months she has been more short of breath with activity.  The patient does not complain of any symptoms but he notices that she is quite winded.  She reports no pain or pressure.  No tightness in her chest.  They have just noticed that she is more short of breath.  Her EKG demonstrates sinus rhythm with an inferior infarct.  She does have anterolateral T wave inversions.  Her EKG is unchanged from 2023.  She remains on aspirin and Plavix.  Had recent peripheral intervention.  She does have uncontrolled diabetes with an A1c of 12.6.  Lipids seem to be close to goal at 72.  Her blood pressure is normal.  She has no signs of volume overload.  She has noticeable wheezing on examination.  We discussed a chest x-ray.  She also needs an echo and stress test.  Her bypass surgery was over 10 years ago.  No significant murmurs on examination.  She does not smoke.  No alcohol or drug use.  She is widowed.  She had 7 children.  Her weights are stable.  No signs of congestive heart failure examination.  Needs reevaluation.  Problem List CAD  -CABG  2013 2. PAD -L Tibioperoneal balloon angioplasty  -L popliteal/SFA angioplasty  3. 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 HTN 6. HLD -T chol 147, HDL 52, LDL 72, TG 116  Past Medical History: Past Medical History:  Diagnosis Date   Abdominal pain    Chest pain    Atypical   Chronic kidney disease    Coronary artery  disease    Diabetes mellitus    GERD (gastroesophageal reflux disease)    Hyperlipidemia    Hypertension     Past Surgical History: Past Surgical History:  Procedure Laterality Date   ABDOMINAL AORTOGRAM W/LOWER EXTREMITY N/A 09/20/2021   Procedure: ABDOMINAL AORTOGRAM W/LOWER EXTREMITY;  Surgeon: Serafina Mitchell, MD;  Location: [facility];  Service: Cardiovascular;  Laterality: N/A;   CARDIAC SURGERY     CORONARY ARTERY BYPASS GRAFT  10/04/2011   CORONARY ARTERY BYPASS GRAFT  10/05/2011   Procedure: CORONARY ARTERY BYPASS GRAFTING (CABG);  Surgeon: Ivin Poot, MD;  Location: [facility];  Service: Open Heart Surgery;  Laterality: N/A;  TEE   LEFT HEART CATHETERIZATION WITH CORONARY ANGIOGRAM N/A 10/04/2011   Procedure: LEFT HEART CATHETERIZATION WITH CORONARY ANGIOGRAM;  Surgeon: Sherren Mocha, MD;  Location: [facility];  Service: Cardiovascular;  Laterality: N/A;   PERIPHERAL VASCULAR BALLOON ANGIOPLASTY  09/20/2021   Procedure: PERIPHERAL VASCULAR BALLOON ANGIOPLASTY;  Surgeon: Serafina Mitchell, MD;  Location: [facility];  Service: Cardiovascular;;  Left SFA and PT    Current Medications: Current Meds  Medication Sig   amLODipine (NORVASC) 10 MG tablet Take 10 mg by mouth daily.  aspirin EC 81 MG tablet Take 81 mg by mouth daily. Swallow whole.   clopidogrel (PLAVIX) 75 MG tablet Take 1 tablet (75 mg total) by mouth daily.   cyclobenzaprine (FLEXERIL) 10 MG tablet Take 1 tablet (10 mg total) by mouth 3 (three) times daily as needed for muscle spasms.   diclofenac Sodium (VOLTAREN) 1 % GEL Apply 1 application topically 4 (four) times daily as needed (pain).    doxycycline (VIBRA-TABS) 100 MG tablet Take 1 tablet (100 mg total) by mouth 2 (two) times daily.   doxycycline (VIBRA-TABS) 100 MG tablet Take 1 tablet (100 mg total) by mouth 2 (two) times daily.   HUMALOG KWIKPEN 100 UNIT/ML KwikPen Inject 2-15 Units into the skin 3 (three) times daily as needed for high blood  sugar.   insulin glargine, 2 Unit Dial, (TOUJEO MAX SOLOSTAR) 300 UNIT/ML Solostar Pen Inject 20 Units into the skin every evening.   JARDIANCE 10 MG TABS tablet Take 10 mg by mouth daily.   lidocaine-prilocaine (EMLA) cream Apply 1 application. topically as needed. Apply to toes as needed for pain   metoprolol tartrate (LOPRESSOR) 25 MG tablet Take 0.5 tablets (12.5 mg total) by mouth 2 (two) times daily. (Patient taking differently: Take 25 mg by mouth 2 (two) times daily.)   mupirocin ointment (BACTROBAN) 2 % Apply 1 application topically 2 (two) times daily. Apply to left second toe and left heel twice daily and cover   pantoprazole (PROTONIX) 40 MG tablet Take 1 tablet (40 mg total) by mouth daily.   pregabalin (LYRICA) 50 MG capsule Take 50 mg by mouth 2 (two) times daily.   traMADol (ULTRAM) 50 MG tablet Take 1 tablet (50 mg total) by mouth every 6 (six) hours as needed for moderate pain.   triamcinolone cream (KENALOG) 0.1 % Apply 1 application. topically 2 (two) times daily as needed for dry skin.     Allergies:    Patient has no known allergies.   Social History: Social History   Socioeconomic History   Marital status: Widowed    Spouse name: Not on file   Number of children: 7   Years of education: Not on file   Highest education level: Not on file  Occupational History   Not on file  Tobacco Use   Smoking status: Never   Smokeless tobacco: Never  Vaping Use   Vaping Use: Never used  Substance and Sexual Activity   Alcohol use: No   Drug use: No   Sexual activity: Not on file  Other Topics Concern   Not on file  Social History Narrative   Lives in [place], [place] with son.    Social Determinants of Health   Financial Resource Strain: Not on file  Food Insecurity: Not on file  Transportation Needs: Not on file  Physical Activity: Not on file  Stress: Not on file  Social Connections: Not on file     Family History: The patient's Family history is unknown by  patient.  ROS:   All other ROS reviewed and negative. Pertinent positives noted in the HPI.     EKGs/Labs/Other Studies Reviewed:   The following studies were personally reviewed by me today:  EKG:  EKG is ordered today.  The ekg ordered today demonstrates normal sinus rhythm heart rate 68, inferior infarct noted, anterolateral T wave inversions, and was personally reviewed by me.   TTE 02/23/2020  1. Left ventricular ejection fraction, by estimation, is 55 to 60%. The  left ventricle has  normal function. The left ventricle has no regional  wall motion abnormalities. There is moderate asymmetric left ventricular  hypertrophy of the basal-septal  segment. Left ventricular diastolic parameters were normal.   2. Right ventricular systolic function is normal. The right ventricular  size is normal. There is normal pulmonary artery systolic pressure.   3. The mitral valve is normal in structure. Moderate mitral valve  regurgitation. No evidence of mitral stenosis.   4. The aortic valve is grossly normal. There is moderate calcification of  the aortic valve. There is mild thickening of the aortic valve. Aortic  valve regurgitation is trivial. Mild to moderate aortic valve  sclerosis/calcification is present, without  any evidence of aortic stenosis.   5. The inferior vena cava is normal in size with greater than 50%  respiratory variability, suggesting right atrial pressure of 3 mmHg.   Recent Labs: 09/21/2021: BUN 29; Creatinine, Ser 1.79; Hemoglobin 10.2; Platelets 133; Potassium 4.5; Sodium 138   Recent Lipid Panel    Component Value Date/Time   CHOL 147 09/21/2021 0200   TRIG 116 09/21/2021 0200   HDL 52 09/21/2021 0200   CHOLHDL 2.8 09/21/2021 0200   VLDL 23 09/21/2021 0200   LDLCALC 72 09/21/2021 0200   LDLCALC 179 (H) 06/03/2020 1720    Physical Exam:   VS:  BP 124/60   Pulse 68   Ht '4\' 9"'$  (1.448 m)   Wt 127 lb 6.4 oz (57.8 kg)   SpO2 99%   BMI 27.57 kg/m    Wt  Readings from Last 3 Encounters:  07/10/22 127 lb 6.4 oz (57.8 kg)  10/24/21 129 lb 9.6 oz (58.8 kg)  09/20/21 119 lb 14.9 oz (54.4 kg)    General: Well nourished, well developed, in no acute distress Head: Atraumatic, normal size  Eyes: PEERLA, EOMI  Neck: Supple, no JVD Endocrine: No thryomegaly Cardiac: Normal S1, S2; RRR; no murmurs, rubs, or gallops Lungs: Expiratory wheezing noted Abd: Soft, nontender, no hepatomegaly  Ext: No edema, pulses 2+ Musculoskeletal: No deformities, BUE and BLE strength normal and equal Skin: Warm and dry, no rashes   Neuro: Alert and oriented to person, place, time, and situation, CNII-XII grossly intact, no focal deficits  Psych: Normal mood and affect   ASSESSMENT:   Alannys Punzel is a 77 y.o. female who presents for the following: 1. SOB (shortness of breath)   2. Other fatigue   3. Coronary artery disease involving coronary bypass graft of native heart without angina pectoris   4. PAD (peripheral artery disease) ([place])   5. Mixed hyperlipidemia   6. Renovascular hypertension     PLAN:   1. SOB (shortness of breath) 2. Other fatigue -Symptoms of shortness of breath and fatigue noted by the son.  Notable wheezing on examination.  Recommend a chest x-ray.  No overt signs of congestive heart failure.  I would like to check an echo as well as a stress test.  Her bypass surgery was nearly 10 years ago.  No fever or chills reported.  Chest x-ray will help [place].  She will follow-up based on the results of her testing  3. Coronary artery disease involving coronary bypass graft of native heart without angina pectoris 4. PAD (peripheral artery disease) ([place]) 5. Mixed hyperlipidemia -History of CAD status post CABG.  Also with PAD status post peripheral intervention.  Diabetes is uncontrolled.  They are working on this.  She is on aspirin and Plavix.  Will continue this for now.  She  was also on Crestor 40 mg daily.  Most recent LDL 72.  Will continue  this for now.  Echo and stress test as above.  6. Renovascular hypertension -No change to medications.  Continue amlodipine 10 mg daily, metoprolol tartrate 12.5 mg twice daily.  Shared Decision Making/Informed Consent The risks [chest pain, shortness of breath, cardiac arrhythmias, dizziness, blood pressure fluctuations, myocardial infarction, stroke/transient ischemic attack, nausea, vomiting, allergic reaction, radiation exposure, metallic taste sensation and life-threatening complications (estimated to be 1 in 10,000)], benefits (risk stratification, diagnosing coronary artery disease, treatment guidance) and alternatives of a nuclear stress test were discussed in detail with Ms. Shoff and she agrees to proceed.  Disposition: Return in about 6 months (around 01/08/2023).  Medication Adjustments/Labs and Tests Ordered: Current medicines are reviewed at length with the patient today.  Concerns regarding medicines are outlined above.  Orders Placed This Encounter  Procedures   DG Chest 2 View   Cardiac Stress Test: Informed Consent Details: Physician/Practitioner Attestation; Transcribe to consent form and obtain patient signature   MYOCARDIAL PERFUSION IMAGING   EKG 12-Lead   ECHOCARDIOGRAM COMPLETE   No orders of the defined types were placed in this encounter.   Patient Instructions  Medication Instructions:  The current medical regimen is effective;  continue present plan and medications.  *If you need a refill on your cardiac medications before your next appointment, please call your pharmacy*   Testing/Procedures:  Echocardiogram - Your physician has requested that you have an echocardiogram. Echocardiography is a painless test that uses sound waves to create images of your heart. It provides your doctor with information about the size and shape of your heart and how well your heart's chambers and valves are working. This procedure takes approximately one hour. There are no  restrictions for this procedure.   Chest xray - Your physician has requested that you have a chest xray, is a fast and painless imaging test that uses certain electromagnetic waves to create pictures of the structures in and around your chest. This test can help diagnose and monitor conditions such as pneumonia and other lung issues his will be done at 1)  [facility]  Imaging [address], [place]. If you should need to call them their phone number is [phone]    2)  [facility]: [place], [place], [place] [address] [phone]  Your physician has requested that you have a lexiscan myoview. For further information please visit [email or web address]. Please follow instruction sheet, as given.    Follow-Up: At [facility], you and your health needs are our priority.  As part of our continuing mission to provide you with exceptional heart care, we have created designated Provider Care Teams.  These Care Teams include your primary Cardiologist (physician) and Advanced Practice Providers (APPs -  Physician Assistants and Nurse Practitioners) who all work together to provide you with the care you need, when you need it.  We recommend signing up for the patient portal called "MyChart".  Sign up information is provided on this After Visit Summary.  MyChart is used to connect with patients for Virtual Visits (Telemedicine).  Patients are able to view lab/test results, encounter notes, upcoming appointments, etc.  Non-urgent messages can be sent to your provider as well.   To learn more about what you can do with MyChart, go to [email or web address].    Your next appointment:   6 month(s)  Provider:   Eleonore Chiquito, MD      Signed, Addison Naegeli. Audie Box, MD,  [facility]  [address] [place], [place] [address] [phone]  07/10/2022 11:35 AM

## 2022-07-10 ENCOUNTER — Encounter: Payer: Self-pay | Admitting: Cardiovascular Disease

## 2022-07-10 ENCOUNTER — Ambulatory Visit: Payer: Medicare Other | Attending: Cardiovascular Disease | Admitting: Cardiovascular Disease

## 2022-07-10 VITALS — BP 124/60 | HR 68 | Ht <= 58 in | Wt 127.4 lb

## 2022-07-10 DIAGNOSIS — I739 Peripheral vascular disease, unspecified: Secondary | ICD-10-CM | POA: Diagnosis present

## 2022-07-10 DIAGNOSIS — I150 Renovascular hypertension: Secondary | ICD-10-CM | POA: Diagnosis present

## 2022-07-10 DIAGNOSIS — I25810 Atherosclerosis of coronary artery bypass graft(s) without angina pectoris: Secondary | ICD-10-CM | POA: Insufficient documentation

## 2022-07-10 DIAGNOSIS — E782 Mixed hyperlipidemia: Secondary | ICD-10-CM | POA: Insufficient documentation

## 2022-07-10 DIAGNOSIS — R0602 Shortness of breath: Secondary | ICD-10-CM | POA: Diagnosis not present

## 2022-07-10 DIAGNOSIS — R5383 Other fatigue: Secondary | ICD-10-CM | POA: Insufficient documentation

## 2022-07-10 NOTE — Patient Instructions (Signed)
Medication Instructions:  The current medical regimen is effective;  continue present plan and medications.  *If you need a refill on your cardiac medications before your next appointment, please call your pharmacy*   Testing/Procedures:  Echocardiogram - Your physician has requested that you have an echocardiogram. Echocardiography is a painless test that uses sound waves to create images of your heart. It provides your doctor with information about the size and shape of your heart and how well your heart's chambers and valves are working. This procedure takes approximately one hour. There are no restrictions for this procedure.   Chest xray - Your physician has requested that you have a chest xray, is a fast and painless imaging test that uses certain electromagnetic waves to create pictures of the structures in and around your chest. This test can help diagnose and monitor conditions such as pneumonia and other lung issues his will be done at 1)  [facility]  Imaging [address], [place]. If you should need to call them their phone number is [phone]    2)  [facility]: [place], [place], [place] [address] [phone]  Your physician has requested that you have a lexiscan myoview. For further information please visit [email or web address]. Please follow instruction sheet, as given.    Follow-Up: At [facility], you and your health needs are our priority.  As part of our continuing mission to provide you with exceptional heart care, we have created designated Provider Care Teams.  These Care Teams include your primary Cardiologist (physician) and Advanced Practice Providers (APPs -  Physician Assistants and Nurse Practitioners) who all work together to provide you with the care you need, when you need it.  We recommend signing up for the patient portal called "MyChart".  Sign up information is provided on this After Visit Summary.  MyChart is used to connect with  patients for Virtual Visits (Telemedicine).  Patients are able to view lab/test results, encounter notes, upcoming appointments, etc.  Non-urgent messages can be sent to your provider as well.   To learn more about what you can do with MyChart, go to [email or web address].    Your next appointment:   6 month(s)  Provider:   Eleonore Chiquito, MD

## 2022-07-13 ENCOUNTER — Encounter (HOSPITAL_COMMUNITY): Payer: Medicare Other

## 2022-07-18 ENCOUNTER — Telehealth (HOSPITAL_COMMUNITY): Payer: Self-pay | Admitting: *Deleted

## 2022-07-18 ENCOUNTER — Other Ambulatory Visit: Payer: Self-pay | Admitting: Internal Medicine

## 2022-07-18 ENCOUNTER — Ambulatory Visit
Admission: RE | Admit: 2022-07-18 | Discharge: 2022-07-18 | Disposition: A | Discharge status: Home / Self Care | Payer: Medicare Other | Source: Ambulatory Visit | Attending: Internal Medicine | Admitting: Internal Medicine

## 2022-07-18 ENCOUNTER — Ambulatory Visit
Admission: RE | Admit: 2022-07-18 | Discharge: 2022-07-18 | Disposition: A | Discharge status: Home / Self Care | Payer: Medicare Other | Source: Ambulatory Visit | Attending: Cardiovascular Disease | Admitting: Cardiovascular Disease

## 2022-07-18 DIAGNOSIS — M25552 Pain in left hip: Secondary | ICD-10-CM

## 2022-07-18 DIAGNOSIS — R0602 Shortness of breath: Secondary | ICD-10-CM

## 2022-07-18 NOTE — Telephone Encounter (Signed)
Per DPR left detailed instructions on son's phone [phone] for MPI study scheduled on 07/20/22.

## 2022-07-20 ENCOUNTER — Encounter (HOSPITAL_COMMUNITY): Payer: Medicare Other

## 2022-08-07 ENCOUNTER — Telehealth (HOSPITAL_COMMUNITY): Payer: Self-pay | Admitting: *Deleted

## 2022-08-07 NOTE — Telephone Encounter (Signed)
Left Voicemail on Son's phone# with instructions for upcoming stress test.  Kirstie Peri

## 2022-08-08 ENCOUNTER — Ambulatory Visit (HOSPITAL_COMMUNITY): Payer: Medicare Other | Attending: Cardiovascular Disease

## 2022-08-08 DIAGNOSIS — R0602 Shortness of breath: Secondary | ICD-10-CM | POA: Insufficient documentation

## 2022-08-09 ENCOUNTER — Ambulatory Visit: Payer: Medicare Other | Admitting: Podiatry

## 2022-08-09 LAB — ECHOCARDIOGRAM COMPLETE
AR max vel: 1.06 cm2
AV Area VTI: 1.1 cm2
AV Area mean vel: 1.04 cm2
AV Mean grad: 11 mmHg
AV Peak grad: 20.6 mmHg
Ao pk vel: 2.27 m/s
Area-P 1/2: 3.23 cm2
S' Lateral: 2.6 cm

## 2022-08-15 ENCOUNTER — Telehealth (HOSPITAL_COMMUNITY): Payer: Self-pay | Admitting: Cardiovascular Disease

## 2022-08-15 ENCOUNTER — Ambulatory Visit (HOSPITAL_COMMUNITY): Payer: Medicare Other

## 2022-08-15 NOTE — Telephone Encounter (Signed)
Patients son called and cancelled Myoview and does not wish to reschedule. Order will be removed from the [facility]. Thank you

## 2022-08-17 ENCOUNTER — Other Ambulatory Visit: Payer: Self-pay | Admitting: Surgery

## 2022-08-29 ENCOUNTER — Ambulatory Visit: Payer: Medicare Other | Admitting: Podiatry

## 2022-12-31 NOTE — Progress Notes (Deleted)
Cardiology Office Note:   Date:  12/31/2022  NAME:  Amanda [name]    MRN: [id number] DOB:  [birth year]   PCP:  Fleet Contras, MD  Cardiologist:  None  Electrophysiologist:  None   Referring MD: Fleet Contras, MD   No chief complaint on file.   History of Present Illness:   Amanda [name] is a 77 y.o. female with a hx of CAD, PAD, DM, CKD IV, HTN, HLD who presents for follow-up.   Problem List CAD  -CABG  2013 2. PAD -L Tibioperoneal balloon angioplasty  -L popliteal/SFA angioplasty  3. 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 HTN 6. HLD -T chol 147, HDL 52, LDL 72, TG 116  Past Medical History: Past Medical History:  Diagnosis Date   Abdominal pain    Chest pain    Atypical   Chronic kidney disease    Coronary artery disease    Diabetes mellitus    GERD (gastroesophageal reflux disease)    Hyperlipidemia    Hypertension     Past Surgical History: Past Surgical History:  Procedure Laterality Date   ABDOMINAL AORTOGRAM W/LOWER EXTREMITY N/A 09/20/2021   Procedure: ABDOMINAL AORTOGRAM W/LOWER EXTREMITY;  Surgeon: Nada Libman, MD;  Location: [facility];  Service: Cardiovascular;  Laterality: N/A;   CARDIAC SURGERY     CORONARY ARTERY BYPASS GRAFT  10/04/2011   CORONARY ARTERY BYPASS GRAFT  10/05/2011   Procedure: CORONARY ARTERY BYPASS GRAFTING (CABG);  Surgeon: Kerin Perna, MD;  Location: [facility] OR;  Service: Open Heart Surgery;  Laterality: N/A;  TEE   LEFT HEART CATHETERIZATION WITH CORONARY ANGIOGRAM N/A 10/04/2011   Procedure: LEFT HEART CATHETERIZATION WITH CORONARY ANGIOGRAM;  Surgeon: Tonny Bollman, MD;  Location: [facility];  Service: Cardiovascular;  Laterality: N/A;   PERIPHERAL VASCULAR BALLOON ANGIOPLASTY  09/20/2021   Procedure: PERIPHERAL VASCULAR BALLOON ANGIOPLASTY;  Surgeon: Nada Libman, MD;  Location: [facility];  Service: Cardiovascular;;  Left SFA and PT    Current Medications: No outpatient medications have been  marked as taking for the 01/01/23 encounter (Appointment) with O'Neal, Ronnald Ramp, MD.     Allergies:    Patient has no known allergies.   Social History: Social History   Socioeconomic History   Marital status: Widowed    Spouse name: Not on file   Number of children: 7   Years of education: Not on file   Highest education level: Not on file  Occupational History   Not on file  Tobacco Use   Smoking status: Never   Smokeless tobacco: Never  Vaping Use   Vaping status: Never Used  Substance and Sexual Activity   Alcohol use: No   Drug use: No   Sexual activity: Not on file  Other Topics Concern   Not on file  Social History Narrative   Lives in [place], [place] with son.    Social Determinants of Health   Financial Resource Strain: Not on file  Food Insecurity: Not on file  Transportation Needs: Not on file  Physical Activity: Not on file  Stress: Not on file  Social Connections: Not on file     Family History: The patient's Family history is unknown by patient.  ROS:   All other ROS reviewed and negative. Pertinent positives noted in the HPI.     EKGs/Labs/Other Studies Reviewed:   The following studies were personally reviewed by me today:  EKG:  EKG is *** ordered today.  TTE 08/08/2022  1. Left ventricular ejection fraction, by estimation, is 60 to 65%. The  left ventricle has normal function. The left ventricle has no regional  wall motion abnormalities. There is mild concentric left ventricular  hypertrophy. Left ventricular diastolic  parameters are consistent with Grade I diastolic dysfunction (impaired  relaxation).   2. Right ventricular systolic function is mildly reduced. The right  ventricular size is normal.   3. Left atrial size was mild to moderately dilated.   4. The mitral valve is degenerative. Trivial mitral valve regurgitation.  No evidence of mitral stenosis.   5. The aortic valve is tricuspid. There is moderate calcification  of the  aortic valve. Aortic valve regurgitation is not visualized. Mild aortic  valve stenosis. Aortic valve area, by VTI measures 1.10 cm. Aortic valve  mean gradient measures 11.0 mmHg.  Aortic valve Vmax measures 2.27 m/s.   6. The inferior vena cava is normal in size with greater than 50%  respiratory variability, suggesting right atrial pressure of 3 mmHg.   Recent Labs: No results found for requested labs within last 365 days.   Recent Lipid Panel    Component Value Date/Time   CHOL 147 09/21/2021 0200   TRIG 116 09/21/2021 0200   HDL 52 09/21/2021 0200   CHOLHDL 2.8 09/21/2021 0200   VLDL 23 09/21/2021 0200   LDLCALC 72 09/21/2021 0200   LDLCALC 179 (H) 06/03/2020 1720    Physical Exam:   VS:  There were no vitals taken for this visit.   Wt Readings from Last 3 Encounters:  07/10/22 127 lb 6.4 oz (57.8 kg)  10/24/21 129 lb 9.6 oz (58.8 kg)  09/20/21 119 lb 14.9 oz (54.4 kg)    General: Well nourished, well developed, in no acute distress Head: Atraumatic, normal size  Eyes: PEERLA, EOMI  Neck: Supple, no JVD Endocrine: No thryomegaly Cardiac: Normal S1, S2; RRR; no murmurs, rubs, or gallops Lungs: Clear to auscultation bilaterally, no wheezing, rhonchi or rales  Abd: Soft, nontender, no hepatomegaly  Ext: No edema, pulses 2+ Musculoskeletal: No deformities, BUE and BLE strength normal and equal Skin: Warm and dry, no rashes   Neuro: Alert and oriented to person, place, time, and situation, CNII-XII grossly intact, no focal deficits  Psych: Normal mood and affect   ASSESSMENT:   Amanda [name] is a 77 y.o. female who presents for the following: No diagnosis found.  PLAN:   There are no diagnoses linked to this encounter.  {Are you ordering a CV Procedure (e.g. stress test, cath, DCCV, TEE, etc)?   Press F2        :[id number]}  Disposition: No follow-ups on file.  Medication Adjustments/Labs and Tests Ordered: Current medicines are reviewed at length  with the patient today.  Concerns regarding medicines are outlined above.  No orders of the defined types were placed in this encounter.  No orders of the defined types were placed in this encounter.  There are no Patient Instructions on file for this visit.   Time Spent with Patient: I have spent a total of *** minutes with patient reviewing hospital notes, telemetry, EKGs, labs and examining the patient as well as establishing an assessment and plan that was discussed with the patient.  > 50% of time was spent in direct patient care.  Signed, Lenna Gilford. Flora Lipps, MD, [facility]  [facility]  [address], [address] [place], [place] [address] [phone]  12/31/2022 7:50 PM

## 2023-01-01 ENCOUNTER — Ambulatory Visit: Payer: Medicare Other | Attending: Cardiovascular Disease | Admitting: Cardiovascular Disease

## 2023-01-01 DIAGNOSIS — R0602 Shortness of breath: Secondary | ICD-10-CM

## 2023-01-01 DIAGNOSIS — I150 Renovascular hypertension: Secondary | ICD-10-CM

## 2023-01-01 DIAGNOSIS — E782 Mixed hyperlipidemia: Secondary | ICD-10-CM

## 2023-01-01 DIAGNOSIS — I25810 Atherosclerosis of coronary artery bypass graft(s) without angina pectoris: Secondary | ICD-10-CM

## 2023-01-01 DIAGNOSIS — I739 Peripheral vascular disease, unspecified: Secondary | ICD-10-CM

## 2023-08-07 ENCOUNTER — Other Ambulatory Visit: Payer: Self-pay | Admitting: Physician Assistant
